# Patient Record
Sex: Female | Born: 1935
Health system: Southern US, Community
[De-identification: ages and names within clinical notes are randomized; demographics above are authoritative.]

## PROBLEM LIST (undated history)

## (undated) DIAGNOSIS — I4891 Unspecified atrial fibrillation: Secondary | ICD-10-CM

## (undated) DIAGNOSIS — I499 Cardiac arrhythmia, unspecified: Secondary | ICD-10-CM

## (undated) DIAGNOSIS — M1990 Unspecified osteoarthritis, unspecified site: Secondary | ICD-10-CM

## (undated) DIAGNOSIS — I639 Cerebral infarction, unspecified: Secondary | ICD-10-CM

## (undated) DIAGNOSIS — I10 Essential (primary) hypertension: Secondary | ICD-10-CM

## (undated) HISTORY — DX: Essential (primary) hypertension: I10

## (undated) HISTORY — PX: APPENDECTOMY: SHX54

## (undated) HISTORY — PX: CHOLECYSTECTOMY: SHX55

---

## 2001-05-15 ENCOUNTER — Encounter: Payer: Self-pay | Admitting: Vascular Surgery

## 2001-05-20 ENCOUNTER — Encounter (INDEPENDENT_AMBULATORY_CARE_PROVIDER_SITE_OTHER): Payer: Self-pay | Admitting: Specialist

## 2001-05-20 ENCOUNTER — Ambulatory Visit (HOSPITAL_COMMUNITY): Admission: RE | Admit: 2001-05-20 | Discharge: 2001-05-20 | Payer: Self-pay | Admitting: Vascular Surgery

## 2001-07-22 ENCOUNTER — Ambulatory Visit (HOSPITAL_COMMUNITY): Admission: RE | Admit: 2001-07-22 | Discharge: 2001-07-22 | Payer: Self-pay | Admitting: Internal Medicine

## 2001-07-22 ENCOUNTER — Encounter: Payer: Self-pay | Admitting: Internal Medicine

## 2001-08-23 ENCOUNTER — Encounter: Payer: Self-pay | Admitting: Emergency Medicine

## 2001-08-23 ENCOUNTER — Inpatient Hospital Stay (HOSPITAL_COMMUNITY): Admission: EM | Admit: 2001-08-23 | Discharge: 2001-08-25 | Payer: Self-pay | Admitting: Emergency Medicine

## 2002-02-12 ENCOUNTER — Ambulatory Visit (HOSPITAL_COMMUNITY): Admission: RE | Admit: 2002-02-12 | Discharge: 2002-02-12 | Payer: Self-pay | Admitting: Unknown Physician Specialty

## 2002-02-12 ENCOUNTER — Encounter: Payer: Self-pay | Admitting: Internal Medicine

## 2002-04-22 ENCOUNTER — Emergency Department (HOSPITAL_COMMUNITY): Admission: EM | Admit: 2002-04-22 | Discharge: 2002-04-22 | Payer: Self-pay | Admitting: Emergency Medicine

## 2002-09-22 ENCOUNTER — Encounter: Payer: Self-pay | Admitting: Internal Medicine

## 2002-09-22 ENCOUNTER — Ambulatory Visit (HOSPITAL_COMMUNITY): Admission: RE | Admit: 2002-09-22 | Discharge: 2002-09-22 | Payer: Self-pay | Admitting: Internal Medicine

## 2004-01-04 ENCOUNTER — Ambulatory Visit (HOSPITAL_COMMUNITY): Admission: RE | Admit: 2004-01-04 | Discharge: 2004-01-04 | Payer: Self-pay | Admitting: Internal Medicine

## 2004-06-13 ENCOUNTER — Ambulatory Visit (HOSPITAL_COMMUNITY): Admission: RE | Admit: 2004-06-13 | Discharge: 2004-06-13 | Payer: Self-pay | Admitting: Internal Medicine

## 2005-04-16 ENCOUNTER — Ambulatory Visit (HOSPITAL_COMMUNITY): Admission: RE | Admit: 2005-04-16 | Discharge: 2005-04-16 | Payer: Self-pay | Admitting: Internal Medicine

## 2006-04-08 ENCOUNTER — Ambulatory Visit (HOSPITAL_COMMUNITY): Admission: RE | Admit: 2006-04-08 | Discharge: 2006-04-08 | Payer: Self-pay | Admitting: Internal Medicine

## 2006-10-14 ENCOUNTER — Ambulatory Visit (HOSPITAL_COMMUNITY): Admission: RE | Admit: 2006-10-14 | Discharge: 2006-10-14 | Payer: Self-pay | Admitting: Orthopaedic Surgery

## 2007-05-05 ENCOUNTER — Ambulatory Visit (HOSPITAL_COMMUNITY): Admission: RE | Admit: 2007-05-05 | Discharge: 2007-05-05 | Payer: Self-pay | Admitting: Internal Medicine

## 2008-04-21 ENCOUNTER — Ambulatory Visit (HOSPITAL_COMMUNITY): Admission: RE | Admit: 2008-04-21 | Discharge: 2008-04-21 | Payer: Self-pay | Admitting: Internal Medicine

## 2009-05-10 ENCOUNTER — Ambulatory Visit (HOSPITAL_COMMUNITY): Admission: RE | Admit: 2009-05-10 | Discharge: 2009-05-10 | Payer: Self-pay | Admitting: Internal Medicine

## 2010-05-11 ENCOUNTER — Ambulatory Visit (HOSPITAL_COMMUNITY): Admission: RE | Admit: 2010-05-11 | Discharge: 2010-05-11 | Payer: Self-pay | Admitting: Internal Medicine

## 2011-02-22 NOTE — Procedures (Signed)
[facility]  Patient:    Sheila Jordan, Sheila Jordan Visit Number: [id number] MRN: [id number]          Service Type: EMS Location: ED Attending Physician:  Hilario Quarry Dictated by:   Kari Baars, M.D. Proc. Date: 04/22/02 Admit Date:  04/22/2002 Discharge Date: 04/22/2002                            EKG Interpretations  TIME/DATE:  1729, April 22, 2002  DESCRIPTION:  The rhythm is sinus rhythm, rate in the 80s.  There are small Q-waves inferiorly and laterally which may indicate a previous inferolateral myocardial infarction.  Clinical correlation is suggested.  Otherwise, normal electrocardiogram. Dictated by:   Kari Baars, M.D. Attending Physician:  Hilario Quarry DD:  04/24/02 TD:  04/28/02 Job: [id number]

## 2011-02-22 NOTE — Discharge Summary (Signed)
[facility]  Patient:    Sheila Jordan, Sheila Jordan Visit Number: [id number] MRN: [id number]          Service Type: MED Location: 2A A219 01 Attending Physician:  Renne Musca Dictated by:   Renne Musca, M.D. Admit Date:  08/23/2001 Discharge Date: 08/25/2001                             Discharge Summary  DISCHARGE DIAGNOSES: 1. Community-acquired pneumonia with hypoxemia. 2. Chronic venous stasis. 3. Costochondritis resolved.  DISPOSITION:  The patient is discharged to home.  MEDICATIONS: 1. Zithromax 250 q.d. for 5 more days. 2. Premarin 0.625 q.d. 3. Multivitamin, Vitamin C and Vitamin E.  HOSPITAL COURSE:  The patient is a pleasant 77 year old white female in good health who had noted a dry cough several days prior to admission.  She presented to the emergency room complaining of high fever, and generally feeling poorly.  She denied short of breath but did note some anterior chest pain.  She was initially started on Rocephin, remained febrile through the first 36 hours and then defervesced.  She underwent a venogram which was negative for DVT and apparently a CT of the chest negative for PE.  Chest x-ray revealed some basilar atelectasis but no obvious infiltrates or effusions.  Cardiac enzymes remained normal.  Her initial pO2 was 60 on room air with pC022 of 32. Followup blood casts on room air was 74 with a Pc022 of 35.  Her fevers defervesced.  She felt quite well and was ready for discharge on the second hospital day.  Physical examination at the time of admission did show fine rales in the right base but these have resolved as did her chest pain which was musculoskeletal in nature.  PHYSICAL EXAMINATION:  GENERAL:  Awake, white female in no distress.  VITAL SIGNS:  Temperature is 97.  Blood pressure is 127/80, pulse is 74, her abdomen is obese, soft, nontender.  LUNGS:  Clear to auscultation anterior and  posteriorly.  HEART:  Regular rate and rhythm no murmur, gallop, or rub.  No JVD is noted.  EXTREMITIES:  Without clubbing, cyanosis, or edema.  She has chronic stasis changes particularly at the left lower extremity and some deformity related to scarring and a previous ankle fracture.  NEUROLOGIC:  Completely intact.  DISPOSITION:  The patient is discharged to home in improved condition with the plan as noted above.  She is to follow up in the office in 2-3 weeks.  She is also to obtain a bone density study. Dictated by:   Renne Musca, M.D. Attending Physician:  Renne Musca DD:  08/25/01 TD:  08/25/01 Job: [id number]

## 2011-02-22 NOTE — Op Note (Signed)
NAME:  Sheila Jordan, Sheila Jordan                        ACCOUNT NO.:  [id number]   MEDICAL RECORD NO.:  [id number]                   PATIENT TYPE:  AMB   LOCATION:  DAY                                  FACILITY:  [facility]   PHYSICIAN:  R. Roetta Sessions, M.D.              DATE OF BIRTH:  1928/07/21   DATE OF PROCEDURE:  06/13/2004  DATE OF DISCHARGE:                                 OPERATIVE REPORT   PROCEDURE:  Screening colonoscopy.   INDICATIONS FOR PROCEDURE:  The patient is a 77 year old lady sent at the  courtesy of Dr. Ouida Sills for colorectal cancer. She has never had a colonoscopy  or other imaging modality. No GI symptoms. No family history of colorectal  neoplasia. Colonoscopy is now being done as a standard screening maneuver.  This approach has been discussed with the patient at length. Potential  risks, benefits, and alternatives have been reviewed and questions answered.  She is agreeable. Please see my hand written H&P for more information.   PROCEDURE NOTE:  O2 saturation, blood pressure, pulse, and respirations  monitored throughout the entirety of the procedure. Conscious sedation with  Versed 3 mg IV and Demerol 75 mg IV in divided doses.   INSTRUMENT:  Olympus video chip system.   FINDINGS:  Digital rectal examination revealed no abnormalities.   ENDOSCOPIC FINDINGS:  Prep was good.   Rectum:  Examination of the rectal mucosa including retroflexed view of anal  verge revealed no abnormalities.   Colon:  Colonic mucosa was surveyed from the rectosigmoid junction through  the left, transverse, and right colon to area of the appendiceal orifice,  ileocecal valve, and cecum. These structures were well seen and photographed  for the record. Olympus video scope was slowly withdrawn, and all previously  mentioned mucosal surfaces were again seen. The patient had a tortuous,  elongated colon recurring a number of maneuvers including various  applications of external abdominal  pressure and changing of the patient's  position to reach the cecum. There were some early diverticular changes of  the left colon. Otherwise, colonic mucosa appeared normal. The patient  tolerated the procedure well and was reactive to endoscopy.   IMPRESSION:  1.  Normal rectum.  2.  Minimal left sided diverticular changes, otherwise normal colon, aside      from tortuosity and elongation.   RECOMMENDATIONS:  Consider repeat colonoscopy in 10 years if she remains in  good health. Follow up with Dr. Ouida Sills as needed.      ___________________________________________                                            Jonathon Bellows, M.D.   RMR/MEDQ  D:  06/13/2004  T:  06/13/2004  Job:  [id number]   cc:   Kingsley Callander. Ouida Sills, M.D.  [address]  [place]  [place] [address]  Fax: [phone]
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
## 2011-02-22 NOTE — Op Note (Signed)
Buehrer, Reanna   [place]                                  MRN:  [id number]           Operative Report                                  ATT:  Quita Skye. Hart Rochester, M.D.  Procedure:  05/20/01             DICT: PREOPERATIVE DIAGNOSIS:  Severe varicose veins with venous hypertension, left leg, and venous stasis ulceration.  POSTOPERATIVE DIAGNOSIS:  Severe varicose veins with venous hypertension, left leg, and venous stasis ulceration.  OPERATION:  Ligation and stripping of greater saphenous vein, left leg and excision of multiple varicosities.  SURGEON:  Quita Skye. Hart Rochester, M.D.  FIRST ASSISTANT:  Gina H. Collins, P.A.-C.  ANESTHESIA:  General endotracheal.  DESCRIPTION OF PROCEDURE:  The patient was taken to the operating room and placed in the supine position, at which time satisfactory general endotracheal anesthesia was administered after the varicosities in the left leg had been marked with the patient in the upright position.  There was a large healed stasis ulceration with hyperpigmentation proximal to the medial malleolus, with the skin changes  measuring about 6-7 cm in diameter, but no active infection or ulceration.  The leg was prepped with Betadine solution and draped in a routine sterile manner.  An incision was made initially in the saphenofemoral junction with an oblique incision in the inguinal crease.  The saphenous vein was dissected free up to the saphenofemoral junction.  The vein was ligated with 2-0 silk ties.  Branches were ligated with hemoclips.  An intraluminal stripper was passed retrograde from the proximal saphenous vein until it was palpated in the mid calf, where it would no longer proceed.  A short incision was made at this point and the greater saphenous vein was exposed.  The distal vein was removed by avulsion technique down to the area just above the active area of hyperpigmentation using another  small counter incision.  Care was taken not to injure the saphenous nerve.  The small stripper head was then positioned in the inguinal region and the marked varicosities were all removed using stab wounds with hemostats and dissection and avulsion techniques.  When this had all been completed, the  leg was wrapped with 6-inch Ace.  The patient was placed in Trendelenburg and the vein stripped from proximal to distal in a routine fashion.  Adequate compression was applied for five minutes.  The wound was closed with Vicryl in a subcuticular fashion with Steri-Strips.  Sterile dressings were applied consisting of 4 x 4, Webril and Ace wrap.  The patient was taken to the recovery room in satisfactory condition. DD:  05/20/01 TD:  05/20/01 Job: [id number]

## 2011-05-21 ENCOUNTER — Other Ambulatory Visit (HOSPITAL_COMMUNITY): Payer: Self-pay | Admitting: Internal Medicine

## 2011-05-21 DIAGNOSIS — Z1239 Encounter for other screening for malignant neoplasm of breast: Secondary | ICD-10-CM

## 2011-05-21 DIAGNOSIS — Z139 Encounter for screening, unspecified: Secondary | ICD-10-CM

## 2011-05-22 ENCOUNTER — Other Ambulatory Visit (HOSPITAL_COMMUNITY): Payer: Self-pay | Admitting: Internal Medicine

## 2011-05-22 DIAGNOSIS — Z139 Encounter for screening, unspecified: Secondary | ICD-10-CM

## 2011-05-24 ENCOUNTER — Ambulatory Visit (HOSPITAL_COMMUNITY)
Admission: RE | Admit: 2011-05-24 | Discharge: 2011-05-24 | Disposition: A | Discharge status: Home / Self Care | Payer: Medicare Other | Source: Ambulatory Visit | Attending: Internal Medicine | Admitting: Internal Medicine

## 2011-05-24 ENCOUNTER — Ambulatory Visit (HOSPITAL_COMMUNITY): Payer: Medicare Other

## 2011-05-24 DIAGNOSIS — Z139 Encounter for screening, unspecified: Secondary | ICD-10-CM

## 2011-05-24 DIAGNOSIS — Z1231 Encounter for screening mammogram for malignant neoplasm of breast: Secondary | ICD-10-CM | POA: Insufficient documentation

## 2011-07-01 ENCOUNTER — Other Ambulatory Visit (HOSPITAL_COMMUNITY): Payer: Self-pay | Admitting: Internal Medicine

## 2011-07-01 DIAGNOSIS — M8580 Other specified disorders of bone density and structure, unspecified site: Secondary | ICD-10-CM

## 2011-07-01 DIAGNOSIS — Z8781 Personal history of (healed) traumatic fracture: Secondary | ICD-10-CM

## 2011-07-04 ENCOUNTER — Ambulatory Visit (HOSPITAL_COMMUNITY)
Admission: RE | Admit: 2011-07-04 | Discharge: 2011-07-04 | Disposition: A | Discharge status: Home / Self Care | Payer: Medicare Other | Source: Ambulatory Visit | Attending: Internal Medicine | Admitting: Internal Medicine

## 2011-07-04 DIAGNOSIS — M899 Disorder of bone, unspecified: Secondary | ICD-10-CM | POA: Insufficient documentation

## 2011-07-04 DIAGNOSIS — M8580 Other specified disorders of bone density and structure, unspecified site: Secondary | ICD-10-CM

## 2011-07-04 DIAGNOSIS — Z8781 Personal history of (healed) traumatic fracture: Secondary | ICD-10-CM

## 2011-07-04 DIAGNOSIS — Z780 Asymptomatic menopausal state: Secondary | ICD-10-CM | POA: Insufficient documentation

## 2012-05-11 ENCOUNTER — Other Ambulatory Visit (HOSPITAL_COMMUNITY): Payer: Self-pay | Admitting: Internal Medicine

## 2012-05-11 DIAGNOSIS — Z139 Encounter for screening, unspecified: Secondary | ICD-10-CM

## 2012-05-26 ENCOUNTER — Ambulatory Visit (HOSPITAL_COMMUNITY)
Admission: RE | Admit: 2012-05-26 | Discharge: 2012-05-26 | Disposition: A | Discharge status: Home / Self Care | Payer: Medicare Other | Source: Ambulatory Visit | Attending: Internal Medicine | Admitting: Internal Medicine

## 2012-05-26 ENCOUNTER — Ambulatory Visit (HOSPITAL_COMMUNITY): Payer: Medicare Other

## 2012-05-26 DIAGNOSIS — Z139 Encounter for screening, unspecified: Secondary | ICD-10-CM

## 2012-05-26 DIAGNOSIS — Z1231 Encounter for screening mammogram for malignant neoplasm of breast: Secondary | ICD-10-CM | POA: Insufficient documentation

## 2013-06-16 ENCOUNTER — Other Ambulatory Visit (HOSPITAL_COMMUNITY): Payer: Self-pay | Admitting: Internal Medicine

## 2013-06-16 DIAGNOSIS — Z139 Encounter for screening, unspecified: Secondary | ICD-10-CM

## 2013-06-18 ENCOUNTER — Ambulatory Visit (HOSPITAL_COMMUNITY)
Admission: RE | Admit: 2013-06-18 | Discharge: 2013-06-18 | Disposition: A | Discharge status: Home / Self Care | Payer: Medicare Other | Source: Ambulatory Visit | Attending: Internal Medicine | Admitting: Internal Medicine

## 2013-06-18 DIAGNOSIS — Z139 Encounter for screening, unspecified: Secondary | ICD-10-CM

## 2013-06-18 DIAGNOSIS — Z1231 Encounter for screening mammogram for malignant neoplasm of breast: Secondary | ICD-10-CM | POA: Insufficient documentation

## 2013-08-26 ENCOUNTER — Other Ambulatory Visit (HOSPITAL_COMMUNITY): Payer: Self-pay | Admitting: Internal Medicine

## 2013-08-26 ENCOUNTER — Ambulatory Visit (HOSPITAL_COMMUNITY)
Admission: RE | Admit: 2013-08-26 | Discharge: 2013-08-26 | Disposition: A | Discharge status: Home / Self Care | Payer: Medicare Other | Source: Ambulatory Visit | Attending: Internal Medicine | Admitting: Internal Medicine

## 2013-08-26 DIAGNOSIS — R059 Cough, unspecified: Secondary | ICD-10-CM

## 2013-08-26 DIAGNOSIS — R05 Cough: Secondary | ICD-10-CM | POA: Insufficient documentation

## 2014-09-20 ENCOUNTER — Other Ambulatory Visit (HOSPITAL_COMMUNITY): Payer: Self-pay | Admitting: Internal Medicine

## 2014-09-20 DIAGNOSIS — Z1231 Encounter for screening mammogram for malignant neoplasm of breast: Secondary | ICD-10-CM

## 2014-09-22 ENCOUNTER — Ambulatory Visit (HOSPITAL_COMMUNITY)
Admission: RE | Admit: 2014-09-22 | Discharge: 2014-09-22 | Disposition: A | Discharge status: Home / Self Care | Payer: Medicare Other | Source: Ambulatory Visit | Attending: Internal Medicine | Admitting: Internal Medicine

## 2014-09-22 DIAGNOSIS — Z1231 Encounter for screening mammogram for malignant neoplasm of breast: Secondary | ICD-10-CM | POA: Diagnosis not present

## 2015-09-05 ENCOUNTER — Other Ambulatory Visit (HOSPITAL_COMMUNITY): Payer: Self-pay | Admitting: Internal Medicine

## 2015-09-05 DIAGNOSIS — Z1231 Encounter for screening mammogram for malignant neoplasm of breast: Secondary | ICD-10-CM

## 2015-09-07 ENCOUNTER — Ambulatory Visit (HOSPITAL_COMMUNITY)
Admission: RE | Admit: 2015-09-07 | Discharge: 2015-09-07 | Disposition: A | Discharge status: Home / Self Care | Payer: Medicare Other | Source: Ambulatory Visit | Attending: Internal Medicine | Admitting: Internal Medicine

## 2015-09-07 DIAGNOSIS — Z1231 Encounter for screening mammogram for malignant neoplasm of breast: Secondary | ICD-10-CM | POA: Insufficient documentation

## 2016-09-05 ENCOUNTER — Other Ambulatory Visit (HOSPITAL_COMMUNITY): Payer: Self-pay | Admitting: Internal Medicine

## 2016-09-05 DIAGNOSIS — Z1231 Encounter for screening mammogram for malignant neoplasm of breast: Secondary | ICD-10-CM

## 2016-09-18 ENCOUNTER — Ambulatory Visit (HOSPITAL_COMMUNITY)
Admission: RE | Admit: 2016-09-18 | Discharge: 2016-09-18 | Disposition: A | Discharge status: Home / Self Care | Payer: Medicare Other | Source: Ambulatory Visit | Attending: Internal Medicine | Admitting: Internal Medicine

## 2016-09-18 DIAGNOSIS — Z1231 Encounter for screening mammogram for malignant neoplasm of breast: Secondary | ICD-10-CM | POA: Diagnosis not present

## 2017-08-27 ENCOUNTER — Encounter (HOSPITAL_COMMUNITY): Payer: Self-pay

## 2017-08-27 ENCOUNTER — Emergency Department (HOSPITAL_COMMUNITY): Payer: Medicare Other

## 2017-08-27 ENCOUNTER — Emergency Department (HOSPITAL_COMMUNITY)
Admission: EM | Admit: 2017-08-27 | Discharge: 2017-08-27 | Disposition: A | Discharge status: Home / Self Care | Payer: Medicare Other | Attending: Emergency Medicine | Admitting: Emergency Medicine

## 2017-08-27 DIAGNOSIS — K449 Diaphragmatic hernia without obstruction or gangrene: Secondary | ICD-10-CM | POA: Diagnosis not present

## 2017-08-27 DIAGNOSIS — Z79899 Other long term (current) drug therapy: Secondary | ICD-10-CM | POA: Diagnosis not present

## 2017-08-27 DIAGNOSIS — R0789 Other chest pain: Secondary | ICD-10-CM | POA: Diagnosis not present

## 2017-08-27 HISTORY — DX: Unspecified osteoarthritis, unspecified site: M19.90

## 2017-08-27 LAB — CBC WITH DIFFERENTIAL/PLATELET
Basophils Absolute: 0 10*3/uL (ref 0.0–0.1)
Basophils Relative: 0 %
Eosinophils Absolute: 0.1 10*3/uL (ref 0.0–0.7)
Eosinophils Relative: 2 %
HCT: 38.2 % (ref 36.0–46.0)
HEMOGLOBIN: 12.3 g/dL (ref 12.0–15.0)
Lymphocytes Relative: 17 %
Lymphs Abs: 1.2 10*3/uL (ref 0.7–4.0)
MCH: 28.8 pg (ref 26.0–34.0)
MCHC: 32.2 g/dL (ref 30.0–36.0)
MCV: 89.5 fL (ref 78.0–100.0)
Monocytes Absolute: 0.5 10*3/uL (ref 0.1–1.0)
Monocytes Relative: 7 %
NEUTROS ABS: 5.1 10*3/uL (ref 1.7–7.7)
NEUTROS PCT: 74 %
Platelets: 235 10*3/uL (ref 150–400)
RBC: 4.27 MIL/uL (ref 3.87–5.11)
RDW: 13.7 % (ref 11.5–15.5)
WBC: 6.8 10*3/uL (ref 4.0–10.5)

## 2017-08-27 LAB — COMPREHENSIVE METABOLIC PANEL
ALK PHOS: 81 U/L (ref 38–126)
ALT: 12 U/L — AB (ref 14–54)
AST: 18 U/L (ref 15–41)
Albumin: 4 g/dL (ref 3.5–5.0)
Anion gap: 8 (ref 5–15)
BILIRUBIN TOTAL: 0.8 mg/dL (ref 0.3–1.2)
BUN: 25 mg/dL — ABNORMAL HIGH (ref 6–20)
CALCIUM: 8.8 mg/dL — AB (ref 8.9–10.3)
CO2: 26 mmol/L (ref 22–32)
CREATININE: 1.14 mg/dL — AB (ref 0.44–1.00)
Chloride: 104 mmol/L (ref 101–111)
GFR, EST AFRICAN AMERICAN: 49 mL/min — AB (ref >60)
GFR, EST NON AFRICAN AMERICAN: 42 mL/min — AB (ref >60)
Glucose, Bld: 122 mg/dL — ABNORMAL HIGH (ref 65–99)
Potassium: 3.3 mmol/L — ABNORMAL LOW (ref 3.5–5.1)
Sodium: 138 mmol/L (ref 135–145)
TOTAL PROTEIN: 7 g/dL (ref 6.5–8.1)

## 2017-08-27 LAB — D-DIMER, QUANTITATIVE: D-Dimer, Quant: 1.5 ug/mL-FEU — ABNORMAL HIGH (ref 0.00–0.50)

## 2017-08-27 LAB — TROPONIN I

## 2017-08-27 MED ORDER — HYDROCODONE-ACETAMINOPHEN 5-325 MG PO TABS
1.0000 | ORAL_TABLET | Freq: Once | ORAL | Status: AC
  Administered 2017-08-27: 1 via ORAL
  Filled 2017-08-27: qty 1

## 2017-08-27 MED ORDER — FENTANYL CITRATE (PF) 100 MCG/2ML IJ SOLN
50.0000 ug | Freq: Once | INTRAMUSCULAR | Status: AC
  Administered 2017-08-27: 50 ug via INTRAVENOUS
  Filled 2017-08-27: qty 2

## 2017-08-27 MED ORDER — LANSOPRAZOLE 15 MG PO CPDR: 15.0000 mg | DELAYED_RELEASE_CAPSULE | Freq: Every day | ORAL | 0 refills | Status: DC

## 2017-08-27 MED ORDER — OXYCODONE-ACETAMINOPHEN 5-325 MG PO TABS: 1.0000 | ORAL_TABLET | ORAL | 0 refills | Status: DC | PRN

## 2017-08-27 MED ORDER — FENTANYL CITRATE (PF) 100 MCG/2ML IJ SOLN
50.0000 ug | Freq: Once | INTRAMUSCULAR | Status: AC
  Administered 2017-08-27: 50 ug via INTRAVENOUS

## 2017-08-27 MED ORDER — OXYCODONE-ACETAMINOPHEN 5-325 MG PO TABS
1.0000 | ORAL_TABLET | Freq: Once | ORAL | Status: AC
  Administered 2017-08-27: 1 via ORAL
  Filled 2017-08-27: qty 1

## 2017-08-27 MED ORDER — OXYCODONE-ACETAMINOPHEN 5-325 MG PO TABS: 2.0000 | ORAL_TABLET | Freq: Four times a day (QID) | ORAL | 0 refills | Status: DC | PRN

## 2017-08-27 MED ORDER — IOPAMIDOL (ISOVUE-370) INJECTION 76%
75.0000 mL | Freq: Once | INTRAVENOUS | Status: AC | PRN
  Administered 2017-08-27: 21:00:00 via INTRAVENOUS

## 2017-08-27 MED ORDER — GI COCKTAIL ~~LOC~~
30.0000 mL | Freq: Once | ORAL | Status: AC
  Administered 2017-08-27: 30 mL via ORAL
  Filled 2017-08-27: qty 30

## 2017-08-27 NOTE — ED Notes (Signed)
Pt was taken home by family. Pt did not drive.

## 2017-08-27 NOTE — ED Notes (Signed)
Pt back from CT

## 2017-08-27 NOTE — ED Provider Notes (Signed)
[facility] EMERGENCY DEPARTMENT Provider Note   CSN: [id number] Arrival date & time: 08/27/17  1723     History   Chief Complaint Chief Complaint  Patient presents with  . Chest Pain    HPI Sheila Jordan is a 83 y.o. female.  HPI Yesterday she was starting her lawnmower and heard a popping sound in her chest.  No immediate pain.  States that she has been active all day today.  Around noon began having inferior sternal pain.  Describes the pain is sharp and worse with movement and deep breathing.  Denies cough or shortness of breath.  No fever or chills.  She has chronic lower extremity swelling which is unchanged.  Denies any abdominal pain.  No nausea or vomiting. Past Medical History:  Diagnosis Date  . Arthritis     There are no active problems to display for this patient.   History reviewed. No pertinent surgical history.  OB History    No data available       Home Medications    Prior to Admission medications   Medication Sig Start Date End Date Taking? Authorizing Provider  chlorthalidone (HYGROTON) 25 MG tablet Take 25 mg by mouth every morning.   Yes [provider]  lansoprazole (PREVACID) 15 MG capsule Take 1 capsule (15 mg total) by mouth daily at 12 noon. 08/27/17   Loren RacerYelverton, Namon Villarin, MD  oxyCODONE-acetaminophen (PERCOCET) 5-325 MG tablet Take 2 tablets by mouth every 6 (six) hours as needed for severe pain. 08/27/17   Loren RacerYelverton, Altair Appenzeller, MD    Family History No family history on file.  Social History Social History   Tobacco Use  . Smoking status: Not on file  Substance Use Topics  . Alcohol use: No    Frequency: Never  . Drug use: No     Allergies   Patient has no known allergies.   Review of Systems Review of Systems  Constitutional: Negative for chills and fever.  Respiratory: Negative for cough and shortness of breath.   Cardiovascular: Positive for chest pain and leg swelling. Negative for palpitations.    Gastrointestinal: Negative for abdominal pain, constipation, diarrhea, nausea and vomiting.  Musculoskeletal: Negative for back pain, myalgias, neck pain and neck stiffness.  Skin: Negative for rash and wound.  Neurological: Negative for dizziness, weakness, light-headedness, numbness and headaches.  All other systems reviewed and are negative.    Physical Exam Updated Vital Signs BP (!) 178/104   Pulse 81   Temp 97.8 F (36.6 C) (Oral)   Resp (!) 21   Ht 5\' 4"  (1.626 m)   Wt 79.8 kg (176 lb)   SpO2 96%   BMI 30.21 kg/m   Physical Exam  Constitutional: She is oriented to person, place, and time. She appears well-developed and well-nourished.  Non-toxic appearance. She does not appear ill. She appears distressed.  HENT:  Head: Normocephalic and atraumatic.  Mouth/Throat: Oropharynx is clear and moist.  Eyes: EOM are normal. Pupils are equal, round, and reactive to light.  Neck: Normal range of motion. Neck supple.  Cardiovascular: Normal rate, regular rhythm and intact distal pulses.  Pulmonary/Chest: Effort normal and breath sounds normal.  Abdominal: Soft. Bowel sounds are normal. There is no tenderness. There is no rebound and no guarding.  Musculoskeletal: Normal range of motion. She exhibits no tenderness.       Right lower leg: She exhibits edema. She exhibits no tenderness.       Left lower leg: She exhibits no  tenderness and no edema.  Patient has tenderness to palpation over the inferior sternum and the right sternal border.  There is no crepitance or deformity.  She has right lower extremity swelling compared to left.  No calf tenderness.  Distal pulses are 2+.  Neurological: She is alert and oriented to person, place, and time.  Moves all extremities without focal deficit.  Sensation fully intact.  Skin: Skin is warm and dry. Capillary refill takes less than 2 seconds. No rash noted. No erythema.  Psychiatric: She has a normal mood and affect. Her behavior is normal.   Nursing note and vitals reviewed.    ED Treatments / Results  Labs (all labs ordered are listed, but only abnormal results are displayed) Labs Reviewed  COMPREHENSIVE METABOLIC PANEL - Abnormal; Notable for the following components:      Result Value   Potassium 3.3 (*)    Glucose, Bld 122 (*)    BUN 25 (*)    Creatinine, Ser 1.14 (*)    Calcium 8.8 (*)    ALT 12 (*)    GFR calc non Af Amer 42 (*)    GFR calc Af Amer 49 (*)    All other components within normal limits  D-DIMER, QUANTITATIVE (NOT AT [facility]) - Abnormal; Notable for the following components:   D-Dimer, Quant 1.50 (*)    All other components within normal limits  CBC WITH DIFFERENTIAL/PLATELET  TROPONIN I    EKG  EKG Interpretation None       Radiology Dg Chest 2 View  Result Date: 08/27/2017 CLINICAL DATA:  Sternal chest pain. EXAM: CHEST  2 VIEW COMPARISON:  08/26/2013 FINDINGS: Heart size upper normal, likely normal for technique. Mild tortuosity of the aorta, advanced aortic atherosclerosis. Ill-defined opacity at the right lung base localizes to the right middle lobe on the lateral view. Streaky left basilar atelectasis. No pulmonary edema. No definite pleural effusion or pneumothorax. Mild compression deformity in the midthoracic spine is unchanged from prior. The bones are under mineralized. IMPRESSION: 1. Right middle lobe opacity may reflect lobar atelectasis or pneumonia in the appropriate clinical setting. Recommend radiographic follow-up in 3-4 weeks after course of treatment to ensure resolution. 2.  Aortic Atherosclerosis (ICD10-I70.0). Electronically Signed   By: Rubye OaksMelanie  Ehinger M.D.   On: 08/27/2017 18:38   Ct Angio Chest Pe W And/or Wo Contrast  Result Date: 08/27/2017 CLINICAL DATA:  Midsternal chest pain EXAM: CT ANGIOGRAPHY CHEST WITH CONTRAST TECHNIQUE: Multidetector CT imaging of the chest was performed using the standard protocol during bolus administration of intravenous contrast.  Multiplanar CT image reconstructions and MIPs were obtained to evaluate the vascular anatomy. CONTRAST:  75 cc ISOVUE-370 IOPAMIDOL (ISOVUE-370) INJECTION 76% COMPARISON:  CT report 08/23/2001, CXR 08/27/2017, CXR 08/26/2013 FINDINGS: Cardiovascular: Heart size is top normal with coronary arteriosclerosis and aortic atherosclerosis. No acute pulmonary embolus, aortic aneurysm or dissection. Mediastinum/Nodes: No enlarged mediastinal, hilar, or axillary lymph nodes. Fluid-filled upper esophagus may be secondary to reflux. There is a small to moderate-sized hiatal hernia. Patent trachea and mainstem bronchi. The thyroid gland is slightly heterogeneous in appearance and there appears to be a tiny 6 mm hypodense nodule in the right thyroid gland. Lungs/Pleura: Dependent atelectasis. No pneumonic consolidation, effusion or pneumothorax. Upper Abdomen: No acute abnormality. Musculoskeletal: Lower thoracic spondylosis with chronic mild compression of the T7 vertebral body. Review of the MIP images confirms the above findings. IMPRESSION: 1. Small to moderate-sized hiatal hernia. Small amount of fluid in the upper esophagus may  be secondary to reflux accounting for the patient's midsternal chest pain. 2. Borderline cardiomegaly with coronary arteriosclerosis. 3. No acute pulmonary embolus. 4. Lower thoracic spondylosis with chronic mild compression of what appears to be the T7 vertebral body. Aortic Atherosclerosis (ICD10-I70.0). Electronically Signed   By: Tollie Eth M.D.   On: 08/27/2017 21:54    Procedures Procedures (including critical care time)  Medications Ordered in ED Medications  gi cocktail (Maalox,Lidocaine,Donnatal) (not administered)  oxyCODONE-acetaminophen (PERCOCET/ROXICET) 5-325 MG per tablet 1 tablet (not administered)  HYDROcodone-acetaminophen (NORCO/VICODIN) 5-325 MG per tablet 1 tablet (1 tablet Oral Given 08/27/17 1805)  fentaNYL (SUBLIMAZE) injection 50 mcg (50 mcg Intravenous Given  08/27/17 2021)  iopamidol (ISOVUE-370) 76 % injection 75 mL ( Intravenous Contrast Given 08/27/17 2125)  fentaNYL (SUBLIMAZE) injection 50 mcg (50 mcg Intravenous Given 08/27/17 2127)     Initial Impression / Assessment and Plan / ED Course  I have reviewed the triage vital signs and the nursing notes.  Pertinent labs & imaging results that were available during my care of the patient were reviewed by me and considered in my medical decision making (see chart for details).     No evidence of PE. Pain has improved with medication. Some evidence of hiatal hernia and reflux on CT. Will start on PPI. Return precautions given.   Final Clinical Impressions(s) / ED Diagnoses   Final diagnoses:  Chest wall pain  Hiatal hernia    ED Discharge Orders        Ordered    oxyCODONE-acetaminophen (PERCOCET) 5-325 MG tablet  Every 6 hours PRN     08/27/17 2211    lansoprazole (PREVACID) 15 MG capsule  Daily     08/27/17 2211       Loren Racer, MD 08/27/17 2212

## 2017-08-27 NOTE — ED Triage Notes (Addendum)
Pt complaining of mid sternal chest pain close to the xiphoid process. Pain on palpation and inspiration. Was bending over to fix lawn mower yesterday when she heard a popping noise upon standing up.  Pt has taken 4 81mg  apirin prior to arrival.    NS on monitor CBG 125 BP 128/87 HR 79 R 22  97% RA  NAD

## 2017-09-01 MED FILL — Oxycodone w/ Acetaminophen Tab 5-325 MG: ORAL | Qty: 6 | Status: AC

## 2018-02-04 ENCOUNTER — Other Ambulatory Visit (HOSPITAL_COMMUNITY): Payer: Self-pay | Admitting: Internal Medicine

## 2018-02-04 DIAGNOSIS — Z1231 Encounter for screening mammogram for malignant neoplasm of breast: Secondary | ICD-10-CM

## 2018-02-05 ENCOUNTER — Ambulatory Visit (HOSPITAL_COMMUNITY)
Admission: RE | Admit: 2018-02-05 | Discharge: 2018-02-05 | Disposition: A | Discharge status: Home / Self Care | Payer: Medicare Other | Source: Ambulatory Visit | Attending: Internal Medicine | Admitting: Internal Medicine

## 2018-02-05 DIAGNOSIS — Z1231 Encounter for screening mammogram for malignant neoplasm of breast: Secondary | ICD-10-CM | POA: Diagnosis not present

## 2019-03-18 ENCOUNTER — Other Ambulatory Visit (HOSPITAL_COMMUNITY): Payer: Self-pay | Admitting: Internal Medicine

## 2019-03-18 DIAGNOSIS — Z1231 Encounter for screening mammogram for malignant neoplasm of breast: Secondary | ICD-10-CM

## 2019-05-26 ENCOUNTER — Telehealth: Payer: Self-pay | Admitting: Obstetrics and Gynecology

## 2019-05-26 NOTE — Telephone Encounter (Signed)

## 2019-05-27 ENCOUNTER — Ambulatory Visit: Payer: Medicare Other | Admitting: Obstetrics and Gynecology

## 2019-05-27 ENCOUNTER — Encounter: Payer: Self-pay | Admitting: Obstetrics and Gynecology

## 2019-05-27 ENCOUNTER — Other Ambulatory Visit: Payer: Self-pay

## 2019-05-27 VITALS — Ht 63.0 in | Wt 172.0 lb

## 2019-05-27 DIAGNOSIS — L209 Atopic dermatitis, unspecified: Secondary | ICD-10-CM

## 2019-05-27 MED ORDER — FLUCONAZOLE 150 MG PO TABS: 150.0000 mg | ORAL_TABLET | ORAL | 1 refills | Status: DC

## 2019-05-27 MED ORDER — CLOBETASOL PROPIONATE 0.05 % EX CREA: TOPICAL_CREAM | Freq: Two times a day (BID) | CUTANEOUS | Status: AC

## 2019-05-27 NOTE — Progress Notes (Addendum)
Patient ID: Sheila Jordan, female   DOB: 11/22/1927, 85 y.o.   MRN: [id number]    [facility] Visit  @DATE@            Patient name: Sheila Jordan MRN [id number]  Date of birth: 02/22/1928  CC & HPI:  Sheila Jordan is a 85 y.o. female presenting today for vaginal irritation. Only relief is sitting in tub of "soda water" 3-4 times a day. Sx started 10 days ago. Itching will start and she will get in the bath, then itching will subside for 2-3 hours. Denies being anywhere with bugs, no changes in diet, has no known drug allergy  ROS:  ROS +genital irritation w/ erythema, scaling and oozing from symmetric lesions  Pertinent History Reviewed:   Reviewed: Significant for Medical         Past Medical History:  Diagnosis Date  . Arthritis   . Hypertension                               Surgical Hx:   History reviewed. No pertinent surgical history. Medications: Reviewed & Updated - see associated section                       Current Outpatient Medications:  .  chlorthalidone (HYGROTON) 25 MG tablet, Take 25 mg by mouth every morning., Disp: , Rfl:    Social History: Reviewed -  reports that she has never smoked. She has never used smokeless tobacco.  Objective Findings:  Vitals: Height 5' 3" (1.6 m), weight 172 lb (78 kg).  PHYSICAL EXAMINATION General appearance - alert, well appearing, and in no distress Mental status - alert, oriented to person, place, and time, normal mood, behavior, speech, dress, motor activity, and thought processes, affect appropriate to mood  PELVIC External genitalia - symmetric kissing lesions Vagina - not examined Cervix - not examined Uterus - not examined       Assessment & Plan:   A:  1.  Lichen atopic dermitis  P:  1.  Diflucan 150 mg q 3 d x2 2. Clobetasol bid 3.     By signing my name below, I, Mari Johnson, attest that this documentation has been prepared under the direction and in the presence of Ferguson,  John V, MD. Electronically Signed: Mari Johnson Medical Scribe. 05/27/19. 10:34 AM.  I personally performed the services described in this documentation, which was SCRIBED in my presence. The recorded information has been reviewed and considered accurate. It has been edited as necessary during review. John V Ferguson, MD     

## 2019-05-27 NOTE — Progress Notes (Signed)
Patient ID: Sheila Jordan, female   DOB: 1928/07/11, 85 y.o.   MRN: [id number]    [facility] Visit  @DATE @            Patient name: Sheila Jordan MRN [id number]  Date of birth: 11/22/1927  CC & HPI:  Sheila Jordan is a 85 y.o. female presenting today for vaginal irritation. Only relief is sitting in tub of "soda water" 3-4 times a day. Sx started 10 days ago. Itching will start and she will get in the bath, then itching will subside for 2-3 hours. Denies being anywhere with bugs, no changes in diet, has no known drug allergy  ROS:  ROS +genital irritation w/ erythema, scaling and oozing from symmetric lesions  Pertinent History Reviewed:   Reviewed: Significant for Medical         Past Medical History:  Diagnosis Date  . Arthritis   . Hypertension                               Surgical Hx:   History reviewed. No pertinent surgical history. Medications: Reviewed & Updated - see associated section                       Current Outpatient Medications:  .  chlorthalidone (HYGROTON) 25 MG tablet, Take 25 mg by mouth every morning., Disp: , Rfl:    Social History: Reviewed -  reports that she has never smoked. She has never used smokeless tobacco.  Objective Findings:  Vitals: Height 5\' 3"  (1.6 m), weight 172 lb (78 kg).  PHYSICAL EXAMINATION General appearance - alert, well appearing, and in no distress Mental status - alert, oriented to person, place, and time, normal mood, behavior, speech, dress, motor activity, and thought processes, affect appropriate to mood  PELVIC External genitalia - symmetric kissing lesions Vagina - not examined Cervix - not examined Uterus - not examined       Assessment & Plan:   A:  1.  Lichen atopic dermitis  P:  1.  Diflucan 150 mg q 3 d x2 2. Clobetasol bid 3.     By signing my name below, I, Samul Dada, attest that this documentation has been prepared under the direction and in the presence of Jonnie Kind, MD. Electronically Signed: [place]. 05/27/19. 10:34 AM.  I personally performed the services described in this documentation, which was SCRIBED in my presence. The recorded information has been reviewed and considered accurate. It has been edited as necessary during review. Jonnie Kind, MD

## 2019-05-27 NOTE — Patient Instructions (Signed)

## 2019-06-01 ENCOUNTER — Telehealth: Payer: Self-pay | Admitting: Obstetrics and Gynecology

## 2019-06-01 NOTE — Telephone Encounter (Signed)
Patient called, stated the meds Dr. Glo Herring gave her helped, but it didn't clear up the issue.  She stated he told her to call back if it didn't work.  [place]  [phone]

## 2019-08-02 ENCOUNTER — Telehealth: Payer: Self-pay | Admitting: Obstetrics and Gynecology

## 2019-08-02 MED ORDER — NYSTATIN-TRIAMCINOLONE 100000-0.1 UNIT/GM-% EX OINT: 1.0000 "application " | TOPICAL_OINTMENT | Freq: Two times a day (BID) | CUTANEOUS | 99 refills | Status: DC

## 2019-08-02 NOTE — Telephone Encounter (Signed)
Unable to reach Sheila Jordan at her home, left message that I would like to attempt another topical treatment for her irritation will call in [place].  Patient asked to call the office for details

## 2019-10-27 DIAGNOSIS — M545 Low back pain: Secondary | ICD-10-CM | POA: Diagnosis not present

## 2019-11-04 ENCOUNTER — Ambulatory Visit: Payer: Medicare Other

## 2019-11-16 ENCOUNTER — Ambulatory Visit: Payer: Medicare PPO | Attending: Internal Medicine

## 2019-11-18 ENCOUNTER — Ambulatory Visit: Payer: Medicare Other

## 2019-11-21 ENCOUNTER — Ambulatory Visit: Payer: Medicare Other

## 2019-12-01 ENCOUNTER — Ambulatory Visit: Payer: Medicare Other

## 2020-02-14 DIAGNOSIS — I10 Essential (primary) hypertension: Secondary | ICD-10-CM | POA: Diagnosis not present

## 2020-02-14 DIAGNOSIS — Z79899 Other long term (current) drug therapy: Secondary | ICD-10-CM | POA: Diagnosis not present

## 2020-02-21 DIAGNOSIS — M1990 Unspecified osteoarthritis, unspecified site: Secondary | ICD-10-CM | POA: Diagnosis not present

## 2020-02-21 DIAGNOSIS — N183 Chronic kidney disease, stage 3 unspecified: Secondary | ICD-10-CM | POA: Diagnosis not present

## 2020-02-21 DIAGNOSIS — I10 Essential (primary) hypertension: Secondary | ICD-10-CM | POA: Diagnosis not present

## 2020-05-16 ENCOUNTER — Other Ambulatory Visit (HOSPITAL_COMMUNITY): Payer: Self-pay | Admitting: Sports Medicine

## 2020-05-16 DIAGNOSIS — R5383 Other fatigue: Secondary | ICD-10-CM | POA: Diagnosis not present

## 2020-05-16 DIAGNOSIS — M810 Age-related osteoporosis without current pathological fracture: Secondary | ICD-10-CM | POA: Diagnosis not present

## 2020-05-16 DIAGNOSIS — E559 Vitamin D deficiency, unspecified: Secondary | ICD-10-CM | POA: Diagnosis not present

## 2020-05-16 DIAGNOSIS — M40204 Unspecified kyphosis, thoracic region: Secondary | ICD-10-CM | POA: Diagnosis not present

## 2020-05-16 DIAGNOSIS — M8000XA Age-related osteoporosis with current pathological fracture, unspecified site, initial encounter for fracture: Secondary | ICD-10-CM

## 2020-05-25 ENCOUNTER — Ambulatory Visit (HOSPITAL_COMMUNITY)
Admission: RE | Admit: 2020-05-25 | Discharge: 2020-05-25 | Disposition: A | Discharge status: Home / Self Care | Payer: Medicare Other | Source: Ambulatory Visit | Attending: Sports Medicine | Admitting: Sports Medicine

## 2020-05-25 ENCOUNTER — Other Ambulatory Visit: Payer: Self-pay

## 2020-05-25 DIAGNOSIS — M8000XA Age-related osteoporosis with current pathological fracture, unspecified site, initial encounter for fracture: Secondary | ICD-10-CM | POA: Insufficient documentation

## 2020-05-25 DIAGNOSIS — Z780 Asymptomatic menopausal state: Secondary | ICD-10-CM | POA: Insufficient documentation

## 2020-05-25 DIAGNOSIS — Z13820 Encounter for screening for osteoporosis: Secondary | ICD-10-CM | POA: Diagnosis not present

## 2020-06-06 DIAGNOSIS — M810 Age-related osteoporosis without current pathological fracture: Secondary | ICD-10-CM | POA: Diagnosis not present

## 2020-06-06 DIAGNOSIS — E559 Vitamin D deficiency, unspecified: Secondary | ICD-10-CM | POA: Diagnosis not present

## 2020-06-06 DIAGNOSIS — M40204 Unspecified kyphosis, thoracic region: Secondary | ICD-10-CM | POA: Diagnosis not present

## 2020-06-15 ENCOUNTER — Emergency Department (HOSPITAL_COMMUNITY): Payer: Medicare Other

## 2020-06-15 ENCOUNTER — Other Ambulatory Visit: Payer: Self-pay

## 2020-06-15 ENCOUNTER — Inpatient Hospital Stay (HOSPITAL_COMMUNITY)
Admission: EM | Admit: 2020-06-15 | Discharge: 2020-06-21 | DRG: 483 | Disposition: A | Discharge status: Home Health | Payer: Medicare Other | Attending: Internal Medicine | Admitting: Internal Medicine

## 2020-06-15 ENCOUNTER — Encounter (HOSPITAL_COMMUNITY): Payer: Self-pay

## 2020-06-15 DIAGNOSIS — M25511 Pain in right shoulder: Secondary | ICD-10-CM | POA: Diagnosis not present

## 2020-06-15 DIAGNOSIS — Z79899 Other long term (current) drug therapy: Secondary | ICD-10-CM

## 2020-06-15 DIAGNOSIS — S329XXA Fracture of unspecified parts of lumbosacral spine and pelvis, initial encounter for closed fracture: Secondary | ICD-10-CM | POA: Diagnosis not present

## 2020-06-15 DIAGNOSIS — S3210XA Unspecified fracture of sacrum, initial encounter for closed fracture: Secondary | ICD-10-CM | POA: Diagnosis not present

## 2020-06-15 DIAGNOSIS — Z20822 Contact with and (suspected) exposure to covid-19: Secondary | ICD-10-CM | POA: Diagnosis present

## 2020-06-15 DIAGNOSIS — I878 Other specified disorders of veins: Secondary | ICD-10-CM | POA: Diagnosis not present

## 2020-06-15 DIAGNOSIS — M25519 Pain in unspecified shoulder: Secondary | ICD-10-CM | POA: Diagnosis not present

## 2020-06-15 DIAGNOSIS — N1832 Chronic kidney disease, stage 3b: Secondary | ICD-10-CM | POA: Diagnosis present

## 2020-06-15 DIAGNOSIS — R0902 Hypoxemia: Secondary | ICD-10-CM | POA: Diagnosis not present

## 2020-06-15 DIAGNOSIS — S32591A Other specified fracture of right pubis, initial encounter for closed fracture: Secondary | ICD-10-CM | POA: Diagnosis present

## 2020-06-15 DIAGNOSIS — E876 Hypokalemia: Secondary | ICD-10-CM | POA: Diagnosis not present

## 2020-06-15 DIAGNOSIS — S42294A Other nondisplaced fracture of upper end of right humerus, initial encounter for closed fracture: Secondary | ICD-10-CM

## 2020-06-15 DIAGNOSIS — S42241A 4-part fracture of surgical neck of right humerus, initial encounter for closed fracture: Principal | ICD-10-CM | POA: Diagnosis present

## 2020-06-15 DIAGNOSIS — Z471 Aftercare following joint replacement surgery: Secondary | ICD-10-CM | POA: Diagnosis not present

## 2020-06-15 DIAGNOSIS — S42221A 2-part displaced fracture of surgical neck of right humerus, initial encounter for closed fracture: Secondary | ICD-10-CM | POA: Diagnosis present

## 2020-06-15 DIAGNOSIS — Z96611 Presence of right artificial shoulder joint: Secondary | ICD-10-CM | POA: Diagnosis not present

## 2020-06-15 DIAGNOSIS — S32119A Unspecified Zone I fracture of sacrum, initial encounter for closed fracture: Secondary | ICD-10-CM | POA: Diagnosis present

## 2020-06-15 DIAGNOSIS — N179 Acute kidney failure, unspecified: Secondary | ICD-10-CM | POA: Diagnosis not present

## 2020-06-15 DIAGNOSIS — G588 Other specified mononeuropathies: Secondary | ICD-10-CM | POA: Diagnosis not present

## 2020-06-15 DIAGNOSIS — S3282XA Multiple fractures of pelvis without disruption of pelvic ring, initial encounter for closed fracture: Secondary | ICD-10-CM | POA: Diagnosis not present

## 2020-06-15 DIAGNOSIS — M1611 Unilateral primary osteoarthritis, right hip: Secondary | ICD-10-CM | POA: Diagnosis not present

## 2020-06-15 DIAGNOSIS — S32810A Multiple fractures of pelvis with stable disruption of pelvic ring, initial encounter for closed fracture: Secondary | ICD-10-CM | POA: Diagnosis not present

## 2020-06-15 DIAGNOSIS — D72829 Elevated white blood cell count, unspecified: Secondary | ICD-10-CM | POA: Diagnosis present

## 2020-06-15 DIAGNOSIS — W19XXXA Unspecified fall, initial encounter: Secondary | ICD-10-CM

## 2020-06-15 DIAGNOSIS — R339 Retention of urine, unspecified: Secondary | ICD-10-CM | POA: Diagnosis not present

## 2020-06-15 DIAGNOSIS — S32511A Fracture of superior rim of right pubis, initial encounter for closed fracture: Secondary | ICD-10-CM | POA: Diagnosis not present

## 2020-06-15 DIAGNOSIS — R000 Tachycardia, unspecified: Secondary | ICD-10-CM | POA: Diagnosis not present

## 2020-06-15 DIAGNOSIS — S42291A Other displaced fracture of upper end of right humerus, initial encounter for closed fracture: Secondary | ICD-10-CM | POA: Diagnosis not present

## 2020-06-15 DIAGNOSIS — I10 Essential (primary) hypertension: Secondary | ICD-10-CM | POA: Diagnosis not present

## 2020-06-15 DIAGNOSIS — I7090 Unspecified atherosclerosis: Secondary | ICD-10-CM | POA: Diagnosis not present

## 2020-06-15 DIAGNOSIS — I129 Hypertensive chronic kidney disease with stage 1 through stage 4 chronic kidney disease, or unspecified chronic kidney disease: Secondary | ICD-10-CM | POA: Diagnosis not present

## 2020-06-15 DIAGNOSIS — Z743 Need for continuous supervision: Secondary | ICD-10-CM | POA: Diagnosis not present

## 2020-06-15 DIAGNOSIS — D62 Acute posthemorrhagic anemia: Secondary | ICD-10-CM | POA: Diagnosis not present

## 2020-06-15 DIAGNOSIS — S42201A Unspecified fracture of upper end of right humerus, initial encounter for closed fracture: Secondary | ICD-10-CM | POA: Diagnosis not present

## 2020-06-15 DIAGNOSIS — S42295A Other nondisplaced fracture of upper end of left humerus, initial encounter for closed fracture: Secondary | ICD-10-CM | POA: Diagnosis not present

## 2020-06-15 DIAGNOSIS — R6889 Other general symptoms and signs: Secondary | ICD-10-CM | POA: Diagnosis not present

## 2020-06-15 DIAGNOSIS — Z66 Do not resuscitate: Secondary | ICD-10-CM | POA: Diagnosis present

## 2020-06-15 DIAGNOSIS — S42351A Displaced comminuted fracture of shaft of humerus, right arm, initial encounter for closed fracture: Secondary | ICD-10-CM | POA: Diagnosis not present

## 2020-06-15 DIAGNOSIS — Z09 Encounter for follow-up examination after completed treatment for conditions other than malignant neoplasm: Secondary | ICD-10-CM

## 2020-06-15 LAB — BASIC METABOLIC PANEL
Anion gap: 13 (ref 5–15)
BUN: 28 mg/dL — ABNORMAL HIGH (ref 8–23)
CO2: 25 mmol/L (ref 22–32)
Calcium: 9.2 mg/dL (ref 8.9–10.3)
Chloride: 104 mmol/L (ref 98–111)
Creatinine, Ser: 1.23 mg/dL — ABNORMAL HIGH (ref 0.44–1.00)
GFR calc Af Amer: 45 mL/min — ABNORMAL LOW (ref >60)
GFR calc non Af Amer: 39 mL/min — ABNORMAL LOW (ref >60)
Glucose, Bld: 122 mg/dL — ABNORMAL HIGH (ref 70–99)
Potassium: 3.9 mmol/L (ref 3.5–5.1)
Sodium: 142 mmol/L (ref 135–145)

## 2020-06-15 LAB — CBC
HCT: 39.5 % (ref 36.0–46.0)
Hemoglobin: 13 g/dL (ref 12.0–15.0)
MCH: 29.5 pg (ref 26.0–34.0)
MCHC: 32.9 g/dL (ref 30.0–36.0)
MCV: 89.8 fL (ref 80.0–100.0)
Platelets: 237 10*3/uL (ref 150–400)
RBC: 4.4 MIL/uL (ref 3.87–5.11)
RDW: 13.6 % (ref 11.5–15.5)
WBC: 11.7 10*3/uL — ABNORMAL HIGH (ref 4.0–10.5)
nRBC: 0 % (ref 0.0–0.2)

## 2020-06-15 LAB — SARS CORONAVIRUS 2 BY RT PCR (HOSPITAL ORDER, PERFORMED IN ~~LOC~~ HOSPITAL LAB): SARS Coronavirus 2: NEGATIVE

## 2020-06-15 MED ORDER — ENOXAPARIN SODIUM 40 MG/0.4ML ~~LOC~~ SOLN
40.0000 mg | SUBCUTANEOUS | Status: DC
  Filled 2020-06-15: qty 0.4

## 2020-06-15 MED ORDER — SODIUM CHLORIDE 0.9% FLUSH
3.0000 mL | Freq: Two times a day (BID) | INTRAVENOUS | Status: DC
  Administered 2020-06-15 – 2020-06-20 (×8): 3 mL via INTRAVENOUS

## 2020-06-15 MED ORDER — CHLORTHALIDONE 25 MG PO TABS
25.0000 mg | ORAL_TABLET | Freq: Every morning | ORAL | Status: DC
  Administered 2020-06-16: 25 mg via ORAL
  Filled 2020-06-15 (×4): qty 1

## 2020-06-15 MED ORDER — FLUCONAZOLE 150 MG PO TABS
150.0000 mg | ORAL_TABLET | ORAL | Status: DC
  Administered 2020-06-15: 150 mg via ORAL
  Filled 2020-06-15: qty 1

## 2020-06-15 MED ORDER — METHOCARBAMOL 1000 MG/10ML IJ SOLN
500.0000 mg | Freq: Four times a day (QID) | INTRAVENOUS | Status: DC | PRN
  Filled 2020-06-15: qty 5

## 2020-06-15 MED ORDER — ACETAMINOPHEN 500 MG PO TABS
1000.0000 mg | ORAL_TABLET | Freq: Three times a day (TID) | ORAL | Status: DC
  Administered 2020-06-15 – 2020-06-19 (×11): 1000 mg via ORAL
  Filled 2020-06-15 (×11): qty 2

## 2020-06-15 MED ORDER — SODIUM CHLORIDE 0.9% FLUSH: 3.0000 mL | INTRAVENOUS | Status: DC | PRN

## 2020-06-15 MED ORDER — FENTANYL CITRATE (PF) 100 MCG/2ML IJ SOLN
50.0000 ug | Freq: Once | INTRAMUSCULAR | Status: AC
  Administered 2020-06-15: 50 ug via INTRAVENOUS
  Filled 2020-06-15: qty 2

## 2020-06-15 MED ORDER — NYSTATIN-TRIAMCINOLONE 100000-0.1 UNIT/GM-% EX OINT
1.0000 "application " | TOPICAL_OINTMENT | Freq: Two times a day (BID) | CUTANEOUS | Status: DC
  Filled 2020-06-15: qty 15

## 2020-06-15 MED ORDER — HEPARIN SODIUM (PORCINE) 5000 UNIT/ML IJ SOLN
5000.0000 [IU] | Freq: Three times a day (TID) | INTRAMUSCULAR | Status: DC
  Administered 2020-06-15 – 2020-06-20 (×14): 5000 [IU] via SUBCUTANEOUS
  Filled 2020-06-15 (×16): qty 1

## 2020-06-15 MED ORDER — HYDROMORPHONE HCL 1 MG/ML IJ SOLN
0.5000 mg | Freq: Once | INTRAMUSCULAR | Status: AC
  Administered 2020-06-15: 0.5 mg via INTRAVENOUS
  Filled 2020-06-15: qty 1

## 2020-06-15 MED ORDER — KETOROLAC TROMETHAMINE 15 MG/ML IJ SOLN
15.0000 mg | Freq: Four times a day (QID) | INTRAMUSCULAR | Status: DC | PRN
  Administered 2020-06-16 – 2020-06-18 (×2): 15 mg via INTRAVENOUS
  Filled 2020-06-15 (×2): qty 1

## 2020-06-15 MED ORDER — SENNA 8.6 MG PO TABS
1.0000 | ORAL_TABLET | Freq: Two times a day (BID) | ORAL | Status: DC
  Administered 2020-06-15 – 2020-06-20 (×7): 8.6 mg via ORAL
  Filled 2020-06-15 (×15): qty 1

## 2020-06-15 MED ORDER — SODIUM CHLORIDE 0.9 % IV SOLN: 250.0000 mL | INTRAVENOUS | Status: DC | PRN
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
## 2020-06-15 NOTE — Progress Notes (Signed)
Received call from [facility] emergency room.  Patient with pubic ramus fracture and proximal humerus fracture, being transferred to [facility].  Plan for nonsurgical management of the pubic ramus fracture, she may elect for reverse shoulder replacement for the shoulder, full consult to follow likely Friday, September 10, once she arrives to [facility].  Ok to eat for now.    Teryl Lucy, MD
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
## 2020-06-16 DIAGNOSIS — S329XXA Fracture of unspecified parts of lumbosacral spine and pelvis, initial encounter for closed fracture: Secondary | ICD-10-CM | POA: Diagnosis present

## 2020-06-16 MED ORDER — NYSTATIN-TRIAMCINOLONE 100000-0.1 UNIT/GM-% EX CREA
TOPICAL_CREAM | Freq: Two times a day (BID) | CUTANEOUS | Status: DC
  Filled 2020-06-16: qty 15

## 2020-06-16 NOTE — ED Notes (Signed)
Report to Tammy, RN

## 2020-06-16 NOTE — ED Notes (Signed)
Awaiting room assignment from bed control and transfer

## 2020-06-16 NOTE — ED Notes (Signed)
Report to [facility]

## 2020-06-16 NOTE — Significant Event (Addendum)
HOSPITAL MEDICINE OVERNIGHT EVENT NOTE    Called by EMS via [facility] en route from [facility] to [facility] and notified that patient is suddenly in atrial fibrillation, heart rate ranging between 80-140BPM. Patient has exhibited no change in mentation, no chest pain and no shortness of breath.  BP is 173/78.  Since patient is not unstable and resting comfortably en route I advised no meds given.    When patient arrives at [facility] then determination can be made on medical therapy. Patient's bed request changed to progressive bed.  Marinda Elk  MD [facility]   ADDENDUM   I have evaluated the patient's ECG performed by EMS in additional to reviewing several telemetry strips upon arrival to 6E room 11 progressive unit.  ECG is likely sinus tachycardia with frequent PAC's.  Tele strips are more convincing in establishing that patient is in sinus tachycardia.    Will continue to monitor on this unit overnight.  Day provider can confirm telemetry findings in the morning and downgrade from progressive when deemed appropriate.  Deno Lunger Jax Abdelrahman

## 2020-06-16 NOTE — ED Notes (Signed)
Call for report    No response

## 2020-06-16 NOTE — ED Notes (Signed)
Pt still has not been able to urinate at this time Anastasio Wogan

## 2020-06-16 NOTE — ED Notes (Addendum)
Pt daughter asks to speak with someone regarding the bed control   Tim, RN, [facility] in to speak with pt and family
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
## 2020-06-16 NOTE — ED Notes (Signed)
Tim, RN, [facility] has brought a hospital bed  Pt moved gently into bed and reports much relief

## 2020-06-16 NOTE — ED Notes (Signed)
Bed preassignment removed by bed control   Pt and daughter informed   Pt continues to await bed assignment and transfer

## 2020-06-17 LAB — COMPREHENSIVE METABOLIC PANEL
ALT: 19 U/L (ref 0–44)
AST: 25 U/L (ref 15–41)
Albumin: 3.2 g/dL — ABNORMAL LOW (ref 3.5–5.0)
Alkaline Phosphatase: 67 U/L (ref 38–126)
Anion gap: 13 (ref 5–15)
BUN: 30 mg/dL — ABNORMAL HIGH (ref 8–23)
CO2: 22 mmol/L (ref 22–32)
Calcium: 8.3 mg/dL — ABNORMAL LOW (ref 8.9–10.3)
Chloride: 104 mmol/L (ref 98–111)
Creatinine, Ser: 1.4 mg/dL — ABNORMAL HIGH (ref 0.44–1.00)
GFR calc Af Amer: 38 mL/min — ABNORMAL LOW (ref >60)
GFR calc non Af Amer: 33 mL/min — ABNORMAL LOW (ref >60)
Glucose, Bld: 120 mg/dL — ABNORMAL HIGH (ref 70–99)
Potassium: 3.4 mmol/L — ABNORMAL LOW (ref 3.5–5.1)
Sodium: 139 mmol/L (ref 135–145)
Total Bilirubin: 1 mg/dL (ref 0.3–1.2)
Total Protein: 5.8 g/dL — ABNORMAL LOW (ref 6.5–8.1)

## 2020-06-17 LAB — CBC
HCT: 31.9 % — ABNORMAL LOW (ref 36.0–46.0)
Hemoglobin: 10.6 g/dL — ABNORMAL LOW (ref 12.0–15.0)
MCH: 28.9 pg (ref 26.0–34.0)
MCHC: 33.2 g/dL (ref 30.0–36.0)
MCV: 86.9 fL (ref 80.0–100.0)
Platelets: 216 10*3/uL (ref 150–400)
RBC: 3.67 MIL/uL — ABNORMAL LOW (ref 3.87–5.11)
RDW: 13.8 % (ref 11.5–15.5)
WBC: 7.9 10*3/uL (ref 4.0–10.5)
nRBC: 0 % (ref 0.0–0.2)

## 2020-06-17 LAB — MAGNESIUM: Magnesium: 1.8 mg/dL (ref 1.7–2.4)

## 2020-06-17 MED ORDER — SODIUM CHLORIDE 0.9 % IV SOLN: INTRAVENOUS | Status: AC

## 2020-06-17 MED ORDER — POTASSIUM CHLORIDE CRYS ER 20 MEQ PO TBCR
20.0000 meq | EXTENDED_RELEASE_TABLET | Freq: Once | ORAL | Status: AC
  Administered 2020-06-17: 20 meq via ORAL
  Filled 2020-06-17: qty 1

## 2020-06-17 NOTE — Progress Notes (Signed)
Pt son requesting to speak with MD . Dr. Dayna Barker paged and was able to speak with son. Will continue to monitor and refer as appropriate.
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
## 2020-06-17 NOTE — Progress Notes (Signed)
[facility]opa[facility]  Ortho aware of pts arrival to [facility]  R pelvic ring fracture is an insufficiency type fracture and is non-op. She Can WBAT  Complex R  Proximal humerus fracture, surgical pattern in nature and would require a reverse total shoulder arthroplasty. Will discuss with pt her wishes.  Could possibly treat non op but she may end up with a poor functional result   Numerous acute operative cases today.  Will eval pt when we can. No plans for OR today or tomorrow. Ok to eat   NWB R UEx WBAT R LEx  Sling R UEx   Ice PRN R shoulder for pain/swelling  Ok to work with therapies  Mearl Latin, PA-C [id number] (C) 06/17/2020, 11:50 AM  [facility] [address] [place] [address] [phone] Collier Bullock (F)

## 2020-06-17 NOTE — Progress Notes (Signed)
Discussed case with patients son. Family talked with Dr. Dion Saucier as well as myself and plan to proceed with surgery on Monday. Will work on scheduling. Formal consult to follow. Npo for Monday.
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
## 2020-06-18 LAB — CBC
HCT: 30.3 % — ABNORMAL LOW (ref 36.0–46.0)
Hemoglobin: 10.3 g/dL — ABNORMAL LOW (ref 12.0–15.0)
MCH: 29.9 pg (ref 26.0–34.0)
MCHC: 34 g/dL (ref 30.0–36.0)
MCV: 88.1 fL (ref 80.0–100.0)
Platelets: 212 10*3/uL (ref 150–400)
RBC: 3.44 MIL/uL — ABNORMAL LOW (ref 3.87–5.11)
RDW: 14 % (ref 11.5–15.5)
WBC: 7.6 10*3/uL (ref 4.0–10.5)
nRBC: 0 % (ref 0.0–0.2)

## 2020-06-18 LAB — BASIC METABOLIC PANEL
Anion gap: 11 (ref 5–15)
BUN: 30 mg/dL — ABNORMAL HIGH (ref 8–23)
CO2: 21 mmol/L — ABNORMAL LOW (ref 22–32)
Calcium: 8.2 mg/dL — ABNORMAL LOW (ref 8.9–10.3)
Chloride: 107 mmol/L (ref 98–111)
Creatinine, Ser: 1.19 mg/dL — ABNORMAL HIGH (ref 0.44–1.00)
GFR calc Af Amer: 47 mL/min — ABNORMAL LOW (ref >60)
GFR calc non Af Amer: 40 mL/min — ABNORMAL LOW (ref >60)
Glucose, Bld: 119 mg/dL — ABNORMAL HIGH (ref 70–99)
Potassium: 3.5 mmol/L (ref 3.5–5.1)
Sodium: 139 mmol/L (ref 135–145)

## 2020-06-18 LAB — VITAMIN D 25 HYDROXY (VIT D DEFICIENCY, FRACTURES): Vit D, 25-Hydroxy: 46.33 ng/mL (ref 30–100)
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
## 2020-06-19 ENCOUNTER — Inpatient Hospital Stay (HOSPITAL_COMMUNITY): Payer: Medicare Other

## 2020-06-19 ENCOUNTER — Inpatient Hospital Stay (HOSPITAL_COMMUNITY): Payer: Medicare Other | Admitting: Certified Registered"

## 2020-06-19 ENCOUNTER — Encounter (HOSPITAL_COMMUNITY)
Admission: EM | Disposition: A | Discharge status: Home Health | Payer: Self-pay | Source: Home / Self Care | Attending: Internal Medicine

## 2020-06-19 HISTORY — PX: REVERSE SHOULDER ARTHROPLASTY: SHX5054

## 2020-06-19 LAB — BASIC METABOLIC PANEL
Anion gap: 10 (ref 5–15)
BUN: 39 mg/dL — ABNORMAL HIGH (ref 8–23)
CO2: 23 mmol/L (ref 22–32)
Calcium: 8.2 mg/dL — ABNORMAL LOW (ref 8.9–10.3)
Chloride: 106 mmol/L (ref 98–111)
Creatinine, Ser: 1.27 mg/dL — ABNORMAL HIGH (ref 0.44–1.00)
GFR calc Af Amer: 43 mL/min — ABNORMAL LOW (ref >60)
GFR calc non Af Amer: 37 mL/min — ABNORMAL LOW (ref >60)
Glucose, Bld: 110 mg/dL — ABNORMAL HIGH (ref 70–99)
Potassium: 3.3 mmol/L — ABNORMAL LOW (ref 3.5–5.1)
Sodium: 139 mmol/L (ref 135–145)

## 2020-06-19 LAB — SURGICAL PCR SCREEN
MRSA, PCR: NEGATIVE
Staphylococcus aureus: NEGATIVE

## 2020-06-19 SURGERY — ARTHROPLASTY, SHOULDER, TOTAL, REVERSE
Anesthesia: Regional | Site: Shoulder | Laterality: Right

## 2020-06-19 MED ORDER — OXYCODONE HCL 5 MG PO TABS: 5.0000 mg | ORAL_TABLET | Freq: Once | ORAL | Status: DC | PRN

## 2020-06-19 MED ORDER — OXYCODONE HCL 5 MG PO TABS: 10.0000 mg | ORAL_TABLET | ORAL | Status: DC | PRN

## 2020-06-19 MED ORDER — PHENYLEPHRINE HCL (PRESSORS) 10 MG/ML IV SOLN
INTRAVENOUS | Status: AC
  Filled 2020-06-19: qty 1

## 2020-06-19 MED ORDER — ACETAMINOPHEN 10 MG/ML IV SOLN: 1000.0000 mg | Freq: Once | INTRAVENOUS | Status: DC | PRN

## 2020-06-19 MED ORDER — DEXAMETHASONE SODIUM PHOSPHATE 10 MG/ML IJ SOLN
INTRAMUSCULAR | Status: DC | PRN
  Administered 2020-06-19: 10 mg via INTRAVENOUS

## 2020-06-19 MED ORDER — KCL IN DEXTROSE-NACL 20-5-0.45 MEQ/L-%-% IV SOLN
INTRAVENOUS | Status: DC
  Filled 2020-06-19 (×2): qty 1000

## 2020-06-19 MED ORDER — FENTANYL CITRATE (PF) 250 MCG/5ML IJ SOLN
INTRAMUSCULAR | Status: AC
  Filled 2020-06-19: qty 5

## 2020-06-19 MED ORDER — TRANEXAMIC ACID-NACL 1000-0.7 MG/100ML-% IV SOLN
INTRAVENOUS | Status: AC
  Filled 2020-06-19: qty 100

## 2020-06-19 MED ORDER — ONDANSETRON HCL 4 MG/2ML IJ SOLN
INTRAMUSCULAR | Status: DC | PRN
  Administered 2020-06-19: 4 mg via INTRAVENOUS

## 2020-06-19 MED ORDER — MENTHOL 3 MG MT LOZG: 1.0000 | LOZENGE | OROMUCOSAL | Status: DC | PRN

## 2020-06-19 MED ORDER — METHOCARBAMOL 1000 MG/10ML IJ SOLN
500.0000 mg | Freq: Four times a day (QID) | INTRAVENOUS | Status: DC | PRN
  Filled 2020-06-19: qty 5

## 2020-06-19 MED ORDER — OXYCODONE HCL 5 MG/5ML PO SOLN: 5.0000 mg | Freq: Once | ORAL | Status: DC | PRN

## 2020-06-19 MED ORDER — ONDANSETRON HCL 4 MG/2ML IJ SOLN
INTRAMUSCULAR | Status: AC
  Filled 2020-06-19: qty 2

## 2020-06-19 MED ORDER — FENTANYL CITRATE (PF) 100 MCG/2ML IJ SOLN
INTRAMUSCULAR | Status: AC
  Administered 2020-06-19: 25 ug via INTRAVENOUS
  Filled 2020-06-19: qty 2

## 2020-06-19 MED ORDER — CEFAZOLIN SODIUM-DEXTROSE 2-4 GM/100ML-% IV SOLN
2.0000 g | INTRAVENOUS | Status: AC
  Administered 2020-06-19: 2 g via INTRAVENOUS
  Filled 2020-06-19: qty 100

## 2020-06-19 MED ORDER — FENTANYL CITRATE (PF) 100 MCG/2ML IJ SOLN: 25.0000 ug | Freq: Once | INTRAMUSCULAR | Status: AC

## 2020-06-19 MED ORDER — LACTATED RINGERS IV SOLN: INTRAVENOUS | Status: DC

## 2020-06-19 MED ORDER — OXYCODONE HCL 5 MG PO TABS
5.0000 mg | ORAL_TABLET | ORAL | Status: DC | PRN
  Administered 2020-06-20 – 2020-06-21 (×3): 5 mg via ORAL
  Filled 2020-06-19 (×3): qty 1

## 2020-06-19 MED ORDER — ACETAMINOPHEN 500 MG PO TABS
1000.0000 mg | ORAL_TABLET | Freq: Three times a day (TID) | ORAL | Status: AC
  Administered 2020-06-19 – 2020-06-20 (×4): 1000 mg via ORAL
  Filled 2020-06-19 (×4): qty 2

## 2020-06-19 MED ORDER — LIDOCAINE 2% (20 MG/ML) 5 ML SYRINGE
INTRAMUSCULAR | Status: DC | PRN
  Administered 2020-06-19: 40 mg via INTRAVENOUS

## 2020-06-19 MED ORDER — FENTANYL CITRATE (PF) 100 MCG/2ML IJ SOLN
25.0000 ug | INTRAMUSCULAR | Status: DC | PRN
  Administered 2020-06-19: 25 ug via INTRAVENOUS

## 2020-06-19 MED ORDER — CHLORHEXIDINE GLUCONATE 0.12 % MT SOLN
OROMUCOSAL | Status: AC
  Administered 2020-06-19: 15 mL via OROMUCOSAL
  Filled 2020-06-19: qty 15

## 2020-06-19 MED ORDER — ROCURONIUM BROMIDE 10 MG/ML (PF) SYRINGE
PREFILLED_SYRINGE | INTRAVENOUS | Status: AC
  Filled 2020-06-19: qty 10

## 2020-06-19 MED ORDER — DEXAMETHASONE SODIUM PHOSPHATE 10 MG/ML IJ SOLN
INTRAMUSCULAR | Status: AC
  Filled 2020-06-19: qty 1

## 2020-06-19 MED ORDER — LIDOCAINE 2% (20 MG/ML) 5 ML SYRINGE
INTRAMUSCULAR | Status: AC
  Filled 2020-06-19: qty 5

## 2020-06-19 MED ORDER — ACETAMINOPHEN 500 MG PO TABS: 1000.0000 mg | ORAL_TABLET | Freq: Once | ORAL | Status: DC | PRN

## 2020-06-19 MED ORDER — TRANEXAMIC ACID-NACL 1000-0.7 MG/100ML-% IV SOLN
1000.0000 mg | INTRAVENOUS | Status: AC
  Administered 2020-06-19: 1000 mg via INTRAVENOUS
  Filled 2020-06-19: qty 100

## 2020-06-19 MED ORDER — VANCOMYCIN HCL 1000 MG IV SOLR
INTRAVENOUS | Status: AC
  Filled 2020-06-19: qty 1000

## 2020-06-19 MED ORDER — FENTANYL CITRATE (PF) 250 MCG/5ML IJ SOLN
INTRAMUSCULAR | Status: DC | PRN
Start: 2020-06-19 — End: 2020-06-19
  Administered 2020-06-19: 100 ug via INTRAVENOUS

## 2020-06-19 MED ORDER — MAGNESIUM CITRATE PO SOLN: 1.0000 | Freq: Once | ORAL | Status: DC | PRN

## 2020-06-19 MED ORDER — DIPHENHYDRAMINE HCL 12.5 MG/5ML PO ELIX: 12.5000 mg | ORAL_SOLUTION | ORAL | Status: DC | PRN

## 2020-06-19 MED ORDER — HYDROMORPHONE HCL 1 MG/ML IJ SOLN: 0.2500 mg | INTRAMUSCULAR | Status: DC | PRN

## 2020-06-19 MED ORDER — FENTANYL CITRATE (PF) 100 MCG/2ML IJ SOLN
INTRAMUSCULAR | Status: AC
Start: 2020-06-19 — End: 2020-06-20
  Filled 2020-06-19: qty 2

## 2020-06-19 MED ORDER — POTASSIUM CHLORIDE 2 MEQ/ML IV SOLN: INTRAVENOUS | Status: DC

## 2020-06-19 MED ORDER — VANCOMYCIN HCL 1000 MG IV SOLR
INTRAVENOUS | Status: DC | PRN
  Administered 2020-06-19: 1000 mg via TOPICAL

## 2020-06-19 MED ORDER — PHENOL 1.4 % MT LIQD: 1.0000 | OROMUCOSAL | Status: DC | PRN

## 2020-06-19 MED ORDER — PROPOFOL 10 MG/ML IV BOLUS
INTRAVENOUS | Status: DC | PRN
  Administered 2020-06-19: 40 mg via INTRAVENOUS
  Administered 2020-06-19: 30 mg via INTRAVENOUS
  Administered 2020-06-19: 70 mg via INTRAVENOUS

## 2020-06-19 MED ORDER — POLYETHYLENE GLYCOL 3350 17 G PO PACK: 17.0000 g | PACK | Freq: Every day | ORAL | Status: DC | PRN

## 2020-06-19 MED ORDER — SODIUM CHLORIDE 0.9 % IR SOLN
Status: DC | PRN
  Administered 2020-06-19: 3000 mL

## 2020-06-19 MED ORDER — PROPOFOL 500 MG/50ML IV EMUL
INTRAVENOUS | Status: DC | PRN
  Administered 2020-06-19: 150 ug/kg/min via INTRAVENOUS

## 2020-06-19 MED ORDER — 0.9 % SODIUM CHLORIDE (POUR BTL) OPTIME
TOPICAL | Status: DC | PRN
  Administered 2020-06-19: 1000 mL

## 2020-06-19 MED ORDER — METHOCARBAMOL 500 MG PO TABS: 500.0000 mg | ORAL_TABLET | Freq: Four times a day (QID) | ORAL | Status: DC | PRN

## 2020-06-19 MED ORDER — CHLORHEXIDINE GLUCONATE 0.12 % MT SOLN
15.0000 mL | Freq: Once | OROMUCOSAL | Status: AC
  Filled 2020-06-19: qty 15

## 2020-06-19 MED ORDER — PHENYLEPHRINE HCL-NACL 10-0.9 MG/250ML-% IV SOLN: INTRAVENOUS | Status: DC | PRN

## 2020-06-19 MED ORDER — CEFAZOLIN SODIUM-DEXTROSE 1-4 GM/50ML-% IV SOLN
1.0000 g | Freq: Four times a day (QID) | INTRAVENOUS | Status: AC
  Administered 2020-06-20 (×2): 1 g via INTRAVENOUS
  Filled 2020-06-19 (×3): qty 50

## 2020-06-19 MED ORDER — BISACODYL 10 MG RE SUPP: 10.0000 mg | Freq: Every day | RECTAL | Status: DC | PRN

## 2020-06-19 MED ORDER — ROCURONIUM BROMIDE 10 MG/ML (PF) SYRINGE
PREFILLED_SYRINGE | INTRAVENOUS | Status: DC | PRN
  Administered 2020-06-19: 20 mg via INTRAVENOUS
  Administered 2020-06-19: 50 mg via INTRAVENOUS

## 2020-06-19 MED ORDER — ONDANSETRON HCL 4 MG PO TABS: 4.0000 mg | ORAL_TABLET | Freq: Four times a day (QID) | ORAL | Status: DC | PRN

## 2020-06-19 MED ORDER — ONDANSETRON HCL 4 MG/2ML IJ SOLN: 4.0000 mg | Freq: Four times a day (QID) | INTRAMUSCULAR | Status: DC | PRN

## 2020-06-19 MED ORDER — SUGAMMADEX SODIUM 200 MG/2ML IV SOLN
INTRAVENOUS | Status: DC | PRN
  Administered 2020-06-19: 200 mg via INTRAVENOUS

## 2020-06-19 MED ORDER — PHENYLEPHRINE HCL-NACL 10-0.9 MG/250ML-% IV SOLN
INTRAVENOUS | Status: DC | PRN
  Administered 2020-06-19: 30 ug/min via INTRAVENOUS

## 2020-06-19 MED ORDER — ACETAMINOPHEN 160 MG/5ML PO SOLN: 1000.0000 mg | Freq: Once | ORAL | Status: DC | PRN

## 2020-06-19 SURGICAL SUPPLY — 79 items
BASEPLATE GLENOSPHERE 25 STD (Miscellaneous) ×1 IMPLANT
BIT DRILL 3.2 PERIPHERAL SCREW (BIT) ×1 IMPLANT
BLADE SAW SAG 73X25 THK (BLADE) ×1
BLADE SAW SGTL 73X25 THK (BLADE) ×1 IMPLANT
CAP LOCKING COCR (Cap) ×1 IMPLANT
CHLORAPREP W/TINT 26 (MISCELLANEOUS) ×4 IMPLANT
CLSR STERI-STRIP ANTIMIC 1/2X4 (GAUZE/BANDAGES/DRESSINGS) ×1 IMPLANT
COVER SURGICAL LIGHT HANDLE (MISCELLANEOUS) ×2 IMPLANT
COVER WAND RF STERILE (DRAPES) ×2 IMPLANT
DRAPE C-ARM 42X72 X-RAY (DRAPES) IMPLANT
DRAPE HALF SHEET 40X57 (DRAPES) ×2 IMPLANT
DRAPE INCISE IOBAN 66X45 STRL (DRAPES) ×4 IMPLANT
DRAPE ORTHO SPLIT 77X108 STRL (DRAPES) ×4
DRAPE SURG ORHT 6 SPLT 77X108 (DRAPES) ×2 IMPLANT
DRAPE SWITCH (DRAPES) ×2 IMPLANT
DRAPE U-SHAPE 47X51 STRL (DRAPES) IMPLANT
DRSG AQUACEL AG ADV 3.5X 6 (GAUZE/BANDAGES/DRESSINGS) ×1 IMPLANT
DRSG AQUACEL AG ADV 3.5X10 (GAUZE/BANDAGES/DRESSINGS) ×1 IMPLANT
ELECT REM PT RETURN 9FT ADLT (ELECTROSURGICAL) ×2
ELECTRODE REM PT RTRN 9FT ADLT (ELECTROSURGICAL) ×1 IMPLANT
GLENOSPHERE STANDARD 39 (Joint) ×2 IMPLANT
GLENOSPHERE STD 39 (Joint) IMPLANT
GLOVE BIO SURGEON STRL SZ 6.5 (GLOVE) ×2 IMPLANT
GLOVE BIOGEL PI IND STRL 8 (GLOVE) ×1 IMPLANT
GLOVE BIOGEL PI INDICATOR 8 (GLOVE) ×1
GLOVE ECLIPSE 8.0 STRL XLNG CF (GLOVE) ×4 IMPLANT
GLOVE INDICATOR 6.5 STRL GRN (GLOVE) ×2 IMPLANT
GOWN STRL REUS W/ TWL LRG LVL3 (GOWN DISPOSABLE) ×1 IMPLANT
GOWN STRL REUS W/ TWL XL LVL3 (GOWN DISPOSABLE) IMPLANT
GOWN STRL REUS W/TWL 2XL LVL3 (GOWN DISPOSABLE) IMPLANT
GOWN STRL REUS W/TWL LRG LVL3 (GOWN DISPOSABLE) ×2
GOWN STRL REUS W/TWL XL LVL3 (GOWN DISPOSABLE)
GUIDEWIRE GLENOID 2.5X220 (WIRE) ×1 IMPLANT
HANDPIECE INTERPULSE COAX TIP (DISPOSABLE) ×2
IMPL PROX BODY PTC 15X132 (Stem) IMPLANT
IMPL REVERSE SHOULDER 0X3.5 (Shoulder) IMPLANT
IMPLANT PROX BODY PTC 15X132 (Stem) ×2 IMPLANT
IMPLANT REVERSE SHOULDER 0X3.5 (Shoulder) ×2 IMPLANT
INSERT REV KIT SHOULDER 6X39 (Screw) ×1 IMPLANT
KIT BASIN OR (CUSTOM PROCEDURE TRAY) ×2 IMPLANT
KIT STABILIZATION SHOULDER (MISCELLANEOUS) ×2 IMPLANT
KIT TURNOVER KIT B (KITS) ×2 IMPLANT
MANIFOLD NEPTUNE II (INSTRUMENTS) ×2 IMPLANT
NDL 1/2 CIR MAYO (NEEDLE) IMPLANT
NDL HYPO 25GX1X1/2 BEV (NEEDLE) IMPLANT
NDL MAYO TROCAR (NEEDLE) IMPLANT
NEEDLE 1/2 CIR MAYO (NEEDLE) ×2 IMPLANT
NEEDLE HYPO 25GX1X1/2 BEV (NEEDLE) IMPLANT
NEEDLE MAYO TROCAR (NEEDLE) ×2 IMPLANT
NS IRRIG 1000ML POUR BTL (IV SOLUTION) ×2 IMPLANT
PACK SHOULDER (CUSTOM PROCEDURE TRAY) ×2 IMPLANT
PAD ARMBOARD 7.5X6 YLW CONV (MISCELLANEOUS) ×4 IMPLANT
RESTRAINT HEAD UNIVERSAL NS (MISCELLANEOUS) ×2 IMPLANT
SCREW 5.5X26 (Screw) ×1 IMPLANT
SCREW ASSEMBLY COCR TYPE 0 (Screw) ×1 IMPLANT
SCREW BONE THREAD 6.5X35 (Screw) ×1 IMPLANT
SCREW PERIPHERAL 42 (Screw) ×1 IMPLANT
SET HNDPC FAN SPRY TIP SCT (DISPOSABLE) ×1 IMPLANT
SLING ARM IMMOBILIZER LRG (SOFTGOODS) ×1 IMPLANT
SPONGE LAP 18X18 RF (DISPOSABLE) ×2 IMPLANT
STEM PTC DISTAL 15X90 (Stem) ×1 IMPLANT
STRIP CLOSURE SKIN 1/2X4 (GAUZE/BANDAGES/DRESSINGS) ×2 IMPLANT
SUCTION FRAZIER HANDLE 10FR (MISCELLANEOUS)
SUCTION TUBE FRAZIER 10FR DISP (MISCELLANEOUS) IMPLANT
SUT ETHIBOND 2 V 37 (SUTURE) ×2 IMPLANT
SUT ETHIBOND NAB CT1 #1 30IN (SUTURE) ×1 IMPLANT
SUT FIBERWIRE #5 38 CONV NDL (SUTURE) ×12
SUT MNCRL AB 3-0 PS2 18 (SUTURE) ×1 IMPLANT
SUT MNCRL AB 4-0 PS2 18 (SUTURE) ×1 IMPLANT
SUT VIC AB 0 CT1 27 (SUTURE) ×2
SUT VIC AB 0 CT1 27XBRD ANBCTR (SUTURE) IMPLANT
SUT VIC AB 2-0 CT1 27 (SUTURE) ×2
SUT VIC AB 2-0 CT1 TAPERPNT 27 (SUTURE) ×1 IMPLANT
SUT VIC AB 3-0 SH 27 (SUTURE) ×2
SUT VIC AB 3-0 SH 27X BRD (SUTURE) IMPLANT
SUTURE FIBERWR #5 38 CONV NDL (SUTURE) ×4 IMPLANT
TOWEL GREEN STERILE (TOWEL DISPOSABLE) ×2 IMPLANT
TRAY FOLEY W/BAG SLVR 14FR (SET/KITS/TRAYS/PACK) IMPLANT
WATER STERILE IRR 1000ML POUR (IV SOLUTION) ×2 IMPLANT

## 2020-06-19 NOTE — Transfer of Care (Signed)
Immediate Anesthesia Transfer of Care Note  Patient: Sheila Jordan  Procedure(s) Performed: REVERSE TOTAL SHOULDER ARTHROPLASTY (Right Shoulder)  Patient Location: PACU  Anesthesia Type:General  Level of Consciousness: awake, alert  and oriented  Airway & Oxygen Therapy: Patient Spontanous Breathing  Post-op Assessment: Report given to RN and Post -op Vital signs reviewed and stable  Post vital signs: Reviewed and stable  Last Vitals:  Vitals Value Taken Time  BP    Temp    Pulse    Resp    SpO2      Last Pain:  Vitals:   06/19/20 1626  TempSrc:   PainSc: (P) 0-No pain      Patients Stated Pain Goal: 2 (06/19/20 0900)  Complications: No complications documented.
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
## 2020-06-19 NOTE — Anesthesia Preprocedure Evaluation (Signed)
Anesthesia Evaluation  Patient identified by MRN, date of birth, ID band Patient awake    Reviewed: Allergy & Precautions, NPO status , Patient's Chart, lab work & pertinent test results  History of Anesthesia Complications Negative for: history of anesthetic complications  Airway Mallampati: III  TM Distance: >3 FB Neck ROM: Full    Dental  (+) Dental Advisory Given, Teeth Intact   Pulmonary neg pulmonary ROS,  Covid-19 Nucleic Acid Test Results Lab Results      Component                Value               Date                      SARSCOV2NAA              NEGATIVE            06/15/2020              breath sounds clear to auscultation       Cardiovascular hypertension, Pt. on medications (-) angina(-) Past MI and (-) CHF (-) dysrhythmias  Rhythm:Regular     Neuro/Psych negative neurological ROS  negative psych ROS   GI/Hepatic negative GI ROS, Neg liver ROS,   Endo/Other  negative endocrine ROS  Renal/GU ARFRenal diseaseLab Results      Component                Value               Date                      CREATININE               1.27 (H)            06/19/2020           Lab Results      Component                Value               Date                      K                        3.3 (L)             06/19/2020                Musculoskeletal  (+) Arthritis , Right proximal humerus fracture   Abdominal   Peds  Hematology  (+) Blood dyscrasia, anemia , Lab Results      Component                Value               Date                      WBC                      7.6                 06/18/2020                HGB  10.3 (L)            06/18/2020                HCT                      30.3 (L)            06/18/2020                MCV                      88.1                06/18/2020                PLT                      212                 06/18/2020              Anesthesia Other  Findings Dr Everardo Pacific request nerve block for procedure. Preop axillary nerve palsy (right) noted by surgeon  Reproductive/Obstetrics                             Anesthesia Physical Anesthesia Plan  ASA: II  Anesthesia Plan: General and Regional   Post-op Pain Management:    Induction: Intravenous  PONV Risk Score and Plan: 3 and Ondansetron and Dexamethasone  Airway Management Planned: Oral ETT  Additional Equipment: None  Intra-op Plan:   Post-operative Plan: Extubation in OR  Informed Consent: I have reviewed the patients History and Physical, chart, labs and discussed the procedure including the risks, benefits and alternatives for the proposed anesthesia with the patient or authorized representative who has indicated his/her understanding and acceptance.     Dental advisory given  Plan Discussed with: CRNA and Surgeon  Anesthesia Plan Comments:         Anesthesia Quick Evaluation
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
## 2020-06-19 NOTE — Interval H&P Note (Signed)
History and Physical Interval Note:  06/19/2020 1:49 PM  Sheila Jordan  has presented today for surgery, with the diagnosis of Right proximal humerus fracture.  The various methods of treatment have been discussed with the patient and family. After consideration of risks, benefits and other options for treatment, the patient has consented to  Procedure(s): REVERSE SHOULDER ARTHROPLASTY (Right) as a surgical intervention.  The patient's history has been reviewed, patient examined, no change in status, stable for surgery.  I have reviewed the patient's chart and labs.  Questions were answered to the patient's satisfaction.     Bjorn Pippin
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
## 2020-06-19 NOTE — Progress Notes (Signed)
Orthopedic Tech Progress Note Patient Details:  Sheila Jordan 06/26/1928 [id number] OR RN called requesting a SHOULDER ABDUCTION PILLOW. Dropped off at OR DESK Ortho Devices Type of Ortho Device: Shoulder abduction pillow Ortho Device/Splint Interventions: Other (comment)   Post Interventions Patient Tolerated: Other (comment) Instructions Provided: Other (comment)   [name] 06/19/2020, 3:07 PM

## 2020-06-19 NOTE — Anesthesia Procedure Notes (Signed)
Procedure Name: Intubation Date/Time: 06/19/2020 2:25 PM Performed by: Griffin Dakin, CRNA Pre-anesthesia Checklist: Patient identified, Emergency Drugs available, Suction available and Patient being monitored Patient Re-evaluated:Patient Re-evaluated prior to induction Oxygen Delivery Method: Circle system utilized Preoxygenation: Pre-oxygenation with 100% oxygen Induction Type: IV induction Ventilation: Mask ventilation without difficulty Laryngoscope Size: Mac and 3 Grade View: Grade I Tube type: Oral Tube size: 7.0 mm Number of attempts: 1 Airway Equipment and Method: Stylet Placement Confirmation: ETT inserted through vocal cords under direct vision,  positive ETCO2 and breath sounds checked- equal and bilateral Secured at: 23 cm Tube secured with: Tape Dental Injury: Teeth and Oropharynx as per pre-operative assessment

## 2020-06-19 NOTE — Op Note (Signed)
Orthopaedic Surgery Operative Note (CSN: [id number])  Sheila Jordan  18-Jan-1928 Date of Surgery: 06/19/2020   Diagnoses:  Right proximal humerus fracture with axillary nerve palsy  Procedure: Right Reverse  Total Shoulder Arthroplasty   Operative Finding Successful completion of planned procedure.  Axillary neve palsy in most cases tends to be transient and assoicated with the displaced fragments from the fracture.  WE discussed proceeding with the son and patient in light of this and they proceeded especially in the setting of needing her upper extremity to attempt to mobilize.  Good fixation of tuberosities overall and axillary nerve intact at end of case on tug test.    Post-operative plan: The patient will be NWB in sling except for ADLs at waist level and use of a walker for mobility.  The patient will be Readmitted to medical service, ok for dc when PT/OT clears.  DVT prophylaxis Lovenox 40 mg/day until mobilizing and then consider transition in clinic to alternative medicines.  Pain control with PRN pain medication preferring oral medicines.  Follow up plan will be scheduled in approximately 7 days for incision check and XR.  We will hold formal therapy for shoulder until 4 weeks while monitoring axillary nerve function.  Implants: Tornier Revive size 15x9, 82m proximal body,  0 high offset tray and 39+6 poly, 39 standard glenosphere and 25 standard baseplate, 40 mm center screw.  Post-Op Diagnosis: Same Surgeons:Primary: VHiram Gash MD Assistants:Caroline McBane PA-C Location: [facility] ROOM 05 Anesthesia: General with Exparel Interscalene Antibiotics: Ancef 2g preop, Vancomycin 10033mlocally Tourniquet time: None Estimated Blood Loss: 15038omplications: None Specimens: None Implants: Implant Name Type Inv. Item Serial No. Manufacturer Lot No. LRB No. Used Action  BASEPLATE GLENOSPHERE 2533XOTD - [id number] BASEPLATE GLENOSPHERE 2500KHTD [id number] [employer]   Right 1 Implanted  GLENOSPHERE STANDARD 39 - [id number] Joint GLENOSPHERE STANDARD 39 [id number] [employer]  Right 1 Implanted  BONE SCREW THREAD 6.5X35MM - [id number] BONE SCREW THREAD 6.5X35MM  [employer]  Right 1 Implanted  SCREW 5.5X26 - [id number] SCREW 5.5X26  [employer]  Right 1 Implanted  SCREW PERIPHERAL 42 - [id number] SCREW PERIPHERAL 42  [employer]  Right 1 Implanted  STEM PTC DISTAL 15X90 - [id number] STEM PTC DISTAL 15X90 [id number] [employer]  Right 1 Implanted  IMPLANT PROX BODY PTC 15X132 - [id number] Stem IMPLANT PROX BODY PTC 15X132 [id number] [employer]  Right 1 Implanted  SCREW ASSEMBLY COCR TYPE 0 - [id number] SCREW ASSEMBLY COCR TYPE 0 [id number] INC  Right 1 Implanted  CAP LOCKING COCR - [id number] CAP LOCKING COCR [id number] [employer]  Right 1 Implanted  INSERT REV KIT SHOULDER 6X39 - [id number] INSERT REV KIT SHOULDER 6X39 [id number] [employer]  Right 1 Implanted  IMPLANT REVERSE SHOULDER 0X3.5 - [id number] IMPLANT REVERSE SHOULDER 0X3.5 [id number]  Right 1 Implanted    Indications for Surgery:   FrMEDORA ROORDAs a [age].o. female with fall resulting in a complex comminuted proximal humerus fracture and preoperative axillary nerve dysfunction to sensory testing.  Benefits and risks of operative and nonoperative management were discussed prior to surgery with patient/guardian(s) and informed consent form was completed.  Infection and need for further surgery were discussed as was prosthetic stability and cuff issues.  We additionally specifically discussed risks of axillary nerve injury, infection, periprosthetic fracture, continued pain and longevity of implants prior to beginning procedure.  We also obviously discussed that  we would have to monitor her axillary nerve function going forward.      Procedure:   The patient was identified in the preoperative holding area  where the surgical site was marked. Block placed by anesthesia with exparel.  The patient was taken to the OR where a procedural timeout was called and the above noted anesthesia was induced.  The patient was positioned beachchair on allen table with spider arm positioner.  Preoperative antibiotics were dosed.  The patient's right shoulder was prepped and draped in the usual sterile fashion.  A second preoperative timeout was called.       Standard deltopectoral approach was performed with a #10 blade. We dissected down to the subcutaneous tissues and the cephalic vein was taken laterally with the deltoid. Clavipectoral fascia was incised in line with the incision. Deep retractors were placed. The long of the biceps tendon was identified and there was significant tenosynovitis present.  Tenodesis was performed to the pectoralis tendon with #2 Ethibond. The remaining biceps was followed up into the rotator interval where it was released.    We used the bicipital groove as a landmark for the lesser and greater tuberosity fragments.  We were able to mobilize the lesser tuberosity fragment and placed stay sutures in the bone tendon junction to help with mobilization.  This point we were able to identify the  greater tuberosity fragment and 5 #5  FiberWire sutures were used to place into this for eventual repair of the tuberosities.  Once these were both mobilized we took care to identify the shaft fragment as well as the head fragment.  We carefully identified the head fragment were able to manually remove it.  At this point the axillary nerve was found and palpated and with a tug test noted to be intact.  Protected throughout the remainder of the case with blunt retractors.The humeral head did seem to be sitting low and likely impingement on the nerve preop.    We then released the SGHL with bovie cautery prior to placing a curved mayo at the junction of the anterior glenoid well above the axillary nerve and  bluntly dissecting the subscapularis from the capsule.  We then carefully protected the axillary nerve as we gently released the inferior capsule to fully mobilize the subscapularis.  An anterior deltoid retractor was then placed as well as a small Hohmann retractor superiorly.   The glenoid was relatively preserved as we would expect in this fracture patient.  The remaining labrum was removed circumferentially taking great care not to disrupt the posterior capsule.    The glenoid drill guide was placed and used to drill a guide pin in the center, inferior position. The glenoid face was then reamed concentrically over the guide wire. The center hole was drilled over the guidepin in a near anatomic angle of version. Next the glenoid vault was drilled back to a depth of 40 mm.  We tapped and then placed a 18m size baseplate with 0 lateralization was selected with a 6.5 mm x 436mlength central screw.  The base plate was screwed into the glenoid vault obtaining secure fixation. We next placed superior and inferior locking screws for additional fixation.  Next a 39 mm glenosphere was selected and impacted onto the baseplate. The center screw was tightened.   We then repositioned the arm to give access to the humeral shaft fragment.  Drill holes were placed and fiberwire sutures in the shaft for vertical fixation of the tuberosities.  We broached with the Revive stem implants  starting with a size 9 reamer and reaming up to 15 which obtained an appropriate fit.  The proximal body was sized separately and attached to trial and achieve a stable articulation. There was medial calcar involvement so we felt that a long diaphyseal stem was necessary to allow early mobilization of the patient.   We trialed with multiple size tray and polyethylene options and selected a 0 high which provided good stability and range of motion without excess soft tissue tension. The offset was dialed in to match the normal anatomy. The  shoulder was trialed.  There was good ROM in all planes and the shoulder was stable with no inferior translation.   We then mobilized her tuberosities again and placed the anterior deep limbs of the 5 #5 fiber wires around the stem.  1 of these was tied down fixing the greater tuberosity in place after bone graft harvest from the humeral head component was placed underneath.  A +0 high offset tray was selected and impacted onto the stem.   A 39+6 polyethylene liner was impacted onto the stem.  The joint was reduced and thoroughly irrigated with pulsatile lavage. The remaining sutures were then placed through the subscapularis and the bone tendon junction and the tuberosities were reduced after bone graft placed beneath as autograft at the subscap.  We horizontally secured the tuberosities before placing vertical fixation with the suture that was placed into the shaft.  Tuberosities moved as a unit were happy with her overall reduction.  This was checked on fluoroscopy confirming our position.  We irrigated copiously at this point.  Hemostasis was obtained. The deltopectoral interval was reapproximated with #1 Ethibond. The subcutaneous tissues were closed with 3-0 Vicryl and the skin was closed with running monocryl.     The wounds were cleaned and dried and an Aquacel dressing was placed. The drapes taken down. The arm was placed into sling with abduction pillow. Patient was awakened, extubated, and transferred to the recovery room in stable condition. There were no intraoperative complications. The sponge, needle, and attention counts were correct at the end of the case.    Noemi Chapel, PA-C, present and scrubbed throughout the case, critical for completion in a timely fashion, and for retraction, instrumentation, closure.

## 2020-06-20 ENCOUNTER — Encounter (HOSPITAL_COMMUNITY): Payer: Self-pay | Admitting: Orthopaedic Surgery

## 2020-06-20 LAB — BASIC METABOLIC PANEL
Anion gap: 9 (ref 5–15)
BUN: 34 mg/dL — ABNORMAL HIGH (ref 8–23)
CO2: 23 mmol/L (ref 22–32)
Calcium: 8.2 mg/dL — ABNORMAL LOW (ref 8.9–10.3)
Chloride: 107 mmol/L (ref 98–111)
Creatinine, Ser: 1.1 mg/dL — ABNORMAL HIGH (ref 0.44–1.00)
GFR calc Af Amer: 51 mL/min — ABNORMAL LOW (ref >60)
GFR calc non Af Amer: 44 mL/min — ABNORMAL LOW (ref >60)
Glucose, Bld: 162 mg/dL — ABNORMAL HIGH (ref 70–99)
Potassium: 4.1 mmol/L (ref 3.5–5.1)
Sodium: 139 mmol/L (ref 135–145)

## 2020-06-20 LAB — CBC
HCT: 26.9 % — ABNORMAL LOW (ref 36.0–46.0)
Hemoglobin: 8.8 g/dL — ABNORMAL LOW (ref 12.0–15.0)
MCH: 29.1 pg (ref 26.0–34.0)
MCHC: 32.7 g/dL (ref 30.0–36.0)
MCV: 89.1 fL (ref 80.0–100.0)
Platelets: 216 10*3/uL (ref 150–400)
RBC: 3.02 MIL/uL — ABNORMAL LOW (ref 3.87–5.11)
RDW: 14.1 % (ref 11.5–15.5)
WBC: 7.5 10*3/uL (ref 4.0–10.5)
nRBC: 0 % (ref 0.0–0.2)

## 2020-06-20 NOTE — Progress Notes (Signed)
HOSPITAL MEDICINE OVERNIGHT EVENT NOTE    Notified by nursing patient went to OR yesterday and has had difficulty urinating throughout the evening.  Bladder scan revealing 523cc with patient unable to void.  Will order intermittent cath for now, observe for recurrence.   Marinda Elk  MD [facility]

## 2020-06-20 NOTE — Anesthesia Postprocedure Evaluation (Addendum)
Anesthesia Post Note  Patient: Sheila Jordan  Procedure(s) Performed: REVERSE TOTAL SHOULDER ARTHROPLASTY (Right Shoulder)     Patient location during evaluation: PACU Anesthesia Type: Regional and General Level of consciousness: awake and alert Pain management: pain level controlled Vital Signs Assessment: post-procedure vital signs reviewed and stable Respiratory status: spontaneous breathing, nonlabored ventilation, respiratory function stable and patient connected to nasal cannula oxygen Cardiovascular status: blood pressure returned to baseline and stable Postop Assessment: no apparent nausea or vomiting Anesthetic complications: no   No complications documented.  Last Vitals:  Vitals:   06/19/20 2043 06/20/20 0548  BP: 138/69 121/61  Pulse: 89 67  Resp: 16 19  Temp: 36.9 C 37 C  SpO2: 93% 92%    Last Pain:  Vitals:   06/20/20 0548  TempSrc: Oral  PainSc:                  Opal Dinning

## 2020-06-20 NOTE — Progress Notes (Signed)
   ORTHOPAEDIC PROGRESS NOTE  s/p Procedure(s): REVERSE TOTAL SHOULDER ARTHROPLASTY on 06/19/2020 with Dr. Everardo Pacific  SUBJECTIVE: Patient reports no pain about operative site. Nerve block still working. Numbness in hand, but some fingers starting to have sensation again. No chest pain. No SOB. No nausea/vomiting. No abdominal pain. No other complaints.  OBJECTIVE: PE: General: alert & oriented, no acute distress Right upper extremity: Dressing CDI and sling well fitting,  full and painless ROM throughout hand with DPC of 0.  Axillary nerve sensation/motor altered in setting of block and unable to be fully tested.  Distal motor and sensory altered in setting of block. Warm well perfused hand.    Vitals:   06/19/20 2043 06/20/20 0548  BP: 138/69 121/61  Pulse: 89 67  Resp: 16 19  Temp: 98.4 F (36.9 C) 98.6 F (37 C)  SpO2: 93% 92%     ASSESSMENT: Sheila Jordan is a 86 y.o. female doing well postoperatively. POD#1  Injuries: 1. Right proximal humerus four-part fracture status post reverse total shoulder  2. Right pubic ramus and sacral fractures - non op management  PLAN: Weightbearing:  - Right upper extremity: NWB in sling except for ADLs at waist level and use of a walker for mobility - Right lower extremity: WBAT  Insicional and dressing care: Reinforce dressings as needed Orthopedic device(s): Walker for assistance Showering: Post-op day #2 with assistance VTE prophylaxis: Heparin per medicine. Will transition to Aspirin BID at discharge.  Pain control: PRN pain medications, preferring oral medications  Follow - up plan: 1 week in office Contact information:  Weekdays 8-5 Alfonse Alpers PA-C [phone], After hours and holidays please check [email or web address] for group call information for [facility]  Dispo: TBD. PT/OT evals pending.   Alfonse Alpers, PA-C 06/20/2020

## 2020-06-20 NOTE — TOC Transition Note (Signed)
Transition of Care [facility]) - CM/SW Discharge Note   Patient Details  Name: Sheila Jordan MRN: [id number] Date of Birth: 06-12-1928  Transition of Care [facility]) CM/SW Contact:  Nance Pear, RN Phone Number: 06/20/2020, 2:25 PM   Clinical Narrative:    Case manager noted home health PT/OT orders for patient.  Case manager called room and spoke to [employer] daughter in regards to Medicare.gov information on home health agencies.  Daughter said they did not have a preference on home health agencies.  Case manager called [facility] home health agency who agreed to provide PT and OT services for patient.     Final next level of care: Home w Home Health Services Barriers to Discharge: No Barriers Identified   Patient Goals and CMS Choice Patient states their goals for this hospitalization and ongoing recovery are:: to go home with home health. CMS Medicare.gov Compare Post Acute Care list provided to:: Patient Represenative (must comment) (Daughter [name]) Choice offered to / list presented to : Adult Children  Discharge Placement      Discharge Plan and Services   Discharge Planning Services: CM Consult Post Acute Care Choice: Home Health               HH Arranged: PT, OT [facility] Agency: [facility] Date [facility] Agency Contacted: 06/20/20 Time HH Agency Contacted: 1425 Representative spoke with at [facility] Agency: Elnita Maxwell  Social Determinants of Health (SDOH) Interventions   Readmission Risk Interventions No flowsheet data found.
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
## 2020-06-20 NOTE — Discharge Instructions (Signed)
Ramond Marrow MD, MPH Alfonse Alpers, PA-C [facility] [address], [address] [phone] (tel)   [phone] (fax)   POST-OPERATIVE INSTRUCTIONS - TOTAL SHOULDER REPLACEMENT    WOUND CARE ? You may leave the operative dressing in place until your follow-up appointment. ? KEEP THE INCISIONS CLEAN AND DRY. ? There may be a small amount of fluid/bleeding leaking at the surgical site. This is normal after surgery.  ? If it fills with liquid or blood please call us immediately to change it for you. ? Use the provided ice machine or Ice packs as often as possible for the first 3-4 days, then as needed for pain relief.  Keep a layer of cloth or a shirt between your skin and the cooling unit to prevent frost bite as it can get very cold.  SHOWERING: - You may shower on Post-Op Day #2.  - The dressing is water resistant but do not scrub it as it may start to peel up.   - You may remove the sling for showering, but keep a water resistant pillow under the arm to keep both the  elbow and shoulder away from the body (mimicking the abduction sling).  - Gently pat the area dry.  - Do not soak the shoulder in water. Do not go swimming in the pool or ocean until your sutures are removed. - KEEP THE INCISIONS CLEAN AND DRY.  EXERCISES ? Wear the sling at all times except when doing your exercises and using your walker.  ? You may remove the sling for showering, but keep the arm across the chest or in a secondary sling.    ? Accidental/Purposeful External Rotation and shoulder flexion (reaching behind you) is to be avoided at all costs for the first month. ? It is ok to come out of your sling if your are sitting and have assistance for eating.   ? Do not lift anything heavy with this arm until we discuss it further in clinic. ? Please perform the exercises (as instructed by physical therapist):   Marland Kitchen Elbow / Hand / Wrist  Range of Motion Exercises . Grip strengthening    REGIONAL ANESTHESIA (NERVE BLOCKS) . The anesthesia team may have performed a nerve block for you if safe in the setting of your care.  This is a great tool used to minimize pain.  Typically the block may start wearing off overnight but the long acting medicine may last for 3-4 days.  The nerve block wearing off can be a challenging period but please utilize your as needed pain medications to try and manage this period.    POST-OP MEDICATIONS- Multimodal approach to pain control . In general your pain will be controlled with a combination of substances.  Prescriptions unless otherwise discussed are electronically sent to your pharmacy.  This is a carefully made plan we use to minimize narcotic use.     ? Acetaminophen - Non-narcotic pain medicine taken on a scheduled basis  ? Oxycodone - This is a strong narcotic, to be used only on an "as needed" basis for pain. ? Aspirin 81mg  - This medicine is used to minimize the risk of blood clots after surgery. ? Omeprazole - daily medicine to protect your stomach while taking anti-inflammatories.  This may only be used in higher risk patients. ? Zofran -  take as needed for nausea  FOLLOW-UP ? If you develop a Fever (>101.5), Redness or Drainage from the surgical incision site, please call our  office to arrange for an evaluation. ? Please call the office to schedule a follow-up appointment for a wound check, 7-10 days post-operatively.  IF YOU HAVE ANY QUESTIONS, PLEASE FEEL FREE TO CALL OUR OFFICE.  HELPFUL INFORMATION  . If you had a block, it will wear off between 8-24 hrs postop typically.  This is period when your pain may go from nearly zero to the pain you would have had post-op without the block.  This is an abrupt transition but nothing dangerous is happening.  You may take an extra dose of narcotic when this happens.  ? Your arm will be in a sling following surgery. You will be in this sling for the next 3-4 weeks.  I will let you know the  exact duration at your follow-up visit.  ? You may be more comfortable sleeping in a semi-seated position the first few nights following surgery.  Keep a pillow propped under the elbow and forearm for comfort.  If you have a recliner type of chair it might be beneficial.  If not that is fine too, but it would be helpful to sleep propped up with pillows behind your operated shoulder as well under your elbow and forearm.  This will reduce pulling on the suture lines.  ? When dressing, put your operative arm in the sleeve first.  When getting undressed, take your operative arm out last.  Loose fitting, button-down shirts are recommended.  ? In most states it is against the law to drive while your arm is in a sling. And certainly against the law to drive while taking narcotics.  ? You may return to work/school in the next couple of days when you feel up to it. Desk work and typing in the sling is fine.  ? We suggest you use the pain medication the first night prior to going to bed, in order to ease any pain when the anesthesia wears off. You should avoid taking pain medications on an empty stomach as it will make you nauseous.  ? Do not drink alcoholic beverages or take illicit drugs when taking pain medications.  ? Pain medication may make you constipated.  Below are a few solutions to try in this order: - Decrease the amount of pain medication if you aren't having pain. - Drink lots of decaffeinated fluids. - Drink prune juice and/or each dried prunes  o If the first 3 don't work start with additional solutions - Take Colace - an over-the-counter stool softener - Take Senokot - an over-the-counter laxative - Take Miralax - a stronger over-the-counter laxative   Dental Antibiotics:  In most cases prophylactic antibiotics for Dental procdeures after total joint surgery are not necessary.  Exceptions are as follows:  1. History of prior total joint infection  2. Severely immunocompromised  (Organ Transplant, cancer chemotherapy, Rheumatoid biologic meds such as Humera)  3. Poorly controlled diabetes (A1C &gt; 8.0, blood glucose over 200)  If you have one of these conditions, contact your surgeon for an antibiotic prescription, prior to your dental procedure.
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
## 2020-06-20 NOTE — Care Management Important Message (Signed)
Important Message  Patient Details  Name: Sheila Jordan MRN: [id number] Date of Birth: Nov 01, 1927   Medicare Important Message Given:  Yes     Renie Ora 06/20/2020, 10:52 AM

## 2020-06-20 NOTE — Plan of Care (Signed)
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
## 2020-06-21 LAB — CBC
HCT: 25.4 % — ABNORMAL LOW (ref 36.0–46.0)
Hemoglobin: 8.2 g/dL — ABNORMAL LOW (ref 12.0–15.0)
MCH: 29.1 pg (ref 26.0–34.0)
MCHC: 32.3 g/dL (ref 30.0–36.0)
MCV: 90.1 fL (ref 80.0–100.0)
Platelets: 226 10*3/uL (ref 150–400)
RBC: 2.82 MIL/uL — ABNORMAL LOW (ref 3.87–5.11)
RDW: 14.4 % (ref 11.5–15.5)
WBC: 9.3 10*3/uL (ref 4.0–10.5)
nRBC: 0 % (ref 0.0–0.2)

## 2020-06-21 MED ORDER — ACETAMINOPHEN 500 MG PO TABS: 1000.0000 mg | ORAL_TABLET | Freq: Three times a day (TID) | ORAL | 0 refills | Status: AC

## 2020-06-21 MED ORDER — ONDANSETRON HCL 4 MG PO TABS: 4.0000 mg | ORAL_TABLET | Freq: Three times a day (TID) | ORAL | 1 refills | Status: AC | PRN

## 2020-06-21 MED ORDER — OXYCODONE HCL 5 MG PO TABS: ORAL_TABLET | ORAL | 0 refills | Status: AC

## 2020-06-21 MED ORDER — OMEPRAZOLE 20 MG PO CPDR: 20.0000 mg | DELAYED_RELEASE_CAPSULE | Freq: Every day | ORAL | 0 refills | Status: DC

## 2020-06-21 MED ORDER — ASPIRIN 81 MG PO CHEW: 81.0000 mg | CHEWABLE_TABLET | Freq: Two times a day (BID) | ORAL | 0 refills | Status: AC

## 2020-06-21 MED ORDER — FERROUS SULFATE 325 (65 FE) MG PO TBEC: 325.0000 mg | DELAYED_RELEASE_TABLET | Freq: Two times a day (BID) | ORAL | 1 refills | Status: DC

## 2020-06-21 MED FILL — ONDANSETRON HCL 4 MG TABLET: 4 | 4 days supply | Qty: 10 | Fill #0

## 2020-06-21 MED FILL — oxyCODONE HCL 5 MG TABS: 5 | 5 days supply | Qty: 30 | Fill #0

## 2020-06-21 MED FILL — OMEPRAZOLE 20 MG CPDR: 20 | 30 days supply | Qty: 30 | Fill #0

## 2020-06-21 MED FILL — ACETAMINOPHEN 500MG XT STRE: 500 | 14 days supply | Qty: 84 | Fill #0

## 2020-06-21 MED FILL — ASPIRIN LOW DOSE 81 MG CHEW: 81 | 42 days supply | Qty: 84 | Fill #0
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
## 2020-06-21 NOTE — Progress Notes (Signed)
   ORTHOPAEDIC PROGRESS NOTE  s/p Procedure(s): REVERSE TOTAL SHOULDER ARTHROPLASTY on 06/19/2020 with Dr. Everardo Pacific  SUBJECTIVE: Patient reports some pain in her right shoulder. Nerve block wearing off. Did well with therapy yesterday. No chest pain. No SOB. No nausea/vomiting. No abdominal pain. No other complaints.  OBJECTIVE: PE: General: alert & oriented, no acute distress Right upper extremity: Dressing CDI and sling well fitting,  full and painless ROM throughout hand with DPC of 0. + Motor in  AIN, PIN, Ulnar distributions. She endorses axillary nerve sensation that is symmetric to contralateral side.  Sensation intact in medial, radial, and ulnar distributions. Well perfused digits.    Vitals:   06/21/20 0024 06/21/20 0500  BP: (!) 129/54 (!) 133/56  Pulse: 89 84  Resp: (!) 21 19  Temp: 98.1 F (36.7 C) 98.2 F (36.8 C)  SpO2: 91% 92%     ASSESSMENT: Sheila Jordan is a 86 y.o. female doing well postoperatively. POD#2  Injuries: 1. Right proximal humerus four-part fracture status post reverse total shoulder  2. Right pubic ramus and sacral fractures - non op management  PLAN: Weightbearing:  - Right upper extremity: NWB in sling except for ADLs at waist level and use of a walker for mobility - Right lower extremity: WBAT  Insicional and dressing care: Reinforce dressings as needed Orthopedic device(s): Walker for assistance  Showering: Post-op day #2 with assistance VTE prophylaxis: Heparin per medicine. Will transition to Aspirin BID at discharge.  Pain control: PRN pain medications, preferring oral medications  Follow - up plan: 1 week in office Contact information:  Weekdays 8-5 Alfonse Alpers PA-C [phone], After hours and holidays please check [email or web address] for group call information for [facility]  Dispo: PT/OT recommending home health. Cleared for discharge from orthopedics standpoint once cleared by medicine team and therapies.  Will [facility].   Alfonse Alpers, PA-C 06/21/2020

## 2020-06-21 NOTE — Plan of Care (Signed)
  Problem: Clinical Measurements: Goal: Ability to maintain clinical measurements within normal limits will improve Outcome: Progressing   Problem: Clinical Measurements: Goal: Will remain free from infection Outcome: Progressing   

## 2020-06-26 ENCOUNTER — Other Ambulatory Visit: Payer: Self-pay | Admitting: *Deleted

## 2020-06-26 DIAGNOSIS — E876 Hypokalemia: Secondary | ICD-10-CM | POA: Diagnosis not present

## 2020-06-26 DIAGNOSIS — S3289XA Fracture of other parts of pelvis, initial encounter for closed fracture: Secondary | ICD-10-CM | POA: Diagnosis not present

## 2020-06-26 DIAGNOSIS — Z79899 Other long term (current) drug therapy: Secondary | ICD-10-CM | POA: Diagnosis not present

## 2020-06-26 DIAGNOSIS — D631 Anemia in chronic kidney disease: Secondary | ICD-10-CM | POA: Diagnosis not present

## 2020-06-26 DIAGNOSIS — S42351D Displaced comminuted fracture of shaft of humerus, right arm, subsequent encounter for fracture with routine healing: Secondary | ICD-10-CM | POA: Diagnosis not present

## 2020-06-26 DIAGNOSIS — N1832 Chronic kidney disease, stage 3b: Secondary | ICD-10-CM | POA: Diagnosis not present

## 2020-06-26 DIAGNOSIS — N183 Chronic kidney disease, stage 3 unspecified: Secondary | ICD-10-CM | POA: Diagnosis not present

## 2020-06-26 DIAGNOSIS — I129 Hypertensive chronic kidney disease with stage 1 through stage 4 chronic kidney disease, or unspecified chronic kidney disease: Secondary | ICD-10-CM | POA: Diagnosis not present

## 2020-06-26 DIAGNOSIS — S3219XD Other fracture of sacrum, subsequent encounter for fracture with routine healing: Secondary | ICD-10-CM | POA: Diagnosis not present

## 2020-06-26 DIAGNOSIS — Z96611 Presence of right artificial shoulder joint: Secondary | ICD-10-CM | POA: Diagnosis not present

## 2020-06-26 DIAGNOSIS — S32501D Unspecified fracture of right pubis, subsequent encounter for fracture with routine healing: Secondary | ICD-10-CM | POA: Diagnosis not present

## 2020-06-26 DIAGNOSIS — D649 Anemia, unspecified: Secondary | ICD-10-CM | POA: Diagnosis not present
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
## 2020-06-26 NOTE — Anesthesia Procedure Notes (Signed)
Anesthesia Regional Block: Interscalene brachial plexus block   Pre-Anesthetic Checklist: ,, timeout performed, Correct Patient, Correct Site, Correct Laterality, Correct Procedure, Correct Position, site marked, Risks and benefits discussed,  Surgical consent,  Pre-op evaluation,  At surgeon's request and post-op pain management  Laterality: Right and Upper  Prep: chloraprep       Needles:  Injection technique: Single-shot     Needle Length: 5cm  Needle Gauge: 22     Additional Needles: Arrow StimuQuik ECHO Echogenic Stimulating PNB Needle  Procedures:,,,, ultrasound used (permanent image in chart),,,,  Narrative:  Start time: 06/19/2020 12:51 PM End time: 06/19/2020 12:55 PM Injection made incrementally with aspirations every 5 mL.  Performed by: Personally  Anesthesiologist: Val Eagle, MD

## 2020-06-26 NOTE — Addendum Note (Signed)
Addendum  created 06/26/20 1236 by Val Eagle, MD   Child order released for a procedure order, Clinical Note Signed, Intraprocedure Blocks edited

## 2020-06-27 DIAGNOSIS — M25551 Pain in right hip: Secondary | ICD-10-CM | POA: Diagnosis not present

## 2020-06-27 DIAGNOSIS — S42291D Other displaced fracture of upper end of right humerus, subsequent encounter for fracture with routine healing: Secondary | ICD-10-CM | POA: Diagnosis not present

## 2020-06-30 DIAGNOSIS — S32501D Unspecified fracture of right pubis, subsequent encounter for fracture with routine healing: Secondary | ICD-10-CM | POA: Diagnosis not present

## 2020-06-30 DIAGNOSIS — D631 Anemia in chronic kidney disease: Secondary | ICD-10-CM | POA: Diagnosis not present

## 2020-06-30 DIAGNOSIS — N1832 Chronic kidney disease, stage 3b: Secondary | ICD-10-CM | POA: Diagnosis not present

## 2020-06-30 DIAGNOSIS — S3219XD Other fracture of sacrum, subsequent encounter for fracture with routine healing: Secondary | ICD-10-CM | POA: Diagnosis not present

## 2020-06-30 DIAGNOSIS — I129 Hypertensive chronic kidney disease with stage 1 through stage 4 chronic kidney disease, or unspecified chronic kidney disease: Secondary | ICD-10-CM | POA: Diagnosis not present

## 2020-06-30 DIAGNOSIS — S42351D Displaced comminuted fracture of shaft of humerus, right arm, subsequent encounter for fracture with routine healing: Secondary | ICD-10-CM | POA: Diagnosis not present

## 2020-07-04 DIAGNOSIS — N1832 Chronic kidney disease, stage 3b: Secondary | ICD-10-CM | POA: Diagnosis not present

## 2020-07-04 DIAGNOSIS — S42351D Displaced comminuted fracture of shaft of humerus, right arm, subsequent encounter for fracture with routine healing: Secondary | ICD-10-CM | POA: Diagnosis not present

## 2020-07-04 DIAGNOSIS — D631 Anemia in chronic kidney disease: Secondary | ICD-10-CM | POA: Diagnosis not present

## 2020-07-04 DIAGNOSIS — S3219XD Other fracture of sacrum, subsequent encounter for fracture with routine healing: Secondary | ICD-10-CM | POA: Diagnosis not present

## 2020-07-04 DIAGNOSIS — S32501D Unspecified fracture of right pubis, subsequent encounter for fracture with routine healing: Secondary | ICD-10-CM | POA: Diagnosis not present

## 2020-07-04 DIAGNOSIS — I129 Hypertensive chronic kidney disease with stage 1 through stage 4 chronic kidney disease, or unspecified chronic kidney disease: Secondary | ICD-10-CM | POA: Diagnosis not present

## 2020-07-07 DIAGNOSIS — D631 Anemia in chronic kidney disease: Secondary | ICD-10-CM | POA: Diagnosis not present

## 2020-07-07 DIAGNOSIS — N1832 Chronic kidney disease, stage 3b: Secondary | ICD-10-CM | POA: Diagnosis not present

## 2020-07-07 DIAGNOSIS — I129 Hypertensive chronic kidney disease with stage 1 through stage 4 chronic kidney disease, or unspecified chronic kidney disease: Secondary | ICD-10-CM | POA: Diagnosis not present

## 2020-07-07 DIAGNOSIS — S32501D Unspecified fracture of right pubis, subsequent encounter for fracture with routine healing: Secondary | ICD-10-CM | POA: Diagnosis not present

## 2020-07-07 DIAGNOSIS — S3219XD Other fracture of sacrum, subsequent encounter for fracture with routine healing: Secondary | ICD-10-CM | POA: Diagnosis not present

## 2020-07-07 DIAGNOSIS — S42351D Displaced comminuted fracture of shaft of humerus, right arm, subsequent encounter for fracture with routine healing: Secondary | ICD-10-CM | POA: Diagnosis not present

## 2020-07-11 DIAGNOSIS — I129 Hypertensive chronic kidney disease with stage 1 through stage 4 chronic kidney disease, or unspecified chronic kidney disease: Secondary | ICD-10-CM | POA: Diagnosis not present

## 2020-07-11 DIAGNOSIS — N1832 Chronic kidney disease, stage 3b: Secondary | ICD-10-CM | POA: Diagnosis not present

## 2020-07-11 DIAGNOSIS — D631 Anemia in chronic kidney disease: Secondary | ICD-10-CM | POA: Diagnosis not present

## 2020-07-11 DIAGNOSIS — R609 Edema, unspecified: Secondary | ICD-10-CM | POA: Diagnosis not present

## 2020-07-11 DIAGNOSIS — S3219XD Other fracture of sacrum, subsequent encounter for fracture with routine healing: Secondary | ICD-10-CM | POA: Diagnosis not present

## 2020-07-11 DIAGNOSIS — S32501D Unspecified fracture of right pubis, subsequent encounter for fracture with routine healing: Secondary | ICD-10-CM | POA: Diagnosis not present

## 2020-07-11 DIAGNOSIS — S42351D Displaced comminuted fracture of shaft of humerus, right arm, subsequent encounter for fracture with routine healing: Secondary | ICD-10-CM | POA: Diagnosis not present

## 2020-07-14 ENCOUNTER — Other Ambulatory Visit: Payer: Self-pay | Admitting: *Deleted

## 2020-07-14 DIAGNOSIS — S32501D Unspecified fracture of right pubis, subsequent encounter for fracture with routine healing: Secondary | ICD-10-CM | POA: Diagnosis not present

## 2020-07-14 DIAGNOSIS — S42351D Displaced comminuted fracture of shaft of humerus, right arm, subsequent encounter for fracture with routine healing: Secondary | ICD-10-CM | POA: Diagnosis not present

## 2020-07-14 DIAGNOSIS — S3219XD Other fracture of sacrum, subsequent encounter for fracture with routine healing: Secondary | ICD-10-CM | POA: Diagnosis not present

## 2020-07-14 DIAGNOSIS — N1832 Chronic kidney disease, stage 3b: Secondary | ICD-10-CM | POA: Diagnosis not present

## 2020-07-14 DIAGNOSIS — D631 Anemia in chronic kidney disease: Secondary | ICD-10-CM | POA: Diagnosis not present

## 2020-07-14 DIAGNOSIS — I129 Hypertensive chronic kidney disease with stage 1 through stage 4 chronic kidney disease, or unspecified chronic kidney disease: Secondary | ICD-10-CM | POA: Diagnosis not present

## 2020-07-14 NOTE — Patient Outreach (Signed)
[facility]) Care Management  07/14/2020  BETHANEE REDONDO 1927-10-14 [id number]   Telephone outreach to follow up for possible [facility] Care Management services. Pt has red flag on Emmi call 2 weeks ago which NP assisted in resolution. We had discussed servcies and Pt and daughter agreed to receive educational materials.  Unanswered phone today, left a message for a return call.  Zara Council. Burgess Estelle, MSN, [facility] Gerontological Nurse Practitioner [facility] Care Management [phone]

## 2020-07-17 DIAGNOSIS — D631 Anemia in chronic kidney disease: Secondary | ICD-10-CM | POA: Diagnosis not present

## 2020-07-17 DIAGNOSIS — I129 Hypertensive chronic kidney disease with stage 1 through stage 4 chronic kidney disease, or unspecified chronic kidney disease: Secondary | ICD-10-CM | POA: Diagnosis not present

## 2020-07-17 DIAGNOSIS — N1832 Chronic kidney disease, stage 3b: Secondary | ICD-10-CM | POA: Diagnosis not present

## 2020-07-17 DIAGNOSIS — S3219XD Other fracture of sacrum, subsequent encounter for fracture with routine healing: Secondary | ICD-10-CM | POA: Diagnosis not present

## 2020-07-17 DIAGNOSIS — S42351D Displaced comminuted fracture of shaft of humerus, right arm, subsequent encounter for fracture with routine healing: Secondary | ICD-10-CM | POA: Diagnosis not present

## 2020-07-17 DIAGNOSIS — S32501D Unspecified fracture of right pubis, subsequent encounter for fracture with routine healing: Secondary | ICD-10-CM | POA: Diagnosis not present

## 2020-07-18 DIAGNOSIS — S42291D Other displaced fracture of upper end of right humerus, subsequent encounter for fracture with routine healing: Secondary | ICD-10-CM | POA: Diagnosis not present

## 2020-07-18 DIAGNOSIS — M25551 Pain in right hip: Secondary | ICD-10-CM | POA: Diagnosis not present

## 2020-07-18 DIAGNOSIS — R609 Edema, unspecified: Secondary | ICD-10-CM | POA: Diagnosis not present

## 2020-07-20 DIAGNOSIS — S3219XD Other fracture of sacrum, subsequent encounter for fracture with routine healing: Secondary | ICD-10-CM | POA: Diagnosis not present

## 2020-07-20 DIAGNOSIS — S42351D Displaced comminuted fracture of shaft of humerus, right arm, subsequent encounter for fracture with routine healing: Secondary | ICD-10-CM | POA: Diagnosis not present

## 2020-07-20 DIAGNOSIS — D631 Anemia in chronic kidney disease: Secondary | ICD-10-CM | POA: Diagnosis not present

## 2020-07-20 DIAGNOSIS — S32501D Unspecified fracture of right pubis, subsequent encounter for fracture with routine healing: Secondary | ICD-10-CM | POA: Diagnosis not present

## 2020-07-20 DIAGNOSIS — I129 Hypertensive chronic kidney disease with stage 1 through stage 4 chronic kidney disease, or unspecified chronic kidney disease: Secondary | ICD-10-CM | POA: Diagnosis not present

## 2020-07-20 DIAGNOSIS — N1832 Chronic kidney disease, stage 3b: Secondary | ICD-10-CM | POA: Diagnosis not present

## 2020-07-21 ENCOUNTER — Other Ambulatory Visit: Payer: Self-pay | Admitting: *Deleted

## 2020-07-21 NOTE — Patient Outreach (Signed)
[facility]) Care Management  07/21/2020  ARNITRA SOKOLOSKI 28-Jun-1928 [id number]  Last outreach to engage for care management, no answer, left a message to call NP if they would like [facility] services, this is my last call. Letter has been sent previously after my first discussion with [name], listed relative.  Zara Council. Burgess Estelle, MSN, [facility] Gerontological Nurse Practitioner [facility] Care Management [phone]

## 2020-07-26 DIAGNOSIS — S32501D Unspecified fracture of right pubis, subsequent encounter for fracture with routine healing: Secondary | ICD-10-CM | POA: Diagnosis not present

## 2020-07-26 DIAGNOSIS — S42291D Other displaced fracture of upper end of right humerus, subsequent encounter for fracture with routine healing: Secondary | ICD-10-CM | POA: Diagnosis not present

## 2020-07-26 DIAGNOSIS — N1832 Chronic kidney disease, stage 3b: Secondary | ICD-10-CM | POA: Diagnosis not present

## 2020-07-26 DIAGNOSIS — I129 Hypertensive chronic kidney disease with stage 1 through stage 4 chronic kidney disease, or unspecified chronic kidney disease: Secondary | ICD-10-CM | POA: Diagnosis not present

## 2020-07-26 DIAGNOSIS — S42351D Displaced comminuted fracture of shaft of humerus, right arm, subsequent encounter for fracture with routine healing: Secondary | ICD-10-CM | POA: Diagnosis not present

## 2020-07-26 DIAGNOSIS — S3219XD Other fracture of sacrum, subsequent encounter for fracture with routine healing: Secondary | ICD-10-CM | POA: Diagnosis not present

## 2020-07-26 DIAGNOSIS — D631 Anemia in chronic kidney disease: Secondary | ICD-10-CM | POA: Diagnosis not present

## 2020-07-31 DIAGNOSIS — I129 Hypertensive chronic kidney disease with stage 1 through stage 4 chronic kidney disease, or unspecified chronic kidney disease: Secondary | ICD-10-CM | POA: Diagnosis not present

## 2020-07-31 DIAGNOSIS — S3219XD Other fracture of sacrum, subsequent encounter for fracture with routine healing: Secondary | ICD-10-CM | POA: Diagnosis not present

## 2020-07-31 DIAGNOSIS — S32501D Unspecified fracture of right pubis, subsequent encounter for fracture with routine healing: Secondary | ICD-10-CM | POA: Diagnosis not present

## 2020-07-31 DIAGNOSIS — D631 Anemia in chronic kidney disease: Secondary | ICD-10-CM | POA: Diagnosis not present

## 2020-07-31 DIAGNOSIS — S42351D Displaced comminuted fracture of shaft of humerus, right arm, subsequent encounter for fracture with routine healing: Secondary | ICD-10-CM | POA: Diagnosis not present

## 2020-07-31 DIAGNOSIS — N1832 Chronic kidney disease, stage 3b: Secondary | ICD-10-CM | POA: Diagnosis not present

## 2020-08-08 DIAGNOSIS — S3219XD Other fracture of sacrum, subsequent encounter for fracture with routine healing: Secondary | ICD-10-CM | POA: Diagnosis not present

## 2020-08-08 DIAGNOSIS — S32501D Unspecified fracture of right pubis, subsequent encounter for fracture with routine healing: Secondary | ICD-10-CM | POA: Diagnosis not present

## 2020-08-08 DIAGNOSIS — D631 Anemia in chronic kidney disease: Secondary | ICD-10-CM | POA: Diagnosis not present

## 2020-08-08 DIAGNOSIS — S42351D Displaced comminuted fracture of shaft of humerus, right arm, subsequent encounter for fracture with routine healing: Secondary | ICD-10-CM | POA: Diagnosis not present

## 2020-08-08 DIAGNOSIS — N1832 Chronic kidney disease, stage 3b: Secondary | ICD-10-CM | POA: Diagnosis not present

## 2020-08-08 DIAGNOSIS — I129 Hypertensive chronic kidney disease with stage 1 through stage 4 chronic kidney disease, or unspecified chronic kidney disease: Secondary | ICD-10-CM | POA: Diagnosis not present

## 2020-08-15 DIAGNOSIS — S32501D Unspecified fracture of right pubis, subsequent encounter for fracture with routine healing: Secondary | ICD-10-CM | POA: Diagnosis not present

## 2020-08-15 DIAGNOSIS — D631 Anemia in chronic kidney disease: Secondary | ICD-10-CM | POA: Diagnosis not present

## 2020-08-15 DIAGNOSIS — I129 Hypertensive chronic kidney disease with stage 1 through stage 4 chronic kidney disease, or unspecified chronic kidney disease: Secondary | ICD-10-CM | POA: Diagnosis not present

## 2020-08-15 DIAGNOSIS — S42351D Displaced comminuted fracture of shaft of humerus, right arm, subsequent encounter for fracture with routine healing: Secondary | ICD-10-CM | POA: Diagnosis not present

## 2020-08-15 DIAGNOSIS — N1832 Chronic kidney disease, stage 3b: Secondary | ICD-10-CM | POA: Diagnosis not present

## 2020-08-15 DIAGNOSIS — S3219XD Other fracture of sacrum, subsequent encounter for fracture with routine healing: Secondary | ICD-10-CM | POA: Diagnosis not present

## 2020-08-21 DIAGNOSIS — N1832 Chronic kidney disease, stage 3b: Secondary | ICD-10-CM | POA: Diagnosis not present

## 2020-08-21 DIAGNOSIS — S32501D Unspecified fracture of right pubis, subsequent encounter for fracture with routine healing: Secondary | ICD-10-CM | POA: Diagnosis not present

## 2020-08-21 DIAGNOSIS — I129 Hypertensive chronic kidney disease with stage 1 through stage 4 chronic kidney disease, or unspecified chronic kidney disease: Secondary | ICD-10-CM | POA: Diagnosis not present

## 2020-08-21 DIAGNOSIS — D631 Anemia in chronic kidney disease: Secondary | ICD-10-CM | POA: Diagnosis not present

## 2020-08-21 DIAGNOSIS — S3219XD Other fracture of sacrum, subsequent encounter for fracture with routine healing: Secondary | ICD-10-CM | POA: Diagnosis not present

## 2020-08-21 DIAGNOSIS — S42351D Displaced comminuted fracture of shaft of humerus, right arm, subsequent encounter for fracture with routine healing: Secondary | ICD-10-CM | POA: Diagnosis not present

## 2020-08-28 DIAGNOSIS — R609 Edema, unspecified: Secondary | ICD-10-CM | POA: Diagnosis not present

## 2020-08-28 DIAGNOSIS — S3289XA Fracture of other parts of pelvis, initial encounter for closed fracture: Secondary | ICD-10-CM | POA: Diagnosis not present

## 2020-08-28 DIAGNOSIS — D649 Anemia, unspecified: Secondary | ICD-10-CM | POA: Diagnosis not present

## 2020-08-28 DIAGNOSIS — Z23 Encounter for immunization: Secondary | ICD-10-CM | POA: Diagnosis not present

## 2020-08-30 DIAGNOSIS — S42351D Displaced comminuted fracture of shaft of humerus, right arm, subsequent encounter for fracture with routine healing: Secondary | ICD-10-CM | POA: Diagnosis not present

## 2020-08-30 DIAGNOSIS — S32501D Unspecified fracture of right pubis, subsequent encounter for fracture with routine healing: Secondary | ICD-10-CM | POA: Diagnosis not present

## 2020-08-30 DIAGNOSIS — I129 Hypertensive chronic kidney disease with stage 1 through stage 4 chronic kidney disease, or unspecified chronic kidney disease: Secondary | ICD-10-CM | POA: Diagnosis not present

## 2020-08-30 DIAGNOSIS — S3219XD Other fracture of sacrum, subsequent encounter for fracture with routine healing: Secondary | ICD-10-CM | POA: Diagnosis not present

## 2020-08-30 DIAGNOSIS — Z79891 Long term (current) use of opiate analgesic: Secondary | ICD-10-CM | POA: Diagnosis not present

## 2020-08-30 DIAGNOSIS — Z96611 Presence of right artificial shoulder joint: Secondary | ICD-10-CM | POA: Diagnosis not present

## 2020-08-30 DIAGNOSIS — N1832 Chronic kidney disease, stage 3b: Secondary | ICD-10-CM | POA: Diagnosis not present

## 2020-08-30 DIAGNOSIS — D631 Anemia in chronic kidney disease: Secondary | ICD-10-CM | POA: Diagnosis not present

## 2020-09-05 DIAGNOSIS — I129 Hypertensive chronic kidney disease with stage 1 through stage 4 chronic kidney disease, or unspecified chronic kidney disease: Secondary | ICD-10-CM | POA: Diagnosis not present

## 2020-09-05 DIAGNOSIS — Z79891 Long term (current) use of opiate analgesic: Secondary | ICD-10-CM | POA: Diagnosis not present

## 2020-09-05 DIAGNOSIS — Z96611 Presence of right artificial shoulder joint: Secondary | ICD-10-CM | POA: Diagnosis not present

## 2020-09-05 DIAGNOSIS — S3219XD Other fracture of sacrum, subsequent encounter for fracture with routine healing: Secondary | ICD-10-CM | POA: Diagnosis not present

## 2020-09-05 DIAGNOSIS — S42351D Displaced comminuted fracture of shaft of humerus, right arm, subsequent encounter for fracture with routine healing: Secondary | ICD-10-CM | POA: Diagnosis not present

## 2020-09-05 DIAGNOSIS — S32501D Unspecified fracture of right pubis, subsequent encounter for fracture with routine healing: Secondary | ICD-10-CM | POA: Diagnosis not present

## 2020-09-05 DIAGNOSIS — D631 Anemia in chronic kidney disease: Secondary | ICD-10-CM | POA: Diagnosis not present

## 2020-09-05 DIAGNOSIS — N1832 Chronic kidney disease, stage 3b: Secondary | ICD-10-CM | POA: Diagnosis not present

## 2020-09-12 DIAGNOSIS — I129 Hypertensive chronic kidney disease with stage 1 through stage 4 chronic kidney disease, or unspecified chronic kidney disease: Secondary | ICD-10-CM | POA: Diagnosis not present

## 2020-09-12 DIAGNOSIS — S42351D Displaced comminuted fracture of shaft of humerus, right arm, subsequent encounter for fracture with routine healing: Secondary | ICD-10-CM | POA: Diagnosis not present

## 2020-09-12 DIAGNOSIS — N1832 Chronic kidney disease, stage 3b: Secondary | ICD-10-CM | POA: Diagnosis not present

## 2020-09-12 DIAGNOSIS — Z96611 Presence of right artificial shoulder joint: Secondary | ICD-10-CM | POA: Diagnosis not present

## 2020-09-12 DIAGNOSIS — D631 Anemia in chronic kidney disease: Secondary | ICD-10-CM | POA: Diagnosis not present

## 2020-09-12 DIAGNOSIS — S32501D Unspecified fracture of right pubis, subsequent encounter for fracture with routine healing: Secondary | ICD-10-CM | POA: Diagnosis not present

## 2020-09-12 DIAGNOSIS — S3219XD Other fracture of sacrum, subsequent encounter for fracture with routine healing: Secondary | ICD-10-CM | POA: Diagnosis not present

## 2020-09-12 DIAGNOSIS — Z79891 Long term (current) use of opiate analgesic: Secondary | ICD-10-CM | POA: Diagnosis not present

## 2020-09-13 DIAGNOSIS — I10 Essential (primary) hypertension: Secondary | ICD-10-CM | POA: Diagnosis not present

## 2020-09-13 DIAGNOSIS — R600 Localized edema: Secondary | ICD-10-CM | POA: Diagnosis not present

## 2020-09-13 DIAGNOSIS — D649 Anemia, unspecified: Secondary | ICD-10-CM | POA: Diagnosis not present

## 2020-09-13 DIAGNOSIS — Z79899 Other long term (current) drug therapy: Secondary | ICD-10-CM | POA: Diagnosis not present

## 2020-09-18 DIAGNOSIS — I129 Hypertensive chronic kidney disease with stage 1 through stage 4 chronic kidney disease, or unspecified chronic kidney disease: Secondary | ICD-10-CM | POA: Diagnosis not present

## 2020-09-18 DIAGNOSIS — Z79891 Long term (current) use of opiate analgesic: Secondary | ICD-10-CM | POA: Diagnosis not present

## 2020-09-18 DIAGNOSIS — N1832 Chronic kidney disease, stage 3b: Secondary | ICD-10-CM | POA: Diagnosis not present

## 2020-09-18 DIAGNOSIS — D631 Anemia in chronic kidney disease: Secondary | ICD-10-CM | POA: Diagnosis not present

## 2020-09-18 DIAGNOSIS — S3219XD Other fracture of sacrum, subsequent encounter for fracture with routine healing: Secondary | ICD-10-CM | POA: Diagnosis not present

## 2020-09-18 DIAGNOSIS — S32501D Unspecified fracture of right pubis, subsequent encounter for fracture with routine healing: Secondary | ICD-10-CM | POA: Diagnosis not present

## 2020-09-18 DIAGNOSIS — S42351D Displaced comminuted fracture of shaft of humerus, right arm, subsequent encounter for fracture with routine healing: Secondary | ICD-10-CM | POA: Diagnosis not present

## 2020-09-18 DIAGNOSIS — Z96611 Presence of right artificial shoulder joint: Secondary | ICD-10-CM | POA: Diagnosis not present

## 2020-09-19 DIAGNOSIS — M25551 Pain in right hip: Secondary | ICD-10-CM | POA: Diagnosis not present

## 2020-09-19 DIAGNOSIS — S42291D Other displaced fracture of upper end of right humerus, subsequent encounter for fracture with routine healing: Secondary | ICD-10-CM | POA: Diagnosis not present

## 2020-09-20 DIAGNOSIS — D649 Anemia, unspecified: Secondary | ICD-10-CM | POA: Diagnosis not present

## 2020-09-20 DIAGNOSIS — R609 Edema, unspecified: Secondary | ICD-10-CM | POA: Diagnosis not present

## 2020-09-21 ENCOUNTER — Ambulatory Visit: Payer: Medicare Other

## 2020-10-03 DIAGNOSIS — S32501D Unspecified fracture of right pubis, subsequent encounter for fracture with routine healing: Secondary | ICD-10-CM | POA: Diagnosis not present

## 2020-10-03 DIAGNOSIS — Z96611 Presence of right artificial shoulder joint: Secondary | ICD-10-CM | POA: Diagnosis not present

## 2020-10-03 DIAGNOSIS — S42351D Displaced comminuted fracture of shaft of humerus, right arm, subsequent encounter for fracture with routine healing: Secondary | ICD-10-CM | POA: Diagnosis not present

## 2020-10-03 DIAGNOSIS — S3219XD Other fracture of sacrum, subsequent encounter for fracture with routine healing: Secondary | ICD-10-CM | POA: Diagnosis not present

## 2020-10-03 DIAGNOSIS — D631 Anemia in chronic kidney disease: Secondary | ICD-10-CM | POA: Diagnosis not present

## 2020-10-03 DIAGNOSIS — Z79891 Long term (current) use of opiate analgesic: Secondary | ICD-10-CM | POA: Diagnosis not present

## 2020-10-03 DIAGNOSIS — I129 Hypertensive chronic kidney disease with stage 1 through stage 4 chronic kidney disease, or unspecified chronic kidney disease: Secondary | ICD-10-CM | POA: Diagnosis not present

## 2020-10-03 DIAGNOSIS — N1832 Chronic kidney disease, stage 3b: Secondary | ICD-10-CM | POA: Diagnosis not present

## 2020-10-10 DIAGNOSIS — Z79891 Long term (current) use of opiate analgesic: Secondary | ICD-10-CM | POA: Diagnosis not present

## 2020-10-10 DIAGNOSIS — S32501D Unspecified fracture of right pubis, subsequent encounter for fracture with routine healing: Secondary | ICD-10-CM | POA: Diagnosis not present

## 2020-10-10 DIAGNOSIS — I129 Hypertensive chronic kidney disease with stage 1 through stage 4 chronic kidney disease, or unspecified chronic kidney disease: Secondary | ICD-10-CM | POA: Diagnosis not present

## 2020-10-10 DIAGNOSIS — N1832 Chronic kidney disease, stage 3b: Secondary | ICD-10-CM | POA: Diagnosis not present

## 2020-10-10 DIAGNOSIS — Z96611 Presence of right artificial shoulder joint: Secondary | ICD-10-CM | POA: Diagnosis not present

## 2020-10-10 DIAGNOSIS — S3219XD Other fracture of sacrum, subsequent encounter for fracture with routine healing: Secondary | ICD-10-CM | POA: Diagnosis not present

## 2020-10-10 DIAGNOSIS — S42351D Displaced comminuted fracture of shaft of humerus, right arm, subsequent encounter for fracture with routine healing: Secondary | ICD-10-CM | POA: Diagnosis not present

## 2020-10-10 DIAGNOSIS — D631 Anemia in chronic kidney disease: Secondary | ICD-10-CM | POA: Diagnosis not present

## 2020-12-13 DIAGNOSIS — I10 Essential (primary) hypertension: Secondary | ICD-10-CM | POA: Diagnosis not present

## 2020-12-13 DIAGNOSIS — Z79899 Other long term (current) drug therapy: Secondary | ICD-10-CM | POA: Diagnosis not present

## 2020-12-13 DIAGNOSIS — M899 Disorder of bone, unspecified: Secondary | ICD-10-CM | POA: Diagnosis not present

## 2020-12-28 DIAGNOSIS — R609 Edema, unspecified: Secondary | ICD-10-CM | POA: Diagnosis not present

## 2020-12-28 DIAGNOSIS — I10 Essential (primary) hypertension: Secondary | ICD-10-CM | POA: Diagnosis not present

## 2021-03-19 DIAGNOSIS — M1711 Unilateral primary osteoarthritis, right knee: Secondary | ICD-10-CM | POA: Diagnosis not present

## 2021-03-27 DIAGNOSIS — Z79899 Other long term (current) drug therapy: Secondary | ICD-10-CM | POA: Diagnosis not present

## 2021-03-27 DIAGNOSIS — I10 Essential (primary) hypertension: Secondary | ICD-10-CM | POA: Diagnosis not present

## 2021-03-27 DIAGNOSIS — R600 Localized edema: Secondary | ICD-10-CM | POA: Diagnosis not present

## 2021-04-03 DIAGNOSIS — I10 Essential (primary) hypertension: Secondary | ICD-10-CM | POA: Diagnosis not present

## 2021-04-03 DIAGNOSIS — R609 Edema, unspecified: Secondary | ICD-10-CM | POA: Diagnosis not present

## 2021-04-23 ENCOUNTER — Other Ambulatory Visit: Payer: Self-pay

## 2021-04-23 ENCOUNTER — Ambulatory Visit (HOSPITAL_COMMUNITY): Payer: Medicare Other | Attending: Sports Medicine | Admitting: Physical Therapy

## 2021-04-23 DIAGNOSIS — I890 Lymphedema, not elsewhere classified: Secondary | ICD-10-CM | POA: Diagnosis not present
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
## 2021-04-25 ENCOUNTER — Encounter (HOSPITAL_COMMUNITY): Payer: Self-pay

## 2021-04-25 ENCOUNTER — Ambulatory Visit (HOSPITAL_COMMUNITY): Payer: Medicare Other

## 2021-04-25 ENCOUNTER — Other Ambulatory Visit: Payer: Self-pay

## 2021-04-25 DIAGNOSIS — I890 Lymphedema, not elsewhere classified: Secondary | ICD-10-CM | POA: Diagnosis not present
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
## 2021-04-27 ENCOUNTER — Encounter (HOSPITAL_COMMUNITY): Payer: Self-pay | Admitting: Physical Therapy

## 2021-04-27 ENCOUNTER — Other Ambulatory Visit: Payer: Self-pay

## 2021-04-27 ENCOUNTER — Ambulatory Visit (HOSPITAL_COMMUNITY): Payer: Medicare Other | Admitting: Physical Therapy

## 2021-04-27 DIAGNOSIS — I890 Lymphedema, not elsewhere classified: Secondary | ICD-10-CM
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
## 2021-04-30 ENCOUNTER — Other Ambulatory Visit: Payer: Self-pay

## 2021-04-30 ENCOUNTER — Ambulatory Visit (HOSPITAL_COMMUNITY): Payer: Medicare Other | Admitting: Physical Therapy

## 2021-04-30 DIAGNOSIS — I890 Lymphedema, not elsewhere classified: Secondary | ICD-10-CM | POA: Diagnosis not present

## 2021-04-30 NOTE — Therapy (Signed)
[facility] [address] [place], [place], [address] Phone: [phone]   Fax:  [phone]  Physical Therapy Treatment  Patient Details  Name: Sheila Jordan MRN: [id number] Date of Birth: 1927/10/12 Referring Provider (PT): Albertha Ghee   Encounter Date: 04/30/2021   PT End of Session - 04/30/21 1419     Visit Number 4    Number of Visits 12    Date for PT Re-Evaluation 05/23/21    Authorization Type [employer] medicare    Progress Note Due on Visit 10    PT Start Time 1320    PT Stop Time 1400    PT Time Calculation (min) 40 min    Activity Tolerance Patient tolerated treatment well    Behavior During Therapy [facility] for tasks assessed/performed             Past Medical History:  Diagnosis Date   Arthritis    Hypertension     Past Surgical History:  Procedure Laterality Date   REVERSE SHOULDER ARTHROPLASTY Right 06/19/2020   Procedure: REVERSE TOTAL SHOULDER ARTHROPLASTY;  Surgeon: Bjorn Pippin, MD;  Location: [facility] OR;  Service: Orthopedics;  Laterality: Right;    There were no vitals filed for this visit.   Subjective Assessment - 04/30/21 1417     Subjective pt states her leg is much better; waiting on her compression stockings.  STates she has not received a pump and hopes to get one soon.  Pt also reports some edema in her Lt LE today as well but has been standing more today.                               [facility] Adult PT Treatment/Exercise - 04/30/21 0001       Manual Therapy   Manual Therapy Manual Lymphatic Drainage (MLD);Compression Bandaging    Manual therapy comments Manual complete separate than rest of tx    Manual Lymphatic Drainage (MLD) Manual lymphatic decongestive techniques for inguinal to axillary anastomis    Compression Bandaging Multilayer short stretch bandage with 1/2in foam to knee wiht additional toe wrap                      PT Short Term Goals - 04/23/21 3825        PT SHORT TERM GOAL #1   Title PT to verbalize that she understands that lymphedema can not be cured and that she will need to pump and wear compression following discharge.    Time 1    Period Weeks    Status New    Target Date 04/30/21               PT Long Term Goals - 04/23/21 0943       PT LONG TERM GOAL #1   Title Pt measurements to be within 1 cm of left LE for improved comfort and improved skin integrity and prevent cellulitis as well as allow pt to fit into shoes    Time 4    Period Weeks    Status New    Target Date 05/21/21      PT LONG TERM GOAL #2   Title Pt to have obtained new compression garments 20-30 mm hg    Time 4    Period Weeks      PT LONG TERM GOAL #3   Title PT to have obtained and used a compression  pump.    Time 4    Period Weeks    Status New                   Plan - 04/30/21 1420     Clinical Impression Statement Biltateral LE's appear equal in congestion today with Rt down and Lt slightly up.  Pt has ordered her compression stockings and is curious today about when she will get her pump.  Very little induration in Rt LE today.  Manual for bil LE's today and compression bandaging to Rt LE only.    Personal Factors and Comorbidities Age    Examination-Activity Limitations Dressing    Stability/Clinical Decision Making Stable/Uncomplicated    Rehab Potential Good    PT Frequency 3x / week    PT Duration 4 weeks    PT Treatment/Interventions Manual techniques;Manual lymph drainage    PT Next Visit Plan total decongestive techniques and short stretch bandages with 1/2 in foam.  F/U with pump and compression garments. Measure on Wednesdays.    PT Home Exercise Plan PT very active both therapist and pt agree she does not need to be doing the exercises.    Consulted and Agree with Plan of Care Patient             Patient will benefit from skilled therapeutic intervention in order to improve the following deficits and  impairments:  Impaired sensation, Decreased skin integrity, Increased edema  Visit Diagnosis: Lymphedema, not elsewhere classified     Problem List Patient Active Problem List   Diagnosis Date Noted   Pelvic fracture (HCC) 06/16/2020   HTN (hypertension) 06/15/2020   Inferior pubic ramus fracture, right, closed, initial encounter (HCC) 06/15/2020   Sacral fracture (HCC) 06/15/2020   Humeral head fracture, right, closed, initial encounter 06/15/2020   Lurena Nida, PTA/CLT [phone]  Lurena Nida 04/30/2021, 2:25 PM  [facility] [address] [place], [place], [address] Phone: [phone]   Fax:  [phone]  Name: Sheila Jordan MRN: [id number] Date of Birth: August 24, 1928

## 2021-05-02 ENCOUNTER — Ambulatory Visit (HOSPITAL_COMMUNITY): Payer: Medicare Other | Admitting: Physical Therapy

## 2021-05-02 ENCOUNTER — Other Ambulatory Visit: Payer: Self-pay

## 2021-05-02 DIAGNOSIS — I890 Lymphedema, not elsewhere classified: Secondary | ICD-10-CM | POA: Diagnosis not present

## 2021-05-02 NOTE — Therapy (Signed)
[facility] [address] [place], [place], [address] Phone: [phone]   Fax:  [phone]  Physical Therapy Treatment  Patient Details  Name: Sheila Jordan MRN: [id number] Date of Birth: May 31, 1928 Referring Provider (PT): Sheila Jordan   Encounter Date: 05/02/2021   PT End of Session - 05/02/21 1715     Visit Number 5    Number of Visits 12    Date for PT Re-Evaluation 05/23/21    Authorization Type [employer]HC medicare    Progress Note Due on Visit 10    PT Start Time 1402    PT Stop Time 1446    PT Time Calculation (min) 44 min    Activity Tolerance Patient tolerated treatment well    Behavior During Therapy Sheila Jordan for tasks assessed/performed             Past Medical History:  Diagnosis Date   Arthritis    Hypertension     Past Surgical History:  Procedure Laterality Date   REVERSE SHOULDER ARTHROPLASTY Right 06/19/2020   Procedure: REVERSE TOTAL SHOULDER ARTHROPLASTY;  Surgeon: Sheila Pippin, MD;  Location: [facility] OR;  Service: Orthopedics;  Laterality: Right;    There were no vitals filed for this visit.   Subjective Assessment - 05/02/21 1701     Subjective pt states she got her stockings but she forgot to bring them today.  STates her legs are feeling much better and she loves the pump she got yesterday.    Currently in Pain? No/denies                   LYMPHEDEMA/ONCOLOGY QUESTIONNAIRE - 05/02/21 0001       Right Lower Extremity Lymphedema   20 cm Proximal to Suprapatella 54 cm   was 54.7   10 cm Proximal to Suprapatella 50 cm   was 50.3   At Midpatella/Popliteal Crease 48 cm   was 47.5   30 cm Proximal to Floor at Lateral Plantar Foot 41 cm   was 45.3   20 cm Proximal to Floor at Lateral Plantar Foot 31 1   was 33.3   10 cm Proximal to Floor at Lateral Malleoli 25 cm   was 29.2   Circumference of ankle/heel 34 cm.   was 34.3   5 cm Proximal to 1st MTP Joint 25 cm   was 25.5   Across MTP Joint 24 cm    was 25.4     Left Lower Extremity Lymphedema   20 cm Proximal to Suprapatella 54 cm   was 54.8   10 cm Proximal to Suprapatella 50 cm   was 50.7   At Midpatella/Popliteal Crease 48 cm   was 47   30 cm Proximal to Floor at Lateral Plantar Foot 42 cm   was 43.3   20 cm Proximal to Floor at Lateral Plantar Foot 31 cm   was 30.7   10 cm Proximal to Floor at Lateral Malleoli 26 cm   was 27   Circumference of ankle/heel 34 cm.   was 34   5 cm Proximal to 1st MTP Joint 23 cm   was 23.3   Across MTP Joint 23.4 cm   was 23.8                       [facility] Adult PT Treatment/Exercise - 05/02/21 0001       Manual Therapy   Manual Therapy Manual Lymphatic Drainage (MLD);Compression Bandaging  Manual therapy comments Manual complete separate from all other skilled interventions    Manual Lymphatic Drainage (MLD) Manual lymphatic decongestive techniques for inguinal to axillary anastomis    Compression Bandaging 1/2" foam and Multilayer short stretch bandage with to knee level Rt LE only                      PT Short Term Goals - 04/23/21 7169       PT SHORT TERM GOAL #1   Title PT to verbalize that she understands that lymphedema can not be cured and that she will need to pump and wear compression following discharge.    Time 1    Period Weeks    Status New    Target Date 04/30/21               PT Long Term Goals - 04/23/21 0943       PT LONG TERM GOAL #1   Title Pt measurements to be within 1 cm of left LE for improved comfort and improved skin integrity and prevent cellulitis as well as allow pt to fit into shoes    Time 4    Period Weeks    Status New    Target Date 05/21/21      PT LONG TERM GOAL #2   Title Pt to have obtained new compression garments 20-30 mm hg    Time 4    Period Weeks      PT LONG TERM GOAL #3   Title PT to have obtained and used a compression pump.    Time 4    Period Weeks    Status New                    Plan - 05/02/21 1712     Clinical Impression Statement Bilateral LE's remeasured today with noted reduction in both legs and are equal in volumes.  Pt did receive her compression stockings today and her pump.  She will bring her stockings next visit to ensure she is able to don/doff these independently.  Manual completed this session followed by moisturizing and rebandaging.    Personal Factors and Comorbidities Age    Examination-Activity Limitations Dressing    Stability/Clinical Decision Making Stable/Uncomplicated    Rehab Potential Good    PT Frequency 3x / week    PT Duration 4 weeks    PT Treatment/Interventions Manual techniques;Manual lymph drainage    PT Next Visit Plan Discharge next visit if able to don/doff garments.    PT Home Exercise Plan PT very active both therapist and pt agree she does not need to be doing the exercises.    Consulted and Agree with Plan of Care Patient             Patient will benefit from skilled therapeutic intervention in order to improve the following deficits and impairments:  Impaired sensation, Decreased skin integrity, Increased edema  Visit Diagnosis: Lymphedema, not elsewhere classified     Problem List Patient Active Problem List   Diagnosis Date Noted   Pelvic fracture (HCC) 06/16/2020   HTN (hypertension) 06/15/2020   Inferior pubic ramus fracture, right, closed, initial encounter (HCC) 06/15/2020   Sacral fracture (HCC) 06/15/2020   Humeral head fracture, right, closed, initial encounter 06/15/2020   Sheila Jordan, PTA/CLT [phone]  Sheila Jordan 05/02/2021, 5:16 PM  [facility] [address] [place], [place], [address] Phone: [phone]  Fax:  [phone]  Name: Sheila [facility] MRN: [id number] Date of Birth: 1928/01/26

## 2021-05-04 ENCOUNTER — Ambulatory Visit (HOSPITAL_COMMUNITY): Payer: Medicare Other | Admitting: Physical Therapy

## 2021-05-04 ENCOUNTER — Other Ambulatory Visit: Payer: Self-pay

## 2021-05-04 DIAGNOSIS — I890 Lymphedema, not elsewhere classified: Secondary | ICD-10-CM

## 2021-05-04 NOTE — Therapy (Addendum)
[place], [place], [address] Phone: [phone]   Fax:  [phone]  Physical Therapy Treatment  Patient Details  Name: Sheila [facility] MRN: [id number] Date of Birth: 1928-04-03 Referring Provider (PT): Wandra Feinstein  PHYSICAL THERAPY DISCHARGE SUMMARY  Visits from Start of Care: 5  Current functional level related to goals / functional outcomes: Pt has med all goals edema is down   Remaining deficits: none   Education / Equipment: Wearing compression and using pump    Patient agrees to discharge. Patient goals were met. Patient is being discharged due to meeting the stated rehab goals.  Encounter Date: 05/04/2021   PT End of Session - 05/04/21 0838     Visit Number 5    Number of Visits 5   Date for PT Re-Evaluation 05/23/21    Authorization Type [employer] medicare    Progress Note Due on Visit 10    PT Start Time 0820    PT Stop Time 0830    PT Time Calculation (min) 10 min    Activity Tolerance Patient tolerated treatment well    Behavior During Therapy WFL for tasks assessed/performed             Past Medical History:  Diagnosis Date   Arthritis    Hypertension     Past Surgical History:  Procedure Laterality Date   REVERSE SHOULDER ARTHROPLASTY Right 06/19/2020   Procedure: REVERSE TOTAL SHOULDER ARTHROPLASTY;  Surgeon: Hiram Gash, MD;  Location: [place];  Service: Orthopedics;  Laterality: Right;    There were no vitals filed for this visit.   Subjective Assessment - 05/04/21 0837     Subjective Pt comes today with compression stockings and states her legs feel great!    Currently in Pain? No/denies               Below measurements obtained on 05/02/21    LYMPHEDEMA/ONCOLOGY QUESTIONNAIRE - 05/04/21 0840       Right Lower Extremity Lymphedema   20 cm Proximal to Suprapatella 54 cm   was 54.7   10 cm Proximal to Suprapatella 50 cm   was 50.3   At Midpatella/Popliteal  Crease 48 cm   was 47.5   30 cm Proximal to Floor at Lateral Plantar Foot 41 cm   was 45.3   20 cm Proximal to Floor at Lateral Plantar Foot 31 1   was 33.3   10 cm Proximal to Floor at Lateral Malleoli 25 cm   was 29.2   Circumference of ankle/heel 34 cm.   was 34.3   5 cm Proximal to 1st MTP Joint 25 cm   was 25.5   Across MTP Joint 24 cm   was 25.4     Left Lower Extremity Lymphedema   20 cm Proximal to Suprapatella 54 cm   was 54.8   10 cm Proximal to Suprapatella 50 cm   was 50.7   At Midpatella/Popliteal Crease 48 cm   was 47   30 cm Proximal to Floor at Lateral Plantar Foot 42 cm   was 43.3   20 cm Proximal to Floor at Lateral Plantar Foot 31 cm   was 30.7   10 cm Proximal to Floor at Lateral Malleoli 26 cm   was 27   Circumference of ankle/heel 34 cm.   was 34   5 cm Proximal to 1st MTP Joint 23 cm   was 23.3   Across MTP Joint 23.4  cm   was 23.8                                 PT Short Term Goals - 05/04/21 2694       PT SHORT TERM GOAL #1   Title PT to verbalize that she understands that lymphedema can not be cured and that she will need to pump and wear compression following discharge.    Time 1    Period Weeks    Status Achieved    Target Date 04/30/21               PT Long Term Goals - 05/04/21 0842       PT LONG TERM GOAL #1   Title Pt measurements to be within 1 cm of left LE for improved comfort and improved skin integrity and prevent cellulitis as well as allow pt to fit into shoes    Time 4    Period Weeks    Status Achieved      PT LONG TERM GOAL #2   Title Pt to have obtained new compression garments 20-30 mm hg    Time 4    Period Weeks    Status Achieved      PT LONG TERM GOAL #3   Title PT to have obtained and used a compression pump.    Time 4    Period Weeks    Status Achieved                   Plan - 05/04/21 0840     Clinical Impression Statement Pt comes today with stockings and observed  don/doff without issues.  Pt was measured last session with equal volumes in bilateral LE's.  Pt is using her pump daily and is independed with stockings.  Pt is aware to purchase new ones every 6 months.  Pt has met all goals and is ready for discharge.    Personal Factors and Comorbidities Age    Examination-Activity Limitations Dressing    Stability/Clinical Decision Making Stable/Uncomplicated    Rehab Potential Good    PT Frequency 3x / week    PT Duration 4 weeks    PT Treatment/Interventions Manual techniques;Manual lymph drainage    PT Next Visit Plan Discharge to self care.    PT Home Exercise Plan PT very active both therapist and pt agree she does not need to be doing the exercises.    Consulted and Agree with Plan of Care Patient             Patient will benefit from skilled therapeutic intervention in order to improve the following deficits and impairments:  Impaired sensation, Decreased skin integrity, Increased edema  Visit Diagnosis: Lymphedema, not elsewhere classified     Problem List Patient Active Problem List   Diagnosis Date Noted   Pelvic fracture ([place]) 06/16/2020   HTN (hypertension) 06/15/2020   Inferior pubic ramus fracture, right, closed, initial encounter ([place]) 06/15/2020   Sacral fracture ([place]) 06/15/2020   Humeral head fracture, right, closed, initial encounter 06/15/2020   Teena Irani, PTA/CLT [place], PT CLT [phone] 05/04/2021, 8:42 AM  [place], [place], [address] Phone: [phone]   Fax:  [phone]  Name: Sheila [facility] MRN: [id number] Date of Birth: December 25, 1927

## 2021-05-07 ENCOUNTER — Ambulatory Visit (HOSPITAL_COMMUNITY): Payer: Medicare Other | Admitting: Physical Therapy

## 2021-05-09 ENCOUNTER — Ambulatory Visit (HOSPITAL_COMMUNITY): Payer: Medicare Other | Admitting: Physical Therapy

## 2021-05-11 ENCOUNTER — Ambulatory Visit (HOSPITAL_COMMUNITY): Payer: Medicare Other | Admitting: Physical Therapy

## 2021-05-14 ENCOUNTER — Ambulatory Visit (HOSPITAL_COMMUNITY): Payer: Medicare Other | Admitting: Physical Therapy

## 2021-05-16 ENCOUNTER — Encounter (HOSPITAL_COMMUNITY): Payer: Medicare Other

## 2021-05-18 ENCOUNTER — Encounter (HOSPITAL_COMMUNITY): Payer: Medicare Other | Admitting: Physical Therapy

## 2021-05-21 ENCOUNTER — Encounter (HOSPITAL_COMMUNITY): Payer: Medicare Other | Admitting: Physical Therapy

## 2021-05-23 ENCOUNTER — Encounter (HOSPITAL_COMMUNITY): Payer: Medicare Other

## 2021-05-25 ENCOUNTER — Encounter (HOSPITAL_COMMUNITY): Payer: Medicare Other | Admitting: Physical Therapy

## 2021-05-28 ENCOUNTER — Encounter (HOSPITAL_COMMUNITY): Payer: Medicare Other | Admitting: Physical Therapy

## 2021-05-30 ENCOUNTER — Encounter (HOSPITAL_COMMUNITY): Payer: Medicare Other

## 2021-06-01 ENCOUNTER — Encounter (HOSPITAL_COMMUNITY): Payer: Medicare Other | Admitting: Physical Therapy

## 2021-06-02 DIAGNOSIS — I890 Lymphedema, not elsewhere classified: Secondary | ICD-10-CM | POA: Diagnosis not present

## 2021-06-04 ENCOUNTER — Encounter (HOSPITAL_COMMUNITY): Payer: Medicare Other | Admitting: Physical Therapy

## 2021-06-06 ENCOUNTER — Encounter (HOSPITAL_COMMUNITY): Payer: Medicare Other | Admitting: Physical Therapy

## 2021-06-08 ENCOUNTER — Encounter (HOSPITAL_COMMUNITY): Payer: Medicare Other | Admitting: Physical Therapy

## 2021-06-27 DIAGNOSIS — R600 Localized edema: Secondary | ICD-10-CM | POA: Diagnosis not present

## 2021-06-27 DIAGNOSIS — I10 Essential (primary) hypertension: Secondary | ICD-10-CM | POA: Diagnosis not present

## 2021-06-27 DIAGNOSIS — Z79899 Other long term (current) drug therapy: Secondary | ICD-10-CM | POA: Diagnosis not present

## 2021-07-04 DIAGNOSIS — Z23 Encounter for immunization: Secondary | ICD-10-CM | POA: Diagnosis not present

## 2021-07-04 DIAGNOSIS — N1831 Chronic kidney disease, stage 3a: Secondary | ICD-10-CM | POA: Diagnosis not present

## 2021-07-04 DIAGNOSIS — I890 Lymphedema, not elsewhere classified: Secondary | ICD-10-CM | POA: Diagnosis not present

## 2021-07-17 IMAGING — DX DG SHOULDER 2+V PORT*R*
1 series · 1 of 1 positions shown · non-contrast
Comparison: 06/15/2020

CLINICAL DATA: Postop

EXAM:
PORTABLE RIGHT SHOULDER

[shoulder ap]
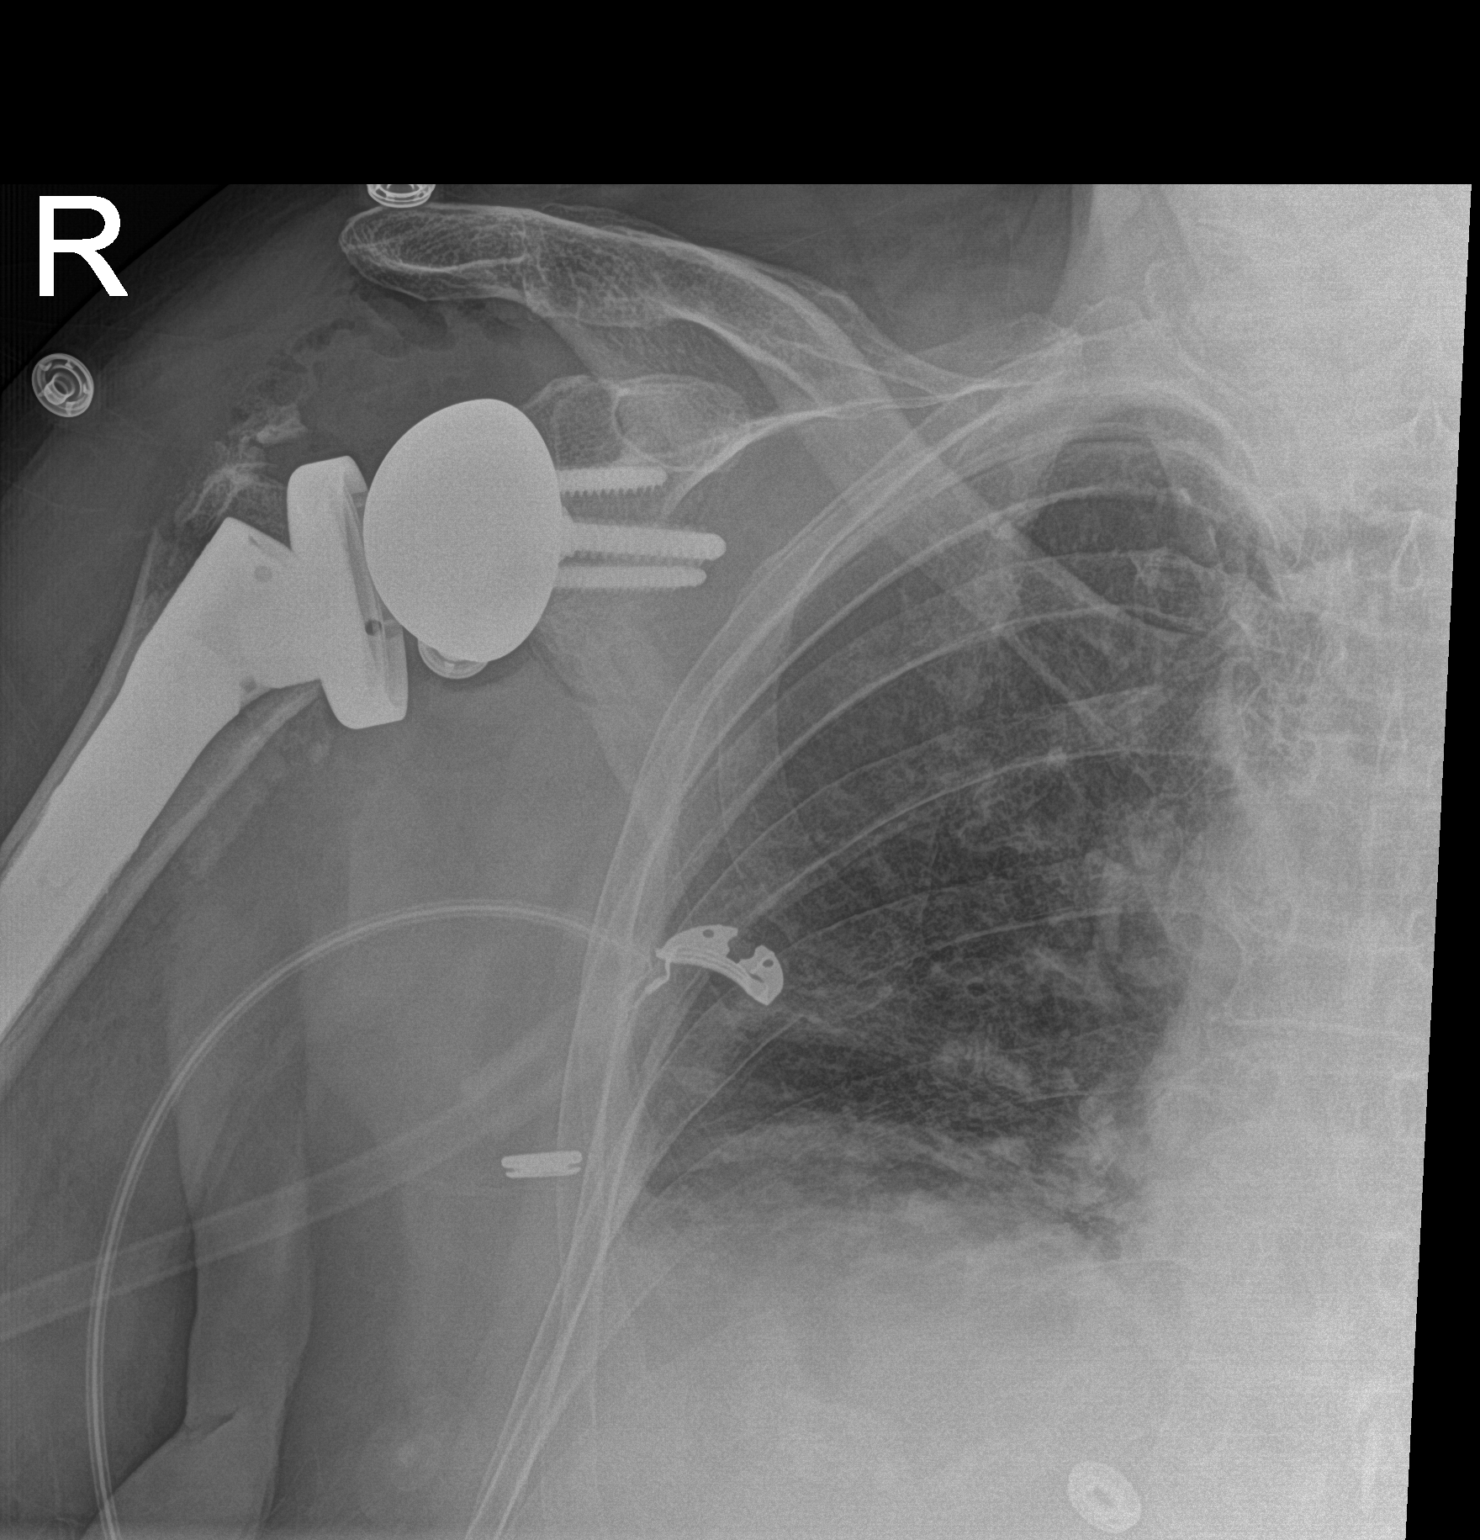

[1 of 1 positions shown; findings below may reference images not displayed]

FINDINGS: Interval reverse right shoulder replacement with intact hardware and
normal alignment. Gas within the soft tissues consistent with recent
surgery. Small osseous fragment cranial to the proximal humeral
shaft.
IMPRESSION: Interval reverse right shoulder replacement with expected
postsurgical changes.

## 2021-08-14 DIAGNOSIS — I10 Essential (primary) hypertension: Secondary | ICD-10-CM | POA: Diagnosis not present

## 2021-09-25 DIAGNOSIS — R5383 Other fatigue: Secondary | ICD-10-CM | POA: Diagnosis not present

## 2021-09-25 DIAGNOSIS — R609 Edema, unspecified: Secondary | ICD-10-CM | POA: Diagnosis not present

## 2022-01-24 DIAGNOSIS — R609 Edema, unspecified: Secondary | ICD-10-CM | POA: Diagnosis not present

## 2022-01-24 DIAGNOSIS — J309 Allergic rhinitis, unspecified: Secondary | ICD-10-CM | POA: Diagnosis not present

## 2022-03-29 ENCOUNTER — Ambulatory Visit
Admission: EM | Admit: 2022-03-29 | Discharge: 2022-03-29 | Disposition: A | Discharge status: Home / Self Care | Payer: Medicare Other | Attending: Nurse Practitioner | Admitting: Nurse Practitioner

## 2022-03-29 DIAGNOSIS — N3000 Acute cystitis without hematuria: Secondary | ICD-10-CM | POA: Diagnosis not present

## 2022-03-29 LAB — POCT URINALYSIS DIP (MANUAL ENTRY)
Bilirubin, UA: NEGATIVE
Blood, UA: NEGATIVE
Glucose, UA: NEGATIVE mg/dL
Ketones, POC UA: NEGATIVE mg/dL
Nitrite, UA: POSITIVE — AB
Spec Grav, UA: 1.02 (ref 1.010–1.025)
Urobilinogen, UA: 1 E.U./dL
pH, UA: 7 (ref 5.0–8.0)

## 2022-03-29 MED ORDER — AMOXICILLIN-POT CLAVULANATE 875-125 MG PO TABS: 1.0000 | ORAL_TABLET | Freq: Two times a day (BID) | ORAL | 0 refills | Status: DC

## 2022-04-01 ENCOUNTER — Telehealth (HOSPITAL_COMMUNITY): Payer: Self-pay | Admitting: Emergency Medicine

## 2022-04-01 LAB — URINE CULTURE: Culture: >=100000 — AB

## 2022-04-01 MED ORDER — CEPHALEXIN 500 MG PO CAPS: 500.0000 mg | ORAL_CAPSULE | Freq: Two times a day (BID) | ORAL | 0 refills | Status: AC

## 2022-04-30 DIAGNOSIS — B351 Tinea unguium: Secondary | ICD-10-CM | POA: Diagnosis not present

## 2022-05-07 DEATH — deceased

## 2022-05-23 DIAGNOSIS — R601 Generalized edema: Secondary | ICD-10-CM | POA: Diagnosis not present

## 2022-05-23 DIAGNOSIS — I10 Essential (primary) hypertension: Secondary | ICD-10-CM | POA: Diagnosis not present

## 2022-05-23 DIAGNOSIS — N1832 Chronic kidney disease, stage 3b: Secondary | ICD-10-CM | POA: Diagnosis not present

## 2022-05-27 DIAGNOSIS — N1831 Chronic kidney disease, stage 3a: Secondary | ICD-10-CM | POA: Diagnosis not present

## 2022-05-27 DIAGNOSIS — I10 Essential (primary) hypertension: Secondary | ICD-10-CM | POA: Diagnosis not present

## 2022-08-21 ENCOUNTER — Ambulatory Visit (INDEPENDENT_AMBULATORY_CARE_PROVIDER_SITE_OTHER): Payer: Medicare Other

## 2022-08-21 ENCOUNTER — Other Ambulatory Visit: Payer: Self-pay

## 2022-08-21 ENCOUNTER — Ambulatory Visit
Admission: RE | Admit: 2022-08-21 | Discharge: 2022-08-21 | Disposition: A | Discharge status: Home / Self Care | Payer: Medicare Other | Source: Ambulatory Visit | Attending: Nurse Practitioner | Admitting: Nurse Practitioner

## 2022-08-21 VITALS — BP 147/81 | HR 84 | Temp 97.5°F | Resp 16

## 2022-08-21 DIAGNOSIS — R1013 Epigastric pain: Secondary | ICD-10-CM

## 2022-08-21 DIAGNOSIS — R1030 Lower abdominal pain, unspecified: Secondary | ICD-10-CM | POA: Diagnosis not present

## 2022-08-21 LAB — POCT URINALYSIS DIP (MANUAL ENTRY)
Blood, UA: NEGATIVE
Glucose, UA: NEGATIVE mg/dL
Leukocytes, UA: NEGATIVE
Nitrite, UA: NEGATIVE
Protein Ur, POC: 30 mg/dL — AB
Spec Grav, UA: >=1.03 — AB (ref 1.010–1.025)
Urobilinogen, UA: 0.2 E.U./dL
pH, UA: 5 (ref 5.0–8.0)

## 2022-08-21 MED ORDER — OMEPRAZOLE 20 MG PO CPDR: 20.0000 mg | DELAYED_RELEASE_CAPSULE | Freq: Every day | ORAL | 0 refills | Status: DC

## 2022-08-21 MED ORDER — ALUM & MAG HYDROXIDE-SIMETH 200-200-20 MG/5ML PO SUSP
30.0000 mL | Freq: Once | ORAL | Status: AC
  Administered 2022-08-21: 30 mL via ORAL

## 2022-08-21 MED ORDER — MYLANTA MAXIMUM STRENGTH 400-400-40 MG/5ML PO SUSP: 15.0000 mL | Freq: Four times a day (QID) | ORAL | 0 refills | Status: DC | PRN

## 2022-08-21 MED ORDER — SIMETHICONE 80 MG PO CHEW: 80.0000 mg | CHEWABLE_TABLET | Freq: Four times a day (QID) | ORAL | 0 refills | Status: DC | PRN

## 2022-08-21 NOTE — ED Triage Notes (Signed)
Pt states epigastric pain for the past 2 days. States it is worse at night. Denies any N/V/D.

## 2022-08-21 NOTE — Discharge Instructions (Addendum)
The urinalysis does not show that you have a urinary tract infection.  Also the x-ray of your abdomen and EKG do not show any acute findings. Take medication as prescribed. Increase fluids and allow for plenty of rest. Make sure you are eating and drinking appropriately.  Try to drink at least 5-6 8 ounce glasses of water daily. Recommend a bland diet to include fruits, vegetables, yogurt, puddings, or Jell-O until your symptoms improve. As discussed, if you develop worsening abdominal pain with nausea, vomiting, diarrhea, fever, chills, or other concerns, please go to the emergency department immediately.  If you do not notice any improvement of your symptoms over the next 12 to 24 hours, please go to the emergency department. Follow-up as needed.

## 2022-08-21 NOTE — ED Provider Notes (Signed)
[facility]    CSN: [id number] Arrival date & time: 08/21/22  1122      History   Chief Complaint Chief Complaint  Patient presents with   Abdominal Pain    HPI Sheila Jordan is a 88 y.o. female.   The history is provided by the patient.   Patient presents for complaints of epigastric pain that has been present for the last 2 days.  Patient denies fever, chills, chest pain, shortness of breath, or difficulty breathing, or urinary symptoms.  She states that the pain became intense last evening.  States that she took Tylenol, which minimally relieved her symptoms.  She also complains of gas that has been present since her symptoms started.  Patient has a history of reflux, she takes omeprazole daily currently. Past Medical History:  Diagnosis Date   Arthritis    Hypertension     Patient Active Problem List   Diagnosis Date Noted   Pelvic fracture (HCC) 06/16/2020   HTN (hypertension) 06/15/2020   Inferior pubic ramus fracture, right, closed, initial encounter (HCC) 06/15/2020   Sacral fracture (HCC) 06/15/2020   Humeral head fracture, right, closed, initial encounter 06/15/2020    Past Surgical History:  Procedure Laterality Date   APPENDECTOMY     CHOLECYSTECTOMY     REVERSE SHOULDER ARTHROPLASTY Right 06/19/2020   Procedure: REVERSE TOTAL SHOULDER ARTHROPLASTY;  Surgeon: Bjorn Pippin, MD;  Location: [facility] OR;  Service: Orthopedics;  Laterality: Right;    OB History     Gravida  3   Para  3   Term  2   Preterm  1   AB      Living  1      SAB      IAB      Ectopic      Multiple  1   Live Births  2            Home Medications    Prior to Admission medications   Medication Sig Start Date End Date Taking? Authorizing Provider  alum & mag hydroxide-simeth (MYLANTA MAXIMUM STRENGTH) 400-400-40 MG/5ML suspension Take 15 mLs by mouth every 6 (six) hours as needed for indigestion. 08/21/22  Yes Dayanira Giovannetti-Warren, Sadie Haber, NP   omeprazole (PRILOSEC) 20 MG capsule Take 1 capsule (20 mg total) by mouth daily. 08/21/22  Yes Laurel Harnden-Warren, Sadie Haber, NP  ferrous sulfate 325 (65 FE) MG EC tablet Take 1 tablet (325 mg total) by mouth 2 (two) times daily. 06/21/20 08/20/20  Lanae Boast, MD  nystatin-triamcinolone ointment (MYCOLOG) Apply 1 application topically 2 (two) times daily. To affected area. 08/02/19   Tilda Burrow, MD  Vitamin D, Ergocalciferol, (DRISDOL) 1.25 MG (50000 UNIT) CAPS capsule Take 50,000 Units by mouth once a week. 05/19/20   [provider]    Family History Family History  Problem Relation Age of Onset   Other Father        heart condition   Parkinson's disease Mother    Breast cancer Daughter     Social History Social History   Tobacco Use   Smoking status: Never   Smokeless tobacco: Never  Vaping Use   Vaping Use: Never used  Substance Use Topics   Alcohol use: No   Drug use: No     Allergies   Patient has no known allergies.   Review of Systems Review of Systems Per HPI  Physical Exam Triage Vital Signs ED Triage Vitals  Enc Vitals Group  BP 08/21/22 1154 (!) 147/81     Pulse Rate 08/21/22 1154 84     Resp 08/21/22 1154 16     Temp 08/21/22 1154 (!) 97.5 F (36.4 C)     Temp Source 08/21/22 1154 Oral     SpO2 08/21/22 1154 95 %     Weight --      Height --      Head Circumference --      Peak Flow --      Pain Score 08/21/22 1155 7     Pain Loc --      Pain Edu? --      Excl. in GC? --    No data found.  Updated Vital Signs BP (!) 147/81 (BP Location: Right Arm)   Pulse 84   Temp (!) 97.5 F (36.4 C) (Oral)   Resp 16   SpO2 95%   Visual Acuity Right Eye Distance:   Left Eye Distance:   Bilateral Distance:    Right Eye Near:   Left Eye Near:    Bilateral Near:     Physical Exam Vitals and nursing note reviewed.  Constitutional:      General: She is not in acute distress.    Appearance: She is well-developed.  HENT:     Head:  Normocephalic.  Cardiovascular:     Rate and Rhythm: Normal rate and regular rhythm.     Heart sounds: Normal heart sounds.  Pulmonary:     Effort: Pulmonary effort is normal.     Breath sounds: Normal breath sounds.  Abdominal:     General: Bowel sounds are normal. There is no distension.     Palpations: Abdomen is soft.     Tenderness: There is abdominal tenderness in the epigastric area.     Comments: For some relief after receiving Mylanta.  Skin:    General: Skin is warm and dry.  Neurological:     Mental Status: She is alert.      UC Treatments / Results  Labs (all labs ordered are listed, but only abnormal results are displayed) Labs Reviewed  POCT URINALYSIS DIP (MANUAL ENTRY) - Abnormal; Notable for the following components:      Result Value   Clarity, UA cloudy (*)    Bilirubin, UA small (*)    Ketones, POC UA trace (5) (*)    Spec Grav, UA >=1.030 (*)    Protein Ur, POC =30 (*)    All other components within normal limits    EKG: NSR, no STEMI   Radiology DG Abd 1 View  Result Date: 08/21/2022 CLINICAL DATA:  Lower abdominal pain EXAM: ABDOMEN - 1 VIEW COMPARISON:  None Available. FINDINGS: Right upper quadrant surgical clips. Nonobstructive bowel gas pattern. No free air or pneumatosis. No abnormal radio-opaque calculi or mass effect. Sequela of prior right pubic ramus fractures. IMPRESSION: 1. Nonobstructive bowel gas pattern. 2. Sequela of prior right pubic ramus fractures. Electronically Signed   By: Agustin Cree M.D.   On: 08/21/2022 12:27    Procedures Procedures (including critical care time)  Medications Ordered in UC Medications  alum & mag hydroxide-simeth (MAALOX/MYLANTA) 200-200-20 MG/5ML suspension 30 mL (30 mLs Oral Given 08/21/22 1236)    Initial Impression / Assessment and Plan / UC Course  I have reviewed the triage vital signs and the nursing notes.  Pertinent labs & imaging results that were available during my care of the patient were  reviewed by me and considered in my medical  decision making (see chart for details).  Patient presents for epigastric pain that started over the past 1 to 2 days.  On exam, patient is well-appearing, she is in no acute distress, she is mildly hypertensive, but otherwise stable.  She has mild tenderness to the epigastric region.  X-ray is negative for any acute finding, EKG is also normal.  Her urinalysis signs of a urinary tract infection.  No concern for acute abdomen at this time.  Epigastric pain may be related to reflux.  Mylanta was given in the clinic with some relief.  Epigastric pain  Omeprazole 20 mg, and Mylanta 400/400/40 mg was prescribed for abdominal pain. Tylenol for abdominal pain or discomfort. Recommend a bland diet until symptoms improve, this includes fruits, vegetables, yogurt, puddings, or Jell-O.  Increase water and allow for plenty of rest for hydration. Strict ER precautions were provided to the patient.  Patient advised to follow-up with her PCP if symptoms fail to improve..   Final Clinical Impressions(s) / UC Diagnoses   Final diagnoses:  Epigastric pain     Discharge Instructions      The urinalysis does not show that you have a urinary tract infection.  Also the x-ray of your abdomen and EKG do not show any acute findings. Take medication as prescribed. Increase fluids and allow for plenty of rest. Make sure you are eating and drinking appropriately.  Try to drink at least 5-6 8 ounce glasses of water daily. Recommend a bland diet to include fruits, vegetables, yogurt, puddings, or Jell-O until your symptoms improve. As discussed, if you develop worsening abdominal pain with nausea, vomiting, diarrhea, fever, chills, or other concerns, please go to the emergency department immediately.  If you do not notice any improvement of your symptoms over the next 12 to 24 hours, please go to the emergency department. Follow-up as needed.     ED Prescriptions      Medication Sig Dispense Auth. Provider   omeprazole (PRILOSEC) 20 MG capsule Take 1 capsule (20 mg total) by mouth daily. 30 capsule Shemar Plemmons-Warren, Sadie Haber, NP   simethicone (GAS-X) 80 MG chewable tablet  (Status: Discontinued) Chew 1 tablet (80 mg total) by mouth every 6 (six) hours as needed for flatulence. 30 tablet Tauri Ethington-Warren, Sadie Haber, NP   alum & mag hydroxide-simeth (MYLANTA MAXIMUM STRENGTH) 400-400-40 MG/5ML suspension Take 15 mLs by mouth every 6 (six) hours as needed for indigestion. 355 mL Bracken Moffa-Warren, Sadie Haber, NP      PDMP not reviewed this encounter.   Abran Cantor, NP 08/21/22 1326

## 2022-08-26 ENCOUNTER — Emergency Department (HOSPITAL_COMMUNITY): Payer: Medicare Other

## 2022-08-26 ENCOUNTER — Other Ambulatory Visit: Payer: Self-pay

## 2022-08-26 ENCOUNTER — Other Ambulatory Visit: Payer: Self-pay | Admitting: Acute Care

## 2022-08-26 ENCOUNTER — Encounter (HOSPITAL_COMMUNITY)
Admission: EM | Disposition: A | Discharge status: Home Health | Payer: Self-pay | Source: Home / Self Care | Attending: Neurology

## 2022-08-26 ENCOUNTER — Inpatient Hospital Stay (HOSPITAL_COMMUNITY): Payer: Medicare Other

## 2022-08-26 ENCOUNTER — Emergency Department (HOSPITAL_COMMUNITY): Payer: Medicare Other | Admitting: Anesthesiology

## 2022-08-26 ENCOUNTER — Encounter (HOSPITAL_COMMUNITY): Payer: Self-pay

## 2022-08-26 ENCOUNTER — Inpatient Hospital Stay (HOSPITAL_COMMUNITY)
Admission: EM | Admit: 2022-08-26 | Discharge: 2022-08-29 | DRG: 023 | Disposition: A | Discharge status: Home Health | Payer: Medicare Other | Attending: Internal Medicine | Admitting: Internal Medicine

## 2022-08-26 ENCOUNTER — Encounter (HOSPITAL_COMMUNITY): Payer: Self-pay | Admitting: Emergency Medicine

## 2022-08-26 DIAGNOSIS — R0602 Shortness of breath: Secondary | ICD-10-CM | POA: Diagnosis not present

## 2022-08-26 DIAGNOSIS — I63311 Cerebral infarction due to thrombosis of right middle cerebral artery: Secondary | ICD-10-CM | POA: Diagnosis not present

## 2022-08-26 DIAGNOSIS — E785 Hyperlipidemia, unspecified: Secondary | ICD-10-CM | POA: Diagnosis not present

## 2022-08-26 DIAGNOSIS — I639 Cerebral infarction, unspecified: Secondary | ICD-10-CM | POA: Diagnosis present

## 2022-08-26 DIAGNOSIS — I4891 Unspecified atrial fibrillation: Secondary | ICD-10-CM

## 2022-08-26 DIAGNOSIS — Z803 Family history of malignant neoplasm of breast: Secondary | ICD-10-CM

## 2022-08-26 DIAGNOSIS — Z1152 Encounter for screening for COVID-19: Secondary | ICD-10-CM | POA: Diagnosis not present

## 2022-08-26 DIAGNOSIS — D6869 Other thrombophilia: Secondary | ICD-10-CM | POA: Diagnosis present

## 2022-08-26 DIAGNOSIS — R270 Ataxia, unspecified: Secondary | ICD-10-CM | POA: Diagnosis present

## 2022-08-26 DIAGNOSIS — I129 Hypertensive chronic kidney disease with stage 1 through stage 4 chronic kidney disease, or unspecified chronic kidney disease: Secondary | ICD-10-CM | POA: Diagnosis not present

## 2022-08-26 DIAGNOSIS — Z820 Family history of epilepsy and other diseases of the nervous system: Secondary | ICD-10-CM | POA: Diagnosis not present

## 2022-08-26 DIAGNOSIS — J9601 Acute respiratory failure with hypoxia: Secondary | ICD-10-CM

## 2022-08-26 DIAGNOSIS — I6389 Other cerebral infarction: Secondary | ICD-10-CM

## 2022-08-26 DIAGNOSIS — R29706 NIHSS score 6: Secondary | ICD-10-CM | POA: Diagnosis not present

## 2022-08-26 DIAGNOSIS — J810 Acute pulmonary edema: Secondary | ICD-10-CM

## 2022-08-26 DIAGNOSIS — M21332 Wrist drop, left wrist: Secondary | ICD-10-CM | POA: Diagnosis not present

## 2022-08-26 DIAGNOSIS — Z79899 Other long term (current) drug therapy: Secondary | ICD-10-CM

## 2022-08-26 DIAGNOSIS — R6889 Other general symptoms and signs: Secondary | ICD-10-CM | POA: Diagnosis not present

## 2022-08-26 DIAGNOSIS — M1990 Unspecified osteoarthritis, unspecified site: Secondary | ICD-10-CM | POA: Diagnosis not present

## 2022-08-26 DIAGNOSIS — I63411 Cerebral infarction due to embolism of right middle cerebral artery: Secondary | ICD-10-CM | POA: Diagnosis not present

## 2022-08-26 DIAGNOSIS — R4781 Slurred speech: Secondary | ICD-10-CM | POA: Diagnosis not present

## 2022-08-26 DIAGNOSIS — R52 Pain, unspecified: Secondary | ICD-10-CM | POA: Diagnosis not present

## 2022-08-26 DIAGNOSIS — G8194 Hemiplegia, unspecified affecting left nondominant side: Secondary | ICD-10-CM | POA: Diagnosis present

## 2022-08-26 DIAGNOSIS — R471 Dysarthria and anarthria: Secondary | ICD-10-CM | POA: Diagnosis present

## 2022-08-26 DIAGNOSIS — I10 Essential (primary) hypertension: Secondary | ICD-10-CM

## 2022-08-26 DIAGNOSIS — R29810 Facial weakness: Secondary | ICD-10-CM | POA: Diagnosis present

## 2022-08-26 DIAGNOSIS — I480 Paroxysmal atrial fibrillation: Secondary | ICD-10-CM | POA: Diagnosis not present

## 2022-08-26 DIAGNOSIS — K219 Gastro-esophageal reflux disease without esophagitis: Secondary | ICD-10-CM | POA: Diagnosis present

## 2022-08-26 DIAGNOSIS — E1122 Type 2 diabetes mellitus with diabetic chronic kidney disease: Secondary | ICD-10-CM | POA: Diagnosis not present

## 2022-08-26 DIAGNOSIS — Z96611 Presence of right artificial shoulder joint: Secondary | ICD-10-CM | POA: Diagnosis present

## 2022-08-26 DIAGNOSIS — Z9049 Acquired absence of other specified parts of digestive tract: Secondary | ICD-10-CM

## 2022-08-26 DIAGNOSIS — I63511 Cerebral infarction due to unspecified occlusion or stenosis of right middle cerebral artery: Secondary | ICD-10-CM | POA: Diagnosis not present

## 2022-08-26 DIAGNOSIS — N183 Chronic kidney disease, stage 3 unspecified: Secondary | ICD-10-CM | POA: Diagnosis not present

## 2022-08-26 DIAGNOSIS — Z743 Need for continuous supervision: Secondary | ICD-10-CM | POA: Diagnosis not present

## 2022-08-26 DIAGNOSIS — R262 Difficulty in walking, not elsewhere classified: Secondary | ICD-10-CM | POA: Diagnosis not present

## 2022-08-26 DIAGNOSIS — R531 Weakness: Secondary | ICD-10-CM | POA: Diagnosis not present

## 2022-08-26 DIAGNOSIS — R414 Neurologic neglect syndrome: Secondary | ICD-10-CM | POA: Diagnosis not present

## 2022-08-26 DIAGNOSIS — J90 Pleural effusion, not elsewhere classified: Secondary | ICD-10-CM | POA: Diagnosis not present

## 2022-08-26 HISTORY — PX: IR PERCUTANEOUS ART THROMBECTOMY/INFUSION INTRACRANIAL INC DIAG ANGIO: IMG6087

## 2022-08-26 HISTORY — PX: RADIOLOGY WITH ANESTHESIA: SHX6223

## 2022-08-26 HISTORY — PX: IR CT HEAD LTD: IMG2386

## 2022-08-26 HISTORY — PX: IR US GUIDE VASC ACCESS RIGHT: IMG2390

## 2022-08-26 LAB — CBC
HCT: 37.1 % (ref 36.0–46.0)
Hemoglobin: 12.4 g/dL (ref 12.0–15.0)
MCH: 30 pg (ref 26.0–34.0)
MCHC: 33.4 g/dL (ref 30.0–36.0)
MCV: 89.8 fL (ref 80.0–100.0)
Platelets: 194 10*3/uL (ref 150–400)
RBC: 4.13 MIL/uL (ref 3.87–5.11)
RDW: 14 % (ref 11.5–15.5)
WBC: 6.9 10*3/uL (ref 4.0–10.5)
nRBC: 0 % (ref 0.0–0.2)

## 2022-08-26 LAB — DIFFERENTIAL
Abs Immature Granulocytes: 0.04 10*3/uL (ref 0.00–0.07)
Basophils Absolute: 0 10*3/uL (ref 0.0–0.1)
Basophils Relative: 0 %
Eosinophils Absolute: 0.1 10*3/uL (ref 0.0–0.5)
Eosinophils Relative: 1 %
Immature Granulocytes: 1 %
Lymphocytes Relative: 16 %
Lymphs Abs: 1.1 10*3/uL (ref 0.7–4.0)
Monocytes Absolute: 0.4 10*3/uL (ref 0.1–1.0)
Monocytes Relative: 6 %
Neutro Abs: 5.2 10*3/uL (ref 1.7–7.7)
Neutrophils Relative %: 76 %

## 2022-08-26 LAB — GLUCOSE, CAPILLARY
Glucose-Capillary: 117 mg/dL — ABNORMAL HIGH (ref 70–99)
Glucose-Capillary: 126 mg/dL — ABNORMAL HIGH (ref 70–99)
Glucose-Capillary: 132 mg/dL — ABNORMAL HIGH (ref 70–99)
Glucose-Capillary: 134 mg/dL — ABNORMAL HIGH (ref 70–99)

## 2022-08-26 LAB — POCT I-STAT 7, (LYTES, BLD GAS, ICA,H+H)
Acid-base deficit: 6 mmol/L — ABNORMAL HIGH (ref 0.0–2.0)
Bicarbonate: 18.7 mmol/L — ABNORMAL LOW (ref 20.0–28.0)
Calcium, Ion: 1.18 mmol/L (ref 1.15–1.40)
HCT: 33 % — ABNORMAL LOW (ref 36.0–46.0)
Hemoglobin: 11.2 g/dL — ABNORMAL LOW (ref 12.0–15.0)
O2 Saturation: 94 %
Potassium: 3.7 mmol/L (ref 3.5–5.1)
Sodium: 140 mmol/L (ref 135–145)
TCO2: 20 mmol/L — ABNORMAL LOW (ref 22–32)
pCO2 arterial: 31.9 mmHg — ABNORMAL LOW (ref 32–48)
pH, Arterial: 7.377 (ref 7.35–7.45)
pO2, Arterial: 71 mmHg — ABNORMAL LOW (ref 83–108)

## 2022-08-26 LAB — COMPREHENSIVE METABOLIC PANEL
ALT: 38 U/L (ref 0–44)
ALT: 43 U/L (ref 0–44)
AST: 38 U/L (ref 15–41)
AST: 38 U/L (ref 15–41)
Albumin: 4.2 g/dL (ref 3.5–5.0)
Albumin: 3.1 g/dL — ABNORMAL LOW (ref 3.5–5.0)
Alkaline Phosphatase: 104 U/L (ref 38–126)
Alkaline Phosphatase: 98 U/L (ref 38–126)
Anion gap: 7 (ref 5–15)
Anion gap: 8 (ref 5–15)
BUN: 17 mg/dL (ref 8–23)
BUN: 12 mg/dL (ref 8–23)
CO2: 21 mmol/L — ABNORMAL LOW (ref 22–32)
CO2: 19 mmol/L — ABNORMAL LOW (ref 22–32)
Calcium: 8.6 mg/dL — ABNORMAL LOW (ref 8.9–10.3)
Calcium: 7.2 mg/dL — ABNORMAL LOW (ref 8.9–10.3)
Chloride: 112 mmol/L — ABNORMAL HIGH (ref 98–111)
Chloride: 115 mmol/L — ABNORMAL HIGH (ref 98–111)
Creatinine, Ser: 1.01 mg/dL — ABNORMAL HIGH (ref 0.44–1.00)
Creatinine, Ser: 0.94 mg/dL (ref 0.44–1.00)
GFR, Estimated: 52 mL/min — ABNORMAL LOW (ref >60)
GFR, Estimated: 57 mL/min — ABNORMAL LOW (ref >60)
Glucose, Bld: 122 mg/dL — ABNORMAL HIGH (ref 70–99)
Glucose, Bld: 121 mg/dL — ABNORMAL HIGH (ref 70–99)
Potassium: 3.7 mmol/L (ref 3.5–5.1)
Potassium: 3.8 mmol/L (ref 3.5–5.1)
Sodium: 140 mmol/L (ref 135–145)
Sodium: 142 mmol/L (ref 135–145)
Total Bilirubin: 1.1 mg/dL (ref 0.3–1.2)
Total Bilirubin: 0.5 mg/dL (ref 0.3–1.2)
Total Protein: 7 g/dL (ref 6.5–8.1)
Total Protein: 5.1 g/dL — ABNORMAL LOW (ref 6.5–8.1)

## 2022-08-26 LAB — I-STAT CHEM 8, ED
BUN: 17 mg/dL (ref 8–23)
Calcium, Ion: 1.09 mmol/L — ABNORMAL LOW (ref 1.15–1.40)
Chloride: 109 mmol/L (ref 98–111)
Creatinine, Ser: 1 mg/dL (ref 0.44–1.00)
Glucose, Bld: 120 mg/dL — ABNORMAL HIGH (ref 70–99)
HCT: 39 % (ref 36.0–46.0)
Hemoglobin: 13.3 g/dL (ref 12.0–15.0)
Potassium: 3.7 mmol/L (ref 3.5–5.1)
Sodium: 142 mmol/L (ref 135–145)
TCO2: 21 mmol/L — ABNORMAL LOW (ref 22–32)

## 2022-08-26 LAB — ECHOCARDIOGRAM COMPLETE
Area-P 1/2: 3.58 cm2
Height: 64 in
MV M vel: 4.11 m/s
MV Peak grad: 67.6 mmHg
Radius: 0.3 cm
S' Lateral: 2.9 cm
Weight: 2680.79 oz

## 2022-08-26 LAB — RESP PANEL BY RT-PCR (FLU A&B, COVID) ARPGX2
Influenza A by PCR: NEGATIVE
Influenza B by PCR: NEGATIVE
SARS Coronavirus 2 by RT PCR: NEGATIVE

## 2022-08-26 LAB — ETHANOL: Alcohol, Ethyl (B): <10 mg/dL (ref <10)

## 2022-08-26 LAB — CBG MONITORING, ED: Glucose-Capillary: 123 mg/dL — ABNORMAL HIGH (ref 70–99)

## 2022-08-26 LAB — MAGNESIUM: Magnesium: 1.7 mg/dL (ref 1.7–2.4)

## 2022-08-26 LAB — MRSA NEXT GEN BY PCR, NASAL: MRSA by PCR Next Gen: NOT DETECTED

## 2022-08-26 LAB — APTT: aPTT: 28 seconds (ref 24–36)

## 2022-08-26 LAB — PROTIME-INR
INR: 1.1 (ref 0.8–1.2)
Prothrombin Time: 14.4 seconds (ref 11.4–15.2)

## 2022-08-26 SURGERY — RADIOLOGY WITH ANESTHESIA
Anesthesia: General

## 2022-08-26 MED ORDER — ACETAMINOPHEN 650 MG RE SUPP: 650.0000 mg | RECTAL | Status: DC | PRN

## 2022-08-26 MED ORDER — POTASSIUM CHLORIDE 10 MEQ/100ML IV SOLN
10.0000 meq | INTRAVENOUS | Status: DC
  Filled 2022-08-26: qty 100

## 2022-08-26 MED ORDER — IOHEXOL 300 MG/ML  SOLN: 100.0000 mL | Freq: Once | INTRAMUSCULAR | Status: DC | PRN

## 2022-08-26 MED ORDER — ROCURONIUM BROMIDE 10 MG/ML (PF) SYRINGE
PREFILLED_SYRINGE | INTRAVENOUS | Status: DC | PRN
  Administered 2022-08-26: 30 mg via INTRAVENOUS

## 2022-08-26 MED ORDER — FENTANYL CITRATE PF 50 MCG/ML IJ SOSY
25.0000 ug | PREFILLED_SYRINGE | INTRAMUSCULAR | Status: DC | PRN
  Administered 2022-08-26: 50 ug via INTRAVENOUS
  Filled 2022-08-26: qty 2

## 2022-08-26 MED ORDER — FENTANYL CITRATE (PF) 100 MCG/2ML IJ SOLN
INTRAMUSCULAR | Status: AC
  Filled 2022-08-26: qty 2

## 2022-08-26 MED ORDER — PHENYLEPHRINE HCL-NACL 20-0.9 MG/250ML-% IV SOLN
INTRAVENOUS | Status: DC | PRN
  Administered 2022-08-26: 50 ug/min via INTRAVENOUS

## 2022-08-26 MED ORDER — AMIODARONE LOAD VIA INFUSION: 150.0000 mg | Freq: Once | INTRAVENOUS | Status: DC

## 2022-08-26 MED ORDER — ACETAMINOPHEN 160 MG/5ML PO SOLN: 650.0000 mg | ORAL | Status: DC | PRN

## 2022-08-26 MED ORDER — AMIODARONE HCL IN DEXTROSE 360-4.14 MG/200ML-% IV SOLN
60.0000 mg/h | INTRAVENOUS | Status: AC
  Administered 2022-08-26: 60 mg/h via INTRAVENOUS
  Filled 2022-08-26: qty 200

## 2022-08-26 MED ORDER — ONDANSETRON HCL 4 MG/2ML IJ SOLN: 4.0000 mg | Freq: Four times a day (QID) | INTRAMUSCULAR | Status: DC | PRN

## 2022-08-26 MED ORDER — POLYETHYLENE GLYCOL 3350 17 G PO PACK
17.0000 g | PACK | Freq: Every day | ORAL | Status: DC
  Administered 2022-08-27: 17 g
  Filled 2022-08-26: qty 1

## 2022-08-26 MED ORDER — FENTANYL CITRATE PF 50 MCG/ML IJ SOSY: 25.0000 ug | PREFILLED_SYRINGE | INTRAMUSCULAR | Status: DC | PRN

## 2022-08-26 MED ORDER — STROKE: EARLY STAGES OF RECOVERY BOOK: Freq: Once | Status: DC

## 2022-08-26 MED ORDER — FENTANYL CITRATE (PF) 100 MCG/2ML IJ SOLN
INTRAMUSCULAR | Status: DC | PRN
  Administered 2022-08-26: 100 ug via INTRAVENOUS

## 2022-08-26 MED ORDER — ORAL CARE MOUTH RINSE
15.0000 mL | OROMUCOSAL | Status: DC
  Administered 2022-08-26 – 2022-08-27 (×11): 15 mL via OROMUCOSAL

## 2022-08-26 MED ORDER — CHLORHEXIDINE GLUCONATE CLOTH 2 % EX PADS
6.0000 | MEDICATED_PAD | Freq: Every day | CUTANEOUS | Status: DC
  Administered 2022-08-26 – 2022-08-27 (×2): 6 via TOPICAL

## 2022-08-26 MED ORDER — PROPOFOL 1000 MG/100ML IV EMUL
INTRAVENOUS | Status: AC
  Filled 2022-08-26: qty 100

## 2022-08-26 MED ORDER — ACETAMINOPHEN 325 MG PO TABS
650.0000 mg | ORAL_TABLET | ORAL | Status: DC | PRN
  Administered 2022-08-29: 650 mg via ORAL
  Filled 2022-08-26: qty 2

## 2022-08-26 MED ORDER — PROPOFOL 1000 MG/100ML IV EMUL
0.0000 ug/kg/min | INTRAVENOUS | Status: DC
  Administered 2022-08-26: 5 ug/kg/min via INTRAVENOUS
  Administered 2022-08-26: 20 ug/kg/min via INTRAVENOUS
  Filled 2022-08-26: qty 100

## 2022-08-26 MED ORDER — CLEVIDIPINE BUTYRATE 0.5 MG/ML IV EMUL
INTRAVENOUS | Status: AC
  Administered 2022-08-26: 2 mg/h via INTRAVENOUS
  Filled 2022-08-26: qty 50

## 2022-08-26 MED ORDER — SODIUM CHLORIDE 0.9 % IV SOLN: INTRAVENOUS | Status: DC

## 2022-08-26 MED ORDER — ORAL CARE MOUTH RINSE: 15.0000 mL | OROMUCOSAL | Status: DC | PRN

## 2022-08-26 MED ORDER — METOPROLOL TARTRATE 5 MG/5ML IV SOLN: 2.5000 mg | INTRAVENOUS | Status: DC | PRN

## 2022-08-26 MED ORDER — CLEVIDIPINE BUTYRATE 0.5 MG/ML IV EMUL
0.0000 mg/h | INTRAVENOUS | Status: DC
  Administered 2022-08-26: 3 mg/h via INTRAVENOUS
  Filled 2022-08-26: qty 100

## 2022-08-26 MED ORDER — ASPIRIN 300 MG RE SUPP
300.0000 mg | Freq: Every day | RECTAL | Status: DC
  Administered 2022-08-26: 300 mg via RECTAL
  Filled 2022-08-26: qty 1

## 2022-08-26 MED ORDER — AMIODARONE LOAD VIA INFUSION
150.0000 mg | Freq: Once | INTRAVENOUS | Status: DC
  Filled 2022-08-26 (×3): qty 83.34

## 2022-08-26 MED ORDER — DOCUSATE SODIUM 50 MG/5ML PO LIQD
100.0000 mg | Freq: Two times a day (BID) | ORAL | Status: DC
  Filled 2022-08-26 (×2): qty 10

## 2022-08-26 MED ORDER — FENTANYL CITRATE (PF) 100 MCG/2ML IJ SOLN
50.0000 ug | INTRAMUSCULAR | Status: DC | PRN
  Administered 2022-08-26: 50 ug via INTRAVENOUS

## 2022-08-26 MED ORDER — ESMOLOL HCL 100 MG/10ML IV SOLN
INTRAVENOUS | Status: DC | PRN
  Administered 2022-08-26 (×2): 20 mg via INTRAVENOUS

## 2022-08-26 MED ORDER — ASPIRIN 325 MG PO TABS
325.0000 mg | ORAL_TABLET | Freq: Every day | ORAL | Status: DC
  Administered 2022-08-27: 325 mg via ORAL
  Filled 2022-08-26: qty 1

## 2022-08-26 MED ORDER — AMIODARONE HCL IN DEXTROSE 360-4.14 MG/200ML-% IV SOLN
30.0000 mg/h | INTRAVENOUS | Status: DC
  Filled 2022-08-26: qty 200

## 2022-08-26 MED ORDER — LACTATED RINGERS IV BOLUS
500.0000 mL | Freq: Once | INTRAVENOUS | Status: AC
  Administered 2022-08-26: 500 mL via INTRAVENOUS

## 2022-08-26 MED ORDER — MAGNESIUM SULFATE 2 GM/50ML IV SOLN
2.0000 g | Freq: Once | INTRAVENOUS | Status: AC
  Administered 2022-08-26: 2 g via INTRAVENOUS
  Filled 2022-08-26: qty 50

## 2022-08-26 MED ORDER — AMIODARONE HCL IN DEXTROSE 360-4.14 MG/200ML-% IV SOLN: 60.0000 mg/h | INTRAVENOUS | Status: DC

## 2022-08-26 MED ORDER — AMIODARONE HCL IN DEXTROSE 360-4.14 MG/200ML-% IV SOLN
30.0000 mg/h | INTRAVENOUS | Status: DC
  Administered 2022-08-27: 30 mg/h via INTRAVENOUS
  Filled 2022-08-26: qty 200

## 2022-08-26 MED ORDER — FAMOTIDINE 20 MG PO TABS
20.0000 mg | ORAL_TABLET | Freq: Every day | ORAL | Status: DC
  Administered 2022-08-27: 20 mg
  Filled 2022-08-26: qty 1

## 2022-08-26 MED ORDER — PROPOFOL 1000 MG/100ML IV EMUL: 5.0000 ug/kg/min | INTRAVENOUS | Status: DC

## 2022-08-26 MED ORDER — FUROSEMIDE 10 MG/ML IJ SOLN: 20.0000 mg | Freq: Once | INTRAMUSCULAR | Status: DC

## 2022-08-26 MED ORDER — ONDANSETRON HCL 4 MG/2ML IJ SOLN
INTRAMUSCULAR | Status: DC | PRN
  Administered 2022-08-26: 4 mg via INTRAVENOUS

## 2022-08-26 MED ORDER — ACETAMINOPHEN 325 MG PO TABS: 650.0000 mg | ORAL_TABLET | ORAL | Status: DC | PRN

## 2022-08-26 MED ORDER — IOHEXOL 350 MG/ML SOLN
100.0000 mL | Freq: Once | INTRAVENOUS | Status: AC | PRN
  Administered 2022-08-26: 100 mL via INTRAVENOUS

## 2022-08-26 MED ORDER — LIDOCAINE 2% (20 MG/ML) 5 ML SYRINGE
INTRAMUSCULAR | Status: DC | PRN
  Administered 2022-08-26: 60 mg via INTRAVENOUS

## 2022-08-26 MED ORDER — SUCCINYLCHOLINE CHLORIDE 200 MG/10ML IV SOSY
PREFILLED_SYRINGE | INTRAVENOUS | Status: DC | PRN
  Administered 2022-08-26: 120 mg via INTRAVENOUS

## 2022-08-26 MED ORDER — AMIODARONE LOAD VIA INFUSION
150.0000 mg | Freq: Once | INTRAVENOUS | Status: AC
  Administered 2022-08-26: 150 mg via INTRAVENOUS
  Filled 2022-08-26: qty 83.34

## 2022-08-26 MED ORDER — SODIUM CHLORIDE 0.9 % IV SOLN: INTRAVENOUS | Status: DC | PRN

## 2022-08-26 MED ORDER — METOPROLOL TARTRATE 5 MG/5ML IV SOLN
INTRAVENOUS | Status: AC
  Filled 2022-08-26: qty 5

## 2022-08-26 MED ORDER — PROPOFOL 500 MG/50ML IV EMUL
INTRAVENOUS | Status: DC | PRN
  Administered 2022-08-26: 25 ug/kg/min via INTRAVENOUS

## 2022-08-26 MED ORDER — ETOMIDATE 2 MG/ML IV SOLN
INTRAVENOUS | Status: DC | PRN
  Administered 2022-08-26: 18 mg via INTRAVENOUS

## 2022-08-26 NOTE — ED Triage Notes (Signed)
Pt bib EMS after she woke up c/o SOB. Upon EMS arrival, pt was noted to have L sided weakness, slurred speech, facial droop (L), and gait disturbance. Last Known well was 2200 yesterday. EDP at bedside upon pt arrival.

## 2022-08-26 NOTE — Progress Notes (Signed)
RT spoke with NP about obtaining ABG. NP requested to hold ABG at this time due to trying to extubate patient.

## 2022-08-26 NOTE — ED Notes (Signed)
Pt transferred to CT.

## 2022-08-26 NOTE — Progress Notes (Signed)
PT Cancellation Note  Patient Details Name: DELLENE MCGROARTY MRN: [id number] DOB: 07-14-1928   Cancelled Treatment:    Reason Eval/Treat Not Completed: Active bedrest order   Enedina Finner Zakaiya Lares 08/26/2022, 12:58 PM Merryl Hacker, PT Acute Rehabilitation Services Office: [phone]

## 2022-08-26 NOTE — H&P (Signed)
Neurology H&P  CC: Left-sided weakness  History is obtained from: Patient, teleneurology  HPI: Sheila Jordan is a 88 y.o. female with a history of hypertension who is independent at baseline, lives alone and continues to drive.  She was in her normal state of health at 10 PM, but then awoke at 4 AM with difficulty moving her left side.  She was also having some shortness of breath.  On evaluation at [facility], ED, she was noted to be in no respiratory distress and satting well.  She was also noticed to have some left-sided weakness and therefore teleneurology was consulted.  On his evaluation, she had an NIH of six.  She had a CT/CTP which demonstrated a M2 occlusion with favorable perfusion profile.  She was therefore transferred emergently to [facility].  On arrival, she was in NAD, but with laying her flat she did seem to become slightly distressed, complaining of shortness of breath.  We initially were planning on repeating some imaging given the duration since her initial scan, however she was unable to tolerate it.  In this setting, given the risk of sedation for further imaging, coupled with the time sensitive nature of intervention, it was decided to proceed to thrombectomy without further delay.   LKW: 10 PM tpa given?: No, outside of window IR Thrombectomy?  Yes Modified Rankin Scale: 0-Completely asymptomatic and back to baseline post- stroke NIHSS: 6  Past Medical History:  Diagnosis Date   Arthritis    Hypertension      Family History  Problem Relation Age of Onset   Other Father        heart condition   Parkinson's disease Mother    Breast cancer Daughter      Social History:  reports that she has never smoked. She has never used smokeless tobacco. She reports that she does not drink alcohol and does not use drugs.   Prior to Admission medications   Medication Sig Start Date End Date Taking? Authorizing Provider  alum & mag hydroxide-simeth (MYLANTA MAXIMUM  STRENGTH) 400-400-40 MG/5ML suspension Take 15 mLs by mouth every 6 (six) hours as needed for indigestion. 08/21/22   Leath-Warren, Alda Lea, NP  ferrous sulfate 325 (65 FE) MG EC tablet Take 1 tablet (325 mg total) by mouth 2 (two) times daily. 06/21/20 08/20/20  Antonieta Pert, MD  nystatin-triamcinolone ointment (MYCOLOG) Apply 1 application topically 2 (two) times daily. To affected area. 08/02/19   Jonnie Kind, MD  omeprazole (PRILOSEC) 20 MG capsule Take 1 capsule (20 mg total) by mouth daily. 08/21/22   Leath-Warren, Alda Lea, NP  Vitamin D, Ergocalciferol, (DRISDOL) 1.25 MG (50000 UNIT) CAPS capsule Take 50,000 Units by mouth once a week. 05/19/20   [provider]     Exam: Current vital signs: BP (!) 167/93   Pulse 79   Temp 98.6 F (37 C)   Resp (!) 24   Ht 5\' 4"  (1.626 m)   Wt 76 kg   SpO2 98%   BMI 28.76 kg/m    Physical Exam  Constitutional: Appears well-developed and well-nourished.  Psych: Affect appropriate to situation Eyes: No scleral injection HENT: No OP obstrucion Head: Normocephalic.  Cardiovascular: Normal rate and regular rhythm.  Respiratory: Effort normal, no apparent distress when sitting up GI: Soft.  No distension. There is no tenderness.  Skin: WDI  Neuro: Mental Status: Patient is awake, alert, oriented to person, place, month, year, and situation. Patient is able to give a clear  and coherent history. No signs of aphasia She does have left-sided neglect Cranial Nerves: II: Visual Fields are full. Pupils are equal, round, and reactive to light.   III,IV, VI: EOMI without ptosis or diploplia.  V: Facial sensation is symmetric to temperature VII: Facial movement is weak on the left VIII: hearing is intact to voice X: Uvula elevates symmetrically XI: Shoulder shrug is symmetric. XII: tongue is midline without atrophy or fasciculations.  Motor: Tone is normal. Bulk is normal.  She has mild weakness of the left arm and  leg Sensory: Sensation is diminished on the left, in addition there is significant extinction Cerebellar: No clear ataxia  I have reviewed labs in epic and the pertinent results are: Creatinine 1.01   I have reviewed the images obtained: CT/CTP-significant penumbra in the right parietal region with M2 occlusion  Primary Diagnosis:  Cerebral infarction due to embolism of  right middle cerebral artery.   Secondary Diagnosis: Essential (primary) hypertension, CKD Stage 3 (GFR 30-59), and Hypocalcemia   Impression: 88 year old female who presents with right M2 occlusion.  A-fib is certainly likely etiology.  She does indicate a desire to have thrombectomy, understanding that she would need to be intubated to do so.  Unfortunately, I am not sure she has the capacity to get formal consent given the degree of neglect, and I was unable to reach family after multiple attempts.  We will proceed with two-physician emergency consent at this time.  She will need to be admitted, and given shortness of breath as well as a run of tachycardia just prior to the procedure, we will ask critical care to evaluate for this as well.  Due to preintubation SOB and tachycardia, she was not extubated post procedure.  Plan:  - HgbA1c, fasting lipid panel - MRI of the brain without contrast - Frequent neuro checks - Echocardiogram - Prophylactic therapy-ASA 325 PO or 300PR - Telemetry monitoring - PT consult, OT consult, Speech consult - CCM consult for SOB/continued intubation.  - Stroke team to follow   This patient is critically ill and at significant risk of neurological worsening, death and care requires constant monitoring of vital signs, hemodynamics,respiratory and cardiac monitoring, neurological assessment, discussion with family, other specialists and medical decision making of high complexity. I spent 60 minutes of neurocritical care time  in the care of  this patient. This was time spent  independent of any time provided by nurse practitioner or PA.  Ritta Slot, MD [facility] [phone]  If 7pm- 7am, please page neurology on call as listed in AMION. 08/26/2022  10:13 AM

## 2022-08-26 NOTE — Progress Notes (Signed)
Patient transported on ventilator to MRI and back to 3M11 with no complications. Vitals stable.

## 2022-08-26 NOTE — ED Notes (Signed)
Patient transported to CT 

## 2022-08-26 NOTE — Procedures (Addendum)
88 y.o. female. Presented to  [facility] with shortness of breath and left-sided weakness, NIHSS 6. Her last known well was 10 p.m. on 08/25/2022. Head CT showed no acute infarct (ASPECTS 10). CT angiogram of the head and neck showed an occlusion of a proximal right M2/MCA posterior middle branch. She did not received thrombolytic as she was outside the window. She was transferred to [facility] for mechanical thrombectomy. A repeat head CT was obtained, however, the study was significantly degraded by motion. Decision made to proceed with mechanical thrombectomy.  Successful mechanical thrombectomy with direct contact aspiration for treatment of a right M2/MCA proximal occlusion achieving complete recanalization (TICI 3) after 1 pass. Procedure performed by Dr. Ainsley Spinner  Patient seen in ICU  post uncomplicated cerebral angiogram   today with Dr. Ainsley Spinner  Patient seen at bedside with Dr. Kirtland Bouchard. Tommie Sams. Family presented.  Patient remains intubated but able to follow commands. Can shake and nod her head appropriately.  No facial droop noted. Can spontaneously move all 4 extremities.   Right CFA puncture site clean, dry, dressed appropriately, no active bleeding, soft, appropriately tender to palpation.  MRI Pending.     NIR to continue to follow.

## 2022-08-26 NOTE — Procedures (Signed)
INTERVENTIONAL NEURORADIOLOGY BRIEF POSTPROCEDURE NOTE  DIAGNOSTIC CEREBRAL ANGIOGRAM AND MECHANICAL THROMBECTOMY  Attending: Dr. Baldemar Lenis  Diagnosis: Right M2/MCA occlusion  Access site: RCFA  Access closure: Perclose Prostyle  Anesthesia: GETA  Medication used: Refer to anesthesia documentation.  Complications: None.  Estimated blood loss: ~ 10 mL  Specimen: None.  Findings: Occlusion of a proximal M2/MCA branch. Mechanical thrombectomy performed with direct contact aspiration achieving complete recanalization after 1 pass (TICI3). No thromboembolic or hemorrhagic complication.  The was in AFIB/RVR and dyspneic immediately before the start of intervention. Therefore, decision made to maintain intubated.  PLAN: - SBP 120-140 mmHg - Bed rest x 4 hours

## 2022-08-26 NOTE — Progress Notes (Signed)
SLP Cancellation Note  Patient Details Name: LATAISHA COLAN MRN: [id number] DOB: March 10, 1928   Cancelled treatment:       Reason Eval/Treat Not Completed: Medical issues which prohibited therapy  Unable to complete cognitive linguistic evaluation, as pt is still on vent. Will continue efforts.   Yesennia Hirota B. Murvin Natal, [facility], CCC-SLP Speech Language Pathologist Office: [phone]  Leigh Aurora 08/26/2022, 2:37 PM

## 2022-08-26 NOTE — ED Provider Notes (Signed)
Care of patient assumed from Dr. Oletta Cohn.  Last known well was 10 PM.  Patient presents with strokelike symptoms.  Currently awaiting CTA and further neurology recommendations. Physical Exam  BP (!) 175/89   Pulse 78   Temp 98.6 F (37 C)   Resp (!) 22   Ht 5\' 4"  (1.626 m)   Wt 76 kg   SpO2 97%   BMI 28.76 kg/m   Physical Exam Constitutional:      General: She is not in acute distress.    Appearance: She is not ill-appearing, toxic-appearing or diaphoretic.  HENT:     Head: Normocephalic and atraumatic.     Right Ear: External ear normal.     Left Ear: External ear normal.     Nose: Nose normal.  Cardiovascular:     Rate and Rhythm: Normal rate. Rhythm irregular.  Pulmonary:     Effort: Tachypnea present. No respiratory distress.  Abdominal:     General: There is no distension.     Palpations: Abdomen is soft.  Skin:    General: Skin is dry.  Neurological:     Mental Status: She is alert.     Procedures  Procedures  ED Course / MDM    Medical Decision Making Amount and/or Complexity of Data Reviewed Labs: ordered. Radiology: ordered.  Risk Decision regarding hospitalization.   Patient endorsing shortness of breath and tachypnea.  She was placed on supplemental oxygen with improvement in her symptoms.  Chest x-ray and COVID testing were ordered.  Following further discussions with neurology team, patient was transferred to [facility] for planned thrombectomy.       [facility], MD 08/26/22 1010

## 2022-08-26 NOTE — ED Provider Notes (Signed)
[facility] EMERGENCY DEPARTMENT Provider Note   CSN: [id number] Arrival date & time: 08/26/22  9629     History  Chief Complaint  Patient presents with   Weakness    Sheila Jordan is a 88 y.o. female.  Patient brought to the emergency department from home by ambulance.  Patient reports that she woke up around 4 AM and felt short of breath.  She does live alone.  She went to bed around 10 PM.  She called her granddaughter who lives next door.  Granddaughter came over and called EMS.  EMS reports that she did seem short of breath upon their arrival but her oxygen saturations were normal and her lungs were clear.  They put her on oxygen for comfort.  At that time they noticed that she had a left facial droop and slurred speech.  Patient did not notice this but family does report that this is new.  Last known normal is unknown as she lives alone.       Home Medications Prior to Admission medications   Medication Sig Start Date End Date Taking? Authorizing Provider  alum & mag hydroxide-simeth (MYLANTA MAXIMUM STRENGTH) 400-400-40 MG/5ML suspension Take 15 mLs by mouth every 6 (six) hours as needed for indigestion. 08/21/22   Leath-Warren, Sadie Haber, NP  ferrous sulfate 325 (65 FE) MG EC tablet Take 1 tablet (325 mg total) by mouth 2 (two) times daily. 06/21/20 08/20/20  Lanae Boast, MD  nystatin-triamcinolone ointment (MYCOLOG) Apply 1 application topically 2 (two) times daily. To affected area. 08/02/19   Tilda Burrow, MD  omeprazole (PRILOSEC) 20 MG capsule Take 1 capsule (20 mg total) by mouth daily. 08/21/22   Leath-Warren, Sadie Haber, NP  Vitamin D, Ergocalciferol, (DRISDOL) 1.25 MG (50000 UNIT) CAPS capsule Take 50,000 Units by mouth once a week. 05/19/20   [provider]      Allergies    Patient has no known allergies.    Review of Systems   Review of Systems  Physical Exam Updated Vital Signs BP (!) 175/89   Pulse 78   Temp 98.6 F (37 C)   Resp (!)  22   Ht 5\' 4"  (1.626 m)   Wt 76 kg   SpO2 97%   BMI 28.76 kg/m  Physical Exam Vitals and nursing note reviewed.  Constitutional:      General: She is not in acute distress.    Appearance: She is well-developed.  HENT:     Head: Normocephalic and atraumatic.     Mouth/Throat:     Mouth: Mucous membranes are moist.  Eyes:     General: Vision grossly intact. Gaze aligned appropriately.     Extraocular Movements: Extraocular movements intact.     Conjunctiva/sclera: Conjunctivae normal.  Cardiovascular:     Rate and Rhythm: Normal rate and regular rhythm.     Pulses: Normal pulses.     Heart sounds: Normal heart sounds, S1 normal and S2 normal. No murmur heard.    No friction rub. No gallop.  Pulmonary:     Effort: Pulmonary effort is normal. No respiratory distress.     Breath sounds: Wheezing (Slight at bases) present.  Abdominal:     General: Bowel sounds are normal.     Palpations: Abdomen is soft.     Tenderness: There is no abdominal tenderness. There is no guarding or rebound.     Hernia: No hernia is present.  Musculoskeletal:        General: No  swelling.     Cervical back: Full passive range of motion without pain, normal range of motion and neck supple. No spinous process tenderness or muscular tenderness. Normal range of motion.     Right lower leg: No edema.     Left lower leg: No edema.  Skin:    General: Skin is warm and dry.     Capillary Refill: Capillary refill takes less than 2 seconds.     Findings: No ecchymosis, erythema, rash or wound.  Neurological:     General: No focal deficit present.     Mental Status: She is alert and oriented to person, place, and time.     GCS: GCS eye subscore is 4. GCS verbal subscore is 5. GCS motor subscore is 6.     Cranial Nerves: Facial asymmetry (left droop) present.     Sensory: Sensation is intact.     Motor: Motor function is intact.     Coordination: Coordination is intact.  Psychiatric:        Attention and  Perception: Attention normal.        Mood and Affect: Mood normal.        Speech: Speech normal.        Behavior: Behavior normal.     ED Results / Procedures / Treatments   Labs (all labs ordered are listed, but only abnormal results are displayed) Labs Reviewed  COMPREHENSIVE METABOLIC PANEL - Abnormal; Notable for the following components:      Result Value   Chloride 112 (*)    CO2 21 (*)    Glucose, Bld 122 (*)    Creatinine, Ser 1.01 (*)    Calcium 8.6 (*)    GFR, Estimated 52 (*)    All other components within normal limits  I-STAT CHEM 8, ED - Abnormal; Notable for the following components:   Glucose, Bld 120 (*)    Calcium, Ion 1.09 (*)    TCO2 21 (*)    All other components within normal limits  CBG MONITORING, ED - Abnormal; Notable for the following components:   Glucose-Capillary 123 (*)    All other components within normal limits  ETHANOL  PROTIME-INR  APTT  CBC  DIFFERENTIAL  RAPID URINE DRUG SCREEN, HOSP PERFORMED  URINALYSIS, ROUTINE W REFLEX MICROSCOPIC    EKG None  Radiology CT HEAD WO CONTRAST  Result Date: 08/26/2022 CLINICAL DATA:  Left arm weakness EXAM: CT HEAD WITHOUT CONTRAST TECHNIQUE: Contiguous axial images were obtained from the base of the skull through the vertex without intravenous contrast. RADIATION DOSE REDUCTION: This exam was performed according to the departmental dose-optimization program which includes automated exposure control, adjustment of the mA and/or kV according to patient size and/or use of iterative reconstruction technique. COMPARISON:  None Available. FINDINGS: Brain: No evidence of acute infarction, hemorrhage, hydrocephalus, extra-axial collection or mass lesion/mass effect. Patchy low-density in the cerebral white matter attributed to chronic small vessel ischemia. Low-density areas in the right thalamus and right pons appear discrete on reformats and are attributed to chronic lacune. Age normal brain volume  Vascular: No hyperdense vessel or unexpected calcification. Extensive atheromatous calcification Skull: Negative Sinuses/Orbits: Negative Other: Prelim sent in epic chat IMPRESSION: 1. No acute finding.  ASPECTS is 10. 2. Chronic small vessel ischemia. Electronically Signed   By: Jorje Guild M.D.   On: 08/26/2022 06:27    Procedures Procedures    Medications Ordered in ED Medications  iohexol (OMNIPAQUE) 350 MG/ML injection 100 mL (has no administration  in time range)    ED Course/ Medical Decision Making/ A&P                           Medical Decision Making Amount and/or Complexity of Data Reviewed Labs: ordered. Radiology: ordered.   Presents to the emergency department for evaluation.  She woke up at 4 AM and felt short of breath.  Family called EMS.  Patient lives alone.  She reports that she went to bed around 10 PM.  EMS noted left hemiparesis upon their arrival.  Last known well is unclear.  Not a candidate for tPA at arrival to the ED.  Stroke evaluation was initiated.  She is noted to have left facial droop, fairly significant left upper and left lower extremity weakness.  Screening CT does not show any bleeds.  No obvious infarct noted.  Telemetry neurology consultation has been performed.  Patient will go back to radiology for CT angiography and perfusion study.  If there is something for intervention, will require transfer to [facility] for IR.  If not, will be admitted here at [facility] for stroke work-up.  Will sign to oncoming ER physician to follow-up teleneurology recommendations based on CT angiography/perfusion.  CRITICAL CARE Performed by: Orpah Greek   Total critical care time: 35 minutes  Critical care time was exclusive of separately billable procedures and treating other patients.  Critical care was necessary to treat or prevent imminent or life-threatening deterioration.  Critical care was time spent personally by me on the following  activities: development of treatment plan with patient and/or surrogate as well as nursing, discussions with consultants, evaluation of patient's response to treatment, examination of patient, obtaining history from patient or surrogate, ordering and performing treatments and interventions, ordering and review of laboratory studies, ordering and review of radiographic studies, pulse oximetry and re-evaluation of patient's condition.         Final Clinical Impression(s) / ED Diagnoses Final diagnoses:  Cerebrovascular accident (CVA), unspecified mechanism ([place])    Rx / DC Orders ED Discharge Orders     None         Eyoel Throgmorton, Gwenyth Allegra, MD 08/26/22 [id number]

## 2022-08-26 NOTE — Transfer of Care (Signed)
Immediate Anesthesia Transfer of Care Note  Patient: Sheila Jordan  Procedure(s) Performed: RADIOLOGY WITH ANESTHESIA  Patient Location: PACU  Anesthesia Type:General  Level of Consciousness: sedated and unresponsive  Airway & Oxygen Therapy: Patient remains intubated per anesthesia plan and Patient placed on Ventilator (see vital sign flow sheet for setting)  Post-op Assessment: Report given to RN and Post -op Vital signs reviewed and stable  Post vital signs: Reviewed and stable  Last Vitals:  Vitals Value Taken Time  BP 158/77 08/26/22 1018  Temp 36.5 C 08/26/22 1007  Pulse 71 08/26/22 1019  Resp 18 08/26/22 1019  SpO2 100 % 08/26/22 1019  Vitals shown include unvalidated device data.  Last Pain:  Vitals:   08/26/22 0613  PainSc: 0-No pain         Complications: No notable events documented.

## 2022-08-26 NOTE — Code Documentation (Signed)
Ms Sheila Jordan is a 88 yr old female with PMH of GERD transferring to [facility] from [facility] for mechanical thrombectomy.    Pt arrived [facility] 0903. NIHSS 6. Deficits are: Lt weakness, sensory loss, left droop, left neglect and partial right gaze preference. Pt is not a thrombolysis candidate due to OOW.    Pt taken for repeat CTNC. Pt having some difficulty maintaining airway while flat. BP 183/101.  From report from [facility], pt has Rt MCA M2 occlusion with no core and 48 penumbra. Covid swab has been collected. No family has been reached, two Physician Emergency consent obtained. Pt taken to IR suite at 0915. Bedside report to IR RN.

## 2022-08-26 NOTE — Consult Note (Addendum)
[facility] TeleNeurology Consult Services  Stat Consult  Patient Name:   Sheila Jordan, Sheila Jordan Date of Birth:   April 25, 1928 Identification Number:   MRN - [id number] Date of Service:   08/26/2022 06:34:22  Diagnosis:       I63.311 - Cerebrovascular accident (CVA) due to thrombosis of right middle cerebral artery (HCCC)  Impression 88 year old female with history of GERD presenting with likely acute ischemic stroke of right MCA/M2 branch resulting in left hemiparesis, ataxia and dysarthria, with favorable CT perfusion imaging. Patient was not a candidate for IV thrombolysis due to being last known normal > 4.5 hours prior to arrival (last normal at 2200 on 08/25/2022, woke up this morning with deficits). Patient has M2 branch occlusion with favorable perfusion scan (core = 0, penumbra = 48 mL). Patient accepted for transfer to [facility] for thrombectomy, much appreciated.  Discussed with NIR Text: Called neurointerventional on-call, requested I call on-call neurologist at [facility] via [facility]. Spoke with Dr. Amada Jupiter, neurology on-call who accepted patient for thrombectomy transfer.   Recommendations: Our recommendations are outlined below.  Consultations : Recommend Speech therapy if failed dysphagia screenPhysical therapy/Occupational therapy  DVT Prophylaxis : Choice of Primary Team  Disposition : Neurology will follow  Miscellaneous : Post-thrombectomy recommendations including BP parameters, monitoring/follow-up imaging, and timing for starting antithrombotic per accepting neurology/neurointerventional teams, much appreciated.   ----------------------------------------------------------------------------------------------------   Advanced Imaging: CTA Head and Neck Completed.  CTP Completed.  LVO:Yes  Discussed with NIR :Yes  Initial Call Time To NIR : 08/26/2022 07:49:00  NIR Accept/Decision Time : 08/26/2022 08:07:00  Discussed  with NIR Time : 08/26/2022 07:49:00  Discussed with NIR Text : Called neurointerventional on-call, requested I call on-call neurologist at [facility] via [facility]. Spoke with Dr. Amada Jupiter, neurology on-call who accepted patient for thrombectomy transfer.  Neurointerventionalist Accepted Case : Case accepted for intervention.    Metrics: [employer] Notification Time: 08/26/2022 06:31:50 Stamp Time: 08/26/2022 06:34:22 Callback Response Time: 08/26/2022 06:40:11  Primary Provider Notified of Diagnostic Impression and Management Plan on: 08/26/2022 07:05:18   CT HEAD: Reviewed No clear acute infarct or hemorrhage, chronic microvascular disease appreciated   Imaging reviewed  Labs reviewed   ----------------------------------------------------------------------------------------------------  Chief Complaint: left sided weakness  History of Present Illness: Patient is a 88 year old Female. Patient is a 88 year old female with history of GERD presenting with acute onset of chest pain and left hemiparesis. Patient had chest pain 1 week ago, was seen in urgent care and diagnosed with acid reflux. This morning woke up with weakness in the left face and arm; denies any headache, vision changes or aphasia. Patient also unable to walk at the bedside. Last seen normal at bedtime last night at 2200; woke up with deficits this morning. At baseline patient is healthy and very independent, lives alone and cares for herself, drives herself, mRS = 0.   Past Medical History:      There is no history of Hypertension      There is no history of Diabetes Mellitus      There is no history of Atrial Fibrillation      There is no history of Stroke  Medications:  No Anticoagulant use  No Antiplatelet use Reviewed EMR for current medications  Allergies:  Reviewed  Social History: Smoking: No  Family History:  There is no family history of premature cerebrovascular disease  pertinent to this consultation  ROS : 14 Points Review of Systems was performed and was negative  except mentioned in HPI.  Past Surgical History: There Is No Surgical History Contributory To Today's Visit   Examination: BP(175/89), Pulse(78), Blood Glucose(123) 1A: Level of Consciousness - Alert; keenly responsive + 0 1B: Ask Month and Age - Both Questions Right + 0 1C: Blink Eyes & Squeeze Hands - Performs Both Tasks + 0 2: Test Horizontal Extraocular Movements - Partial Gaze Palsy: Can Be Overcome + 1 3: Test Visual Fields - No Visual Loss + 0 4: Test Facial Palsy (Use Grimace if Obtunded) - Partial paralysis (lower face) + 2 5A: Test Left Arm Motor Drift - Drift, but doesn't hit bed + 1 5B: Test Right Arm Motor Drift - No Drift for 10 Seconds + 0 6A: Test Left Leg Motor Drift - Drift, but doesn't hit bed + 1 6B: Test Right Leg Motor Drift - No Drift for 5 Seconds + 0 7: Test Limb Ataxia (FNF/Heel-Shin) - No Ataxia + 0 8: Test Sensation - Normal; No sensory loss + 0 9: Test Language/Aphasia - Normal; No aphasia + 0 10: Test Dysarthria - Mild-Moderate Dysarthria: Slurring but can be understood + 1 11: Test Extinction/Inattention - No abnormality + 0  NIHSS Score: 6 NIHSS Free Text : left wrist drop and hand weakness in addition to drift, left nasolabial flattening, unable to walk at the bedside  Spoke with : Dr. Betsey Holiday    Patient / Family was informed the Neurology Consult would occur via TeleHealth consult by way of interactive audio and video telecommunications and consented to receiving care in this manner.  Patient is being evaluated for possible acute neurologic impairment and high probability of imminent or life - threatening deterioration.I spent total of 80 minutes providing care to this patient, including time for face to face visit via telemedicine, review of medical records, imaging studies and discussion of findings with providers, the patient and / or  family.   Dr. Heron Sabins ABPN Neurology, Vascular Neurology  [facility] For Inpatient follow-up with [facility] physician please call [facility] [phone]. This is not an outpatient service. Post hospital discharge, please contact hospital directly.

## 2022-08-26 NOTE — Progress Notes (Signed)
  Echocardiogram 2D Echocardiogram has been performed.  Sheila Jordan 08/26/2022, 3:01 PM

## 2022-08-26 NOTE — Anesthesia Preprocedure Evaluation (Signed)
Anesthesia Evaluation  Patient identified by MRN, date of birth, ID band Patient confused  Preop documentation limited or incomplete due to emergent nature of procedure.  Airway Mallampati: Unable to assess       Dental   Pulmonary shortness of breath   breath sounds clear to auscultation       Cardiovascular hypertension, + dysrhythmias Atrial Fibrillation  Rhythm:irregular Rate:Tachycardia  Pt is currently in AF with RVR.   Neuro/Psych CVA    GI/Hepatic   Endo/Other    Renal/GU      Musculoskeletal  (+) Arthritis ,    Abdominal   Peds  Hematology   Anesthesia Other Findings   Reproductive/Obstetrics                             Anesthesia Physical Anesthesia Plan  ASA: 4 and emergent  Anesthesia Plan: General   Post-op Pain Management:    Induction: Intravenous  PONV Risk Score and Plan: 3 and Ondansetron, Dexamethasone and Treatment may vary due to age or medical condition  Airway Management Planned: Oral ETT  Additional Equipment:   Intra-op Plan:   Post-operative Plan: Post-operative intubation/ventilation  Informed Consent:      Only emergency history available  Plan Discussed with: CRNA, Anesthesiologist and Surgeon  Anesthesia Plan Comments:        Anesthesia Quick Evaluation

## 2022-08-26 NOTE — Progress Notes (Signed)
Patient transported from PACU to 3M11 without complications. RN at bedside.

## 2022-08-26 NOTE — Consult Note (Addendum)
NAME:  Sheila Jordan, MRN:  [id number], DOB:  [birth year], LOS: 0 ADMISSION DATE:  08/26/2022, CONSULTATION DATE:  11/20 REFERRING MD: Dr. Amada Jupiter, CHIEF COMPLAINT: Stroke  History of Present Illness:  88 year old female with past medical history as below, which is significant for hypertension.  At baseline she is independent and lives alone.  Last known well 10 PM on 11/19.  She woke up at 4 AM with difficulty moving her left side.  She presented to [facility] emergency department on 11/20 with ongoing complaints of left-sided weakness.  Brain imaging in the emergency department demonstrated M2 occlusion.  She was emergently transferred to [facility] for neuro IR intervention.  Upon arrival and laying flat she developed shortness of breath and respiratory distress.  She was intubated and underwent mechanical thrombectomy with a TICI 3 result.  Due to respiratory distress prior to initially being intubated she remained intubated after the procedure and transferred to the ICU for ongoing care.  PCCM consulted for ventilator management.  Pertinent  Medical History   has a past medical history of Arthritis and Hypertension.   Significant Hospital Events: Including procedures, antibiotic start and stop dates in addition to other pertinent events     Interim History / Subjective:    Objective   Blood pressure 138/62, pulse 68, temperature 97.7 F (36.5 C), resp. rate 18, height 5\' 4"  (1.626 m), weight 76 kg, SpO2 100 %.    Vent Mode: PRVC FiO2 (%):  [100 %] 100 % Set Rate:  [18 bmp] 18 bmp Vt Set:  [430 mL] 430 mL PEEP:  [5 cmH20] 5 cmH20 Plateau Pressure:  [17 cmH20] 17 cmH20   Intake/Output Summary (Last 24 hours) at 08/26/2022 1143 Last data filed at 08/26/2022 1009 Gross per 24 hour  Intake 600 ml  Output --  Net 600 ml   Filed Weights   08/26/22 08/28/22  Weight: 76 kg    Examination: General: Elderly appearing female with normal body habitus HENT:  Normocephalic, atraumatic, pupils pinpoint Lungs: Clear bilateral breath sounds Cardiovascular: Regular rate and rhythm Abdomen: Soft, nondistended, hypoactive bowel sounds Extremities: No acute deformity.  Normal bulk and tone Neuro: Sedated, but will nod head appropriately.    Resolved Hospital Problem list     Assessment & Plan:   CVA: Right M2/MCA occlusion.  Outside the window for systemic thrombolytics but did undergo mechanical thrombectomy by interventional radiology 11/20. -Management per neurology -MRI pending -Frequent neuro checks -Echocardiogram pending -PT/OT/SLP once extubated -Keep SBP 120-140 > starting Clevidipine  Acute hypoxemic respiratory failure: Did develop some respiratory stress wound being laid supine prior to thrombectomy. History sounds like pulmonary edema, which CXR is also consistent with. All this in the setting of stroke Remains intubated postoperatively. -Full vent support -ABG -Chest x-ray for tube placement -We will attempt to wake up and trial of SBT once we get BP within range. -VAP bundle -Lasix one time dose with potassium  Atrial fibrillation: No history. Paroxysmal here in ICU. Worsening as we wean sedation. Certainly possible she has had PAF for some time considering her presentation with acute stroke.  - Telemetry monitoring and ongoing review.  - ECG now - Echo pending - Amiodarone bolus + drip considering relative hypotension - Anticoagulation per primary   Best Practice (right click and "Reselect all SmartList Selections" daily)   Diet/type: NPO DVT prophylaxis: other per primary GI prophylaxis: H2B Lines: N/A Foley:  N/A Code Status:  full code Last date of multidisciplinary goals of  care discussion [ Family updated at bedside 11/20 ]  Labs   CBC: Recent Labs  Lab 08/26/22 0609 08/26/22 0614  WBC 6.9  --   NEUTROABS 5.2  --   HGB 12.4 13.3  HCT 37.1 39.0  MCV 89.8  --   PLT 194  --     Basic Metabolic  Panel: Recent Labs  Lab 08/26/22 0609 08/26/22 0614  NA 140 142  K 3.7 3.7  CL 112* 109  CO2 21*  --   GLUCOSE 122* 120*  BUN 17 17  CREATININE 1.01* 1.00  CALCIUM 8.6*  --    GFR: Estimated Creatinine Clearance: 35.8 mL/min (by C-G formula based on SCr of 1 mg/dL). Recent Labs  Lab 08/26/22 0609  WBC 6.9    Liver Function Tests: Recent Labs  Lab 08/26/22 0609  AST 38  ALT 38  ALKPHOS 104  BILITOT 1.1  PROT 7.0  ALBUMIN 4.2   No results for input(s): "LIPASE", "AMYLASE" in the last 168 hours. No results for input(s): "AMMONIA" in the last 168 hours.  ABG    Component Value Date/Time   TCO2 21 (L) 08/26/2022 0614     Coagulation Profile: Recent Labs  Lab 08/26/22 0609  INR 1.1    Cardiac Enzymes: No results for input(s): "CKTOTAL", "CKMB", "CKMBINDEX", "TROPONINI" in the last 168 hours.  HbA1C: No results found for: "HGBA1C"  CBG: Recent Labs  Lab 08/26/22 0609  GLUCAP 123*    Review of Systems:   Patient is encephalopathic and/or intubated. Therefore history has been obtained from chart review.    Past Medical History:  She,  has a past medical history of Arthritis and Hypertension.   Surgical History:   Past Surgical History:  Procedure Laterality Date   APPENDECTOMY     CHOLECYSTECTOMY     REVERSE SHOULDER ARTHROPLASTY Right 06/19/2020   Procedure: REVERSE TOTAL SHOULDER ARTHROPLASTY;  Surgeon: Hiram Gash, MD;  Location: [place];  Service: Orthopedics;  Laterality: Right;     Social History:   reports that she has never smoked. She has never used smokeless tobacco. She reports that she does not drink alcohol and does not use drugs.   Family History:  Her family history includes Breast cancer in her daughter; Other in her father; Parkinson's disease in her mother.   Allergies No Known Allergies   Home Medications  Prior to Admission medications   Medication Sig Start Date End Date Taking? Authorizing Provider  alum & mag  hydroxide-simeth (MYLANTA MAXIMUM STRENGTH) 400-400-40 MG/5ML suspension Take 15 mLs by mouth every 6 (six) hours as needed for indigestion. 08/21/22   Leath-Warren, Alda Lea, NP  ferrous sulfate 325 (65 FE) MG EC tablet Take 1 tablet (325 mg total) by mouth 2 (two) times daily. 06/21/20 08/20/20  Antonieta Pert, MD  nystatin-triamcinolone ointment (MYCOLOG) Apply 1 application topically 2 (two) times daily. To affected area. 08/02/19   Jonnie Kind, MD  omeprazole (PRILOSEC) 20 MG capsule Take 1 capsule (20 mg total) by mouth daily. 08/21/22   Leath-Warren, Alda Lea, NP  Vitamin D, Ergocalciferol, (DRISDOL) 1.25 MG (50000 UNIT) CAPS capsule Take 50,000 Units by mouth once a week. 05/19/20   [provider]     Critical care time: 49 minutes     Georgann Housekeeper, AGACNP-BC Echo Pulmonary & Critical Care  See Amion for personal pager PCCM on call pager [phone] until 7pm. Please call Elink 7p-7a. [phone]  08/26/2022 12:12 PM

## 2022-08-27 ENCOUNTER — Encounter (HOSPITAL_COMMUNITY): Payer: Self-pay | Admitting: Radiology

## 2022-08-27 ENCOUNTER — Other Ambulatory Visit (HOSPITAL_COMMUNITY): Payer: Self-pay

## 2022-08-27 DIAGNOSIS — I63411 Cerebral infarction due to embolism of right middle cerebral artery: Secondary | ICD-10-CM | POA: Diagnosis not present

## 2022-08-27 LAB — CBC WITH DIFFERENTIAL/PLATELET
Abs Immature Granulocytes: 0.03 10*3/uL (ref 0.00–0.07)
Basophils Absolute: 0 10*3/uL (ref 0.0–0.1)
Basophils Relative: 0 %
Eosinophils Absolute: 0 10*3/uL (ref 0.0–0.5)
Eosinophils Relative: 0 %
HCT: 35.1 % — ABNORMAL LOW (ref 36.0–46.0)
Hemoglobin: 11.8 g/dL — ABNORMAL LOW (ref 12.0–15.0)
Immature Granulocytes: 0 %
Lymphocytes Relative: 13 %
Lymphs Abs: 0.9 10*3/uL (ref 0.7–4.0)
MCH: 30.4 pg (ref 26.0–34.0)
MCHC: 33.6 g/dL (ref 30.0–36.0)
MCV: 90.5 fL (ref 80.0–100.0)
Monocytes Absolute: 0.7 10*3/uL (ref 0.1–1.0)
Monocytes Relative: 9 %
Neutro Abs: 5.6 10*3/uL (ref 1.7–7.7)
Neutrophils Relative %: 78 %
Platelets: 161 10*3/uL (ref 150–400)
RBC: 3.88 MIL/uL (ref 3.87–5.11)
RDW: 14.4 % (ref 11.5–15.5)
WBC: 7.3 10*3/uL (ref 4.0–10.5)
nRBC: 0 % (ref 0.0–0.2)

## 2022-08-27 LAB — GLUCOSE, CAPILLARY
Glucose-Capillary: 98 mg/dL (ref 70–99)
Glucose-Capillary: 97 mg/dL (ref 70–99)
Glucose-Capillary: 108 mg/dL — ABNORMAL HIGH (ref 70–99)
Glucose-Capillary: 162 mg/dL — ABNORMAL HIGH (ref 70–99)
Glucose-Capillary: 118 mg/dL — ABNORMAL HIGH (ref 70–99)

## 2022-08-27 LAB — COMPREHENSIVE METABOLIC PANEL
ALT: 47 U/L — ABNORMAL HIGH (ref 0–44)
AST: 37 U/L (ref 15–41)
Albumin: 3.6 g/dL (ref 3.5–5.0)
Alkaline Phosphatase: 113 U/L (ref 38–126)
Anion gap: 12 (ref 5–15)
BUN: 13 mg/dL (ref 8–23)
CO2: 20 mmol/L — ABNORMAL LOW (ref 22–32)
Calcium: 8.5 mg/dL — ABNORMAL LOW (ref 8.9–10.3)
Chloride: 108 mmol/L (ref 98–111)
Creatinine, Ser: 1.14 mg/dL — ABNORMAL HIGH (ref 0.44–1.00)
GFR, Estimated: 45 mL/min — ABNORMAL LOW (ref >60)
Glucose, Bld: 118 mg/dL — ABNORMAL HIGH (ref 70–99)
Potassium: 3.7 mmol/L (ref 3.5–5.1)
Sodium: 140 mmol/L (ref 135–145)
Total Bilirubin: 0.5 mg/dL (ref 0.3–1.2)
Total Protein: 6 g/dL — ABNORMAL LOW (ref 6.5–8.1)

## 2022-08-27 LAB — LIPID PANEL
Cholesterol: 139 mg/dL (ref 0–200)
HDL: 59 mg/dL (ref >40)
LDL Cholesterol: 67 mg/dL (ref 0–99)
Total CHOL/HDL Ratio: 2.4 RATIO
Triglycerides: 67 mg/dL (ref <150)
VLDL: 13 mg/dL (ref 0–40)

## 2022-08-27 LAB — HEMOGLOBIN A1C
Hgb A1c MFr Bld: 5.6 % (ref 4.8–5.6)
Mean Plasma Glucose: 114.02 mg/dL

## 2022-08-27 LAB — MAGNESIUM: Magnesium: 2.4 mg/dL (ref 1.7–2.4)

## 2022-08-27 MED ORDER — METOPROLOL TARTRATE 5 MG/5ML IV SOLN
5.0000 mg | Freq: Once | INTRAVENOUS | Status: AC
  Administered 2022-08-27: 5 mg via INTRAVENOUS

## 2022-08-27 MED ORDER — POTASSIUM CHLORIDE 20 MEQ PO PACK: 40.0000 meq | PACK | Freq: Once | ORAL | Status: DC

## 2022-08-27 MED ORDER — METOPROLOL TARTRATE 5 MG/5ML IV SOLN
INTRAVENOUS | Status: AC
  Filled 2022-08-27: qty 5

## 2022-08-27 MED ORDER — METOPROLOL TARTRATE 12.5 MG HALF TABLET
12.5000 mg | ORAL_TABLET | Freq: Two times a day (BID) | ORAL | Status: DC
  Administered 2022-08-27: 12.5 mg via ORAL
  Filled 2022-08-27: qty 1

## 2022-08-27 MED ORDER — POTASSIUM CHLORIDE 10 MEQ/100ML IV SOLN
10.0000 meq | INTRAVENOUS | Status: AC
  Administered 2022-08-27 (×4): 10 meq via INTRAVENOUS
  Filled 2022-08-27 (×4): qty 100

## 2022-08-27 MED ORDER — METOPROLOL TARTRATE 5 MG/5ML IV SOLN
5.0000 mg | Freq: Once | INTRAVENOUS | Status: AC
  Administered 2022-08-27: 5 mg via INTRAVENOUS
  Filled 2022-08-27: qty 5

## 2022-08-27 MED ORDER — METOPROLOL TARTRATE 12.5 MG HALF TABLET
12.5000 mg | ORAL_TABLET | Freq: Four times a day (QID) | ORAL | Status: DC
  Administered 2022-08-27 – 2022-08-28 (×2): 12.5 mg via ORAL
  Filled 2022-08-27 (×2): qty 1

## 2022-08-27 MED ORDER — APIXABAN 5 MG PO TABS
5.0000 mg | ORAL_TABLET | Freq: Two times a day (BID) | ORAL | Status: DC
  Administered 2022-08-27 – 2022-08-29 (×5): 5 mg via ORAL
  Filled 2022-08-27 (×5): qty 1

## 2022-08-27 MED ORDER — ORAL CARE MOUTH RINSE: 15.0000 mL | OROMUCOSAL | Status: DC | PRN

## 2022-08-27 MED ORDER — ASPIRIN 81 MG PO TBEC
81.0000 mg | DELAYED_RELEASE_TABLET | Freq: Every day | ORAL | Status: DC
  Administered 2022-08-28 – 2022-08-29 (×2): 81 mg via ORAL
  Filled 2022-08-27 (×2): qty 1

## 2022-08-27 NOTE — Discharge Instructions (Signed)

## 2022-08-27 NOTE — Evaluation (Signed)
Speech Language Pathology Evaluation Patient Details Name: ALBERTIA CARVIN MRN: [id number] DOB: July 10, 1928 Today's Date: 08/27/2022 Time: 0630-1601 SLP Time Calculation (min) (ACUTE ONLY): 18 min  Problem List:  Patient Active Problem List   Diagnosis Date Noted   Stroke (cerebrum) (HCC) 08/26/2022   Acute hypoxemic respiratory failure (HCC) 08/26/2022   Atrial fibrillation with RVR (HCC) 08/26/2022   Acute pulmonary edema (HCC) 08/26/2022   Pelvic fracture (HCC) 06/16/2020   HTN (hypertension) 06/15/2020   Inferior pubic ramus fracture, right, closed, initial encounter (HCC) 06/15/2020   Sacral fracture (HCC) 06/15/2020   Humeral head fracture, right, closed, initial encounter 06/15/2020   Past Medical History:  Past Medical History:  Diagnosis Date   Arthritis    Hypertension    Past Surgical History:  Past Surgical History:  Procedure Laterality Date   APPENDECTOMY     CHOLECYSTECTOMY     IR CT HEAD LTD  08/26/2022   IR PERCUTANEOUS ART THROMBECTOMY/INFUSION INTRACRANIAL INC DIAG ANGIO  08/26/2022   IR US GUIDE VASC ACCESS RIGHT  08/26/2022   RADIOLOGY WITH ANESTHESIA N/A 08/26/2022   Procedure: RADIOLOGY WITH ANESTHESIA;  Surgeon: Radiologist, Medication, MD;  Location: [facility] OR;  Service: Radiology;  Laterality: N/A;   REVERSE SHOULDER ARTHROPLASTY Right 06/19/2020   Procedure: REVERSE TOTAL SHOULDER ARTHROPLASTY;  Surgeon: Bjorn Pippin, MD;  Location: [facility] OR;  Service: Orthopedics;  Laterality: Right;   HPI:  Pt is a 88 y.o. female who presented to [facility] 11/20 with SOB and L side weakness. CTA revealed R MCA occlusion. MRI revealed multiple small acute infarcts in the right insula and overlying right frontal lobe.  She was transferred to [facility] and underwent thrombectomy. She remained intubated following procedure due to a fib with RVR. Extubated 11/21. PMH: HTN, OA, h/o R tib/fib fx with IM nail (1995), h/o R prox humeral fx s/p sx repair and pelvic ring injury (2021)    Assessment / Plan / Recommendation Clinical Impression  Patient is not presenting with any cognitive or linguistic impairments as per this evaluation. She is fully oriented, able to recall and accurately describe recent therapeutic and medical interventions and shows good awareness, attention and understanding. She lives alone but has family nearby and is not used to taking any medications; SLP recommended she be careful and get help if needed with reading prescription bottle, etc as she will be taking medications for her heart from now on. From a cognitive standpoint, she appears safe to return to [facility] upon discharge from hospital.    SLP Assessment  SLP Recommendation/Assessment: Patient does not need any further Speech Lanaguage Pathology Services    Recommendations for follow up therapy are one component of a multi-disciplinary discharge planning process, led by the attending physician.  Recommendations may be updated based on patient status, additional functional criteria and insurance authorization.    Follow Up Recommendations  No SLP follow up    Assistance Recommended at Discharge  None  Functional Status Assessment Patient has not had a recent decline in their functional status  Frequency and Duration   N/A        SLP Evaluation Cognition  Overall Cognitive Status: Within Functional Limits for tasks assessed Orientation Level: Oriented X4 Year: 2023 Month: November Day of Week: Correct Memory: Appears intact Awareness: Appears intact Problem Solving: Appears intact Safety/Judgment: Appears intact       Comprehension  Auditory Comprehension Overall Auditory Comprehension: Appears within functional limits for tasks assessed    Expression Expression Primary Mode of  Expression: Verbal Verbal Expression Overall Verbal Expression: Appears within functional limits for tasks assessed   Oral / Motor  Oral Motor/Sensory Function Overall Oral Motor/Sensory Function:  Within functional limits Motor Speech Overall Motor Speech: Appears within functional limits for tasks assessed Respiration: Within functional limits Resonance: Within functional limits Articulation: Within functional limitis Intelligibility: Intelligible Motor Planning: Witnin functional limits           Sonia Baller, MA, CCC-SLP Speech Therapy

## 2022-08-27 NOTE — Progress Notes (Signed)
PT Cancellation Note  Patient Details Name: Sheila Jordan MRN: [id number] DOB: 1928/08/17   Cancelled Treatment:    Reason Eval/Treat Not Completed: Patient not medically ready. Plans to extubate this AM. PT to re-attempt as time allows.   Ilda Foil 08/27/2022, 9:00 AM

## 2022-08-27 NOTE — TOC Initial Note (Signed)
Transition of Care [facility]) - Initial/Assessment Note    Patient Details  Name: Sheila Jordan MRN: [id number] Date of Birth: 1928/09/23  Transition of [facility] CM/SW Contact:    Tom-Johnson, Hershal Coria, RN Phone Number: 08/27/2022, 12:47 PM  Clinical Narrative:                  CM spoke with patient, son Sheila Jordan and daughter Sheila Jordan at bedside about needs for post hospital transition. Admitted for Rt MCA Stroke/M2 branch occlusion. Had Thrombectomy on 08/2022. Was intubated, Extubated today and currently on 2L O2. Has Left sided weakness. Patient states she lives alone, has three living supportive children. Independent with care and drive self. Very active in the community and [address]. Teaches exercise classes on Fridays. Does not use DME's, has a shower seat from prior hospitalization.  Home health referral called in to [facility] per patient's request as she used their services last year and Sheila Jordan voiced acceptance, info on AVS.  PCP is Carylon Perches, MD and uses [facility]. CM will continue to follow as patient progresses towards discharge.         Expected Discharge Plan: Home w Home Health Services Barriers to Discharge: Continued Medical Work up   Patient Goals and CMS Choice Patient states their goals for this hospitalization and ongoing recovery are:: To rerturn home CMS Medicare.gov Compare Post Acute Care list provided to:: Patient Choice offered to / list presented to : Patient, Adult Children (Son, Ron and Daughter, Sheila Jordan)  Expected Discharge Plan and Services Expected Discharge Plan: Home w Home Health Services   Discharge Planning Services: CM Consult Post Acute Care Choice: Home Health Living arrangements for the past 2 months: Single Family Home                 DME Arranged: N/A DME Agency: NA       HH Arranged: PT, OT HH Agency: [facility] Date HH Agency Contacted: 08/27/22 Time HH Agency Contacted: 1217 Representative spoke  with at [facility] Agency: Sheila Jordan  Prior Living Arrangements/Services Living arrangements for the past 2 months: Single Family Home Lives with:: Self Patient language and need for interpreter reviewed:: Yes Do you feel safe going back to the place where you live?: Yes      Need for Family Participation in Patient Care: Yes (Comment) Care giver support system in place?: Yes (comment) Current home services: DME (Shower seat) Criminal Activity/Legal Involvement Pertinent to Current Situation/Hospitalization: No - Comment as needed  Activities of Daily Living      Permission Sought/Granted Permission sought to share information with : Case Manager, Magazine features editor, Family Supports Permission granted to share information with : Yes, Verbal Permission Granted              Emotional Assessment Appearance:: Appears stated age Attitude/Demeanor/Rapport: Engaged, Gracious Affect (typically observed): Accepting, Appropriate, Calm, Hopeful, Pleasant Orientation: : Oriented to Self, Oriented to Place, Oriented to  Time, Oriented to Situation Alcohol / Substance Use: Not Applicable Psych Involvement: No (comment)  Admission diagnosis:  Stroke (cerebrum) (HCC) [I63.9] Cerebrovascular accident (CVA), unspecified mechanism (HCC) [I63.9] Patient Active Problem List   Diagnosis Date Noted   Stroke (cerebrum) (HCC) 08/26/2022   Acute hypoxemic respiratory failure (HCC) 08/26/2022   Atrial fibrillation with RVR (HCC) 08/26/2022   Acute pulmonary edema (HCC) 08/26/2022   Pelvic fracture (HCC) 06/16/2020   HTN (hypertension) 06/15/2020   Inferior pubic ramus fracture, right, closed, initial encounter (HCC) 06/15/2020  Sacral fracture ([place]) 06/15/2020   Humeral head fracture, right, closed, initial encounter 06/15/2020   PCP:  Asencion Noble, MD Pharmacy:   [facility] [place] [id number] [place] [id number] Phone: [phone]  Fax: [place] [address] [place] [address] Phone: [phone] Fax: [phone]     Social Determinants of Health (SDOH) Interventions Food Insecurity Interventions: Intervention Not Indicated Housing Interventions: Intervention Not Indicated Transportation Interventions: Inpatient TOC, Patient Resources (Friends/Family) Utilities Interventions: Intervention Not Indicated  Readmission Risk Interventions     No data to display

## 2022-08-27 NOTE — Progress Notes (Addendum)
Critical care attending attestation note:   Patient seen and examined and relevant ancillary tests reviewed.  I agree with the assessment and plan of care as outlined by Georgann Housekeeper, NP.    88 year old woman woke up this morning with difficulty bending left side, presented to [facility] and found to have M2 occlusion transferred to [facility] status post neuro IR intervention with thrombectomy subsequently remained intubated after procedure now in the ICU.   Alert and awake this morning.  Off sedation.  Passing SBT.  Subsequent extubated.  Doing well.  Lives all extremities off the bed with ease bilaterally.  Facial symmetry intact.  Voice a bit hoarse after ET tube placement.   Synopsis of assessment and plan:   Acute stroke: With left-sided weakness, M2 distribution on right. -- Neurology primary, appreciate assistance -- Status post neuro interventional radiology thrombectomy, no TNK, no stent   Postoperative ventilator management: Has not extubate due to A-fib with RVR, relative instability.  Now improved with interventions below. -- Extubated 08/27/2022 AM to nasal cannula   Atrial fibrillation with RVR: Appears new.  Perioperatively and then postoperatively in the ICU.  Converted to sinus rhythm with Amio bolus and shortly after starting amnioinfusion. -- Continue amiodarone  --Heart rates improved with IV metoprolol, if passes swallow would transition to oral metoprolol and off amiodarone -- CHADSVASC2 score 5, HASBLED 1 - defer AC for now given recent CVA   Anticipate transfer out of the ICU, PCCM will sign off.   CRITICAL CARE Performed by: Bonna Gains Aubrie Lucien     Total critical care time: 32 minutes   Critical care time was exclusive of separately billable procedures and treating other patients.   Critical care was necessary to treat or prevent imminent or life-threatening deterioration.   Critical care was time spent personally by me on the following activities:  development of treatment plan with patient and/or surrogate as well as nursing, discussions with consultants, evaluation of patient's response to treatment, examination of patient, obtaining history from patient or surrogate, ordering and performing treatments and interventions, ordering and review of laboratory studies, ordering and review of radiographic studies, pulse oximetry, re-evaluation of patient's condition and participation in multidisciplinary rounds.   Lanier Clam, MD ICU Physician [place] for contact info   08/27/22, 10:25 AM     NAME:  Sheila Jordan, MRN:  [id number], DOB:  [birth year], LOS: 1 ADMISSION DATE:  08/26/2022, CONSULTATION DATE:  11/20 REFERRING MD: Dr. Leonel Ramsay, CHIEF COMPLAINT: Stroke  History of Present Illness:  88 year old female with past medical history as below, which is significant for hypertension.  At baseline she is independent and lives alone.  Last known well 10 PM on 11/19.  She woke up at 4 AM with difficulty moving her left side.  She presented to [facility] emergency department on 11/20 with ongoing complaints of left-sided weakness.  Brain imaging in the emergency department demonstrated M2 occlusion.  She was emergently transferred to [facility] for neuro IR intervention.  Upon arrival and laying flat she developed shortness of breath and respiratory distress.  She was intubated and underwent mechanical thrombectomy with a TICI 3 result.  Due to respiratory distress prior to initially being intubated she remained intubated after the procedure and transferred to the ICU for ongoing care.  PCCM consulted for ventilator management.  Pertinent  Medical History   has a past medical history of Arthritis and Hypertension.   Significant Hospital Events: Including  procedures, antibiotic start and stop dates in addition to other pertinent events   08/27/2022 extubating  Interim History / Subjective:  We  will extubate as tolerated  Objective   Blood pressure (!) 132/97, pulse (!) 156, temperature 98.4 F (36.9 C), temperature source Axillary, resp. rate 18, height 5\' 4"  (1.626 m), weight 76 kg, SpO2 100 %.    Vent Mode: PSV;CPAP FiO2 (%):  [40 %-100 %] 40 % Set Rate:  [16 bmp-18 bmp] 16 bmp Vt Set:  [430 mL] 430 mL PEEP:  [5 cmH20] 5 cmH20 Pressure Support:  [5 cmH20] 5 cmH20 Plateau Pressure:  [11 cmH20-17 cmH20] 16 cmH20   Intake/Output Summary (Last 24 hours) at 08/27/2022 [id number] Last data filed at 08/27/2022 0800 Gross per 24 hour  Intake 2325.06 ml  Output 60 ml  Net 2265.06 ml   Filed Weights   08/26/22 [id number]  Weight: 76 kg    Examination: General: Well-nourished female no acute distress HEENT: MM pink/moist endotracheal is tube is in place no NG tube or OG tube Neuro: Pressly intact moves all extremities follows commands  CV: Heart sounds are regular ventricular rate of 122 PULM: Decreased breath sounds in the bases Vent pressure support 5/5 FIO2 40% PEEP 5 RATE VT 460 can move 700 with attempts  GI: soft, bsx4 active  GU: Amber urine Extremities: warm/dry, 1+ edema  Skin: no rashes or lesions    Resolved Hospital Problem list     Assessment & Plan:   CVA: Right M2/MCA occlusion.  Outside the window for systemic thrombolytics but did undergo mechanical thrombectomy by interventional radiology 11/20. Per neurology Neurochecks Maintain systolic blood pressure 1 20-1 40   Acute hypoxemic respiratory failure: Did develop some respiratory stress wound being laid supine prior to thrombectomy. History sounds like pulmonary edema, which CXR is also consistent with. All this in the setting of stroke Remains intubated postoperatively. Currently tolerating pressure support ventilation We will extubate BiPAP is option if needed  Atrial fibrillation: No history. Paroxysmal here in ICU. Worsening as we wean sedation. Certainly possible she has had PAF for some time  considering her presentation with acute stroke.  Continue to monitor on telemetry 2D echo reveals EF of 55% Low-dose Metroprolol Currently on amiodarone drip Anticoagulation per primary     Best Practice (right click and "Reselect all SmartList Selections" daily)   Diet/type: NPO DVT prophylaxis: other per primary GI prophylaxis: H2B Lines: N/A Foley:  N/A Code Status:  full code Last date of multidisciplinary goals of care discussion [ Family updated at bedside 11/20 ]  Labs   CBC: Recent Labs  Lab 08/26/22 0609 08/26/22 0614 08/26/22 1813 08/27/22 0343  WBC 6.9  --   --  7.3  NEUTROABS 5.2  --   --  5.6  HGB 12.4 13.3 11.2* 11.8*  HCT 37.1 39.0 33.0* 35.1*  MCV 89.8  --   --  90.5  PLT 194  --   --  161    Basic Metabolic Panel: Recent Labs  Lab 08/26/22 0609 08/26/22 0614 08/26/22 1813 08/26/22 2043 08/27/22 0343  NA 140 142 140 142 140  K 3.7 3.7 3.7 3.8 3.7  CL 112* 109  --  115* 108  CO2 21*  --   --  19* 20*  GLUCOSE 122* 120*  --  121* 118*  BUN 17 17  --  12 13  CREATININE 1.01* 1.00  --  0.94 1.14*  CALCIUM 8.6*  --   --  7.2*  8.5*  MG  --   --   --  1.7 2.4   GFR: Estimated Creatinine Clearance: 31.4 mL/min (A) (by C-G formula based on SCr of 1.14 mg/dL (H)). Recent Labs  Lab 08/26/22 0609 08/27/22 0343  WBC 6.9 7.3    Liver Function Tests: Recent Labs  Lab 08/26/22 0609 08/26/22 2043 08/27/22 0343  AST 38 38 37  ALT 38 43 47*  ALKPHOS 104 98 113  BILITOT 1.1 0.5 0.5  PROT 7.0 5.1* 6.0*  ALBUMIN 4.2 3.1* 3.6   No results for input(s): "LIPASE", "AMYLASE" in the last 168 hours. No results for input(s): "AMMONIA" in the last 168 hours.  ABG    Component Value Date/Time   PHART 7.377 08/26/2022 1813   PCO2ART 31.9 (L) 08/26/2022 1813   PO2ART 71 (L) 08/26/2022 1813   HCO3 18.7 (L) 08/26/2022 1813   TCO2 20 (L) 08/26/2022 1813   ACIDBASEDEF 6.0 (H) 08/26/2022 1813   O2SAT 94 08/26/2022 1813     Coagulation  Profile: Recent Labs  Lab 08/26/22 0609  INR 1.1    Cardiac Enzymes: No results for input(s): "CKTOTAL", "CKMB", "CKMBINDEX", "TROPONINI" in the last 168 hours.  HbA1C: Hgb A1c MFr Bld  Date/Time Value Ref Range Status  08/27/2022 03:43 AM 5.6 4.8 - 5.6 % Final    Comment:    (NOTE) Pre diabetes:          5.7%-6.4%  Diabetes:              >6.4%  Glycemic control for   <7.0% adults with diabetes     CBG: Recent Labs  Lab 08/26/22 1514 08/26/22 1946 08/26/22 2332 08/27/22 0328 08/27/22 0811  GLUCAP 132* 134* 117* 98 97    Critical care time: 34  minutes    Brett Canales Minor ACNP Acute Care Nurse Practitioner [facility] Pulmonary/Critical Care Please consult Amion 08/27/2022, 9:11 AM

## 2022-08-27 NOTE — Evaluation (Addendum)
Physical Therapy Evaluation Patient Details Name: Sheila Jordan MRN: [id number] DOB: [birth year] Today's Date: 08/27/2022  History of Present Illness  Pt is a 88 y.o. female who presented to [facility] 11/20 with SOB and L side weakness. CTA revealed R MCA occlusion. MRI revealed multiple small acute infarcts in the right insula and overlying right frontal lobe.  She was transferred to [facility] and underwent thrombectomy. She remained intubated following procedure due to a fib with RVR. Extubated 11/21. PMH: HTN, OA, h/o R tib/fib fx with IM nail (1995), h/o R prox humeral fx s/p sx repair and pelvic ring injury (2021)   Clinical Impression  Pt admitted with above diagnosis. PTA pt lived at home alone, independent and active. Pt currently with functional limitations due to the deficits listed below (see PT Problem List). On eval, she required min assist bed mobility, mod assist sit to stand, and min assist sidestepping bedside with BUE support. Pt just extubated this AM. VSS on 2L. BLE strength intact and symmetrical. Pt will benefit from skilled PT to increase their independence and safety with mobility to allow discharge to the venue listed below.  Suspect pt will progress quickly with mobility. Gait deferred this session due to just being extubated and fatigue noted with bedside standing.         Recommendations for follow up therapy are one component of a multi-disciplinary discharge planning process, led by the attending physician.  Recommendations may be updated based on patient status, additional functional criteria and insurance authorization.  Follow Up Recommendations Home health PT      Assistance Recommended at Discharge Frequent or constant Supervision/Assistance  Patient can return home with the following  A little help with walking and/or transfers;A little help with bathing/dressing/bathroom;Help with stairs or ramp for entrance;Assistance with cooking/housework;Assist for  transportation    Equipment Recommendations None recommended by PT  Recommendations for Other Services       Functional Status Assessment Patient has had a recent decline in their functional status and demonstrates the ability to make significant improvements in function in a reasonable and predictable amount of time.     Precautions / Restrictions Precautions Precautions: Fall      Mobility  Bed Mobility Overal bed mobility: Needs Assistance Bed Mobility: Supine to Sit, Sit to Supine     Supine to sit: Min assist, HOB elevated Sit to supine: Min assist   General bed mobility comments: assist to elevate trunk, assist with BLE back to bed    Transfers Overall transfer level: Needs assistance Equipment used: 1 person hand held assist Transfers: Sit to/from Stand Sit to Stand: Mod assist           General transfer comment: face-to-face sit to stand with pt using therapist's bilat forearms for support, increased time to stabilize initial balance    Ambulation/Gait Ambulation/Gait assistance: Min assist Gait Distance (Feet): 3 Feet Assistive device: 1 person hand held assist         General Gait Details: sidestepping bedside, therapist anterior to pt, pt using therapist's bilat forearms for support  Stairs            Wheelchair Mobility    Modified Rankin (Stroke Patients Only) Modified Rankin (Stroke Patients Only) Pre-Morbid Rankin Score: No symptoms Modified Rankin: Moderately severe disability     Balance Overall balance assessment: Needs assistance Sitting-balance support: No upper extremity supported, Feet unsupported Sitting balance-Leahy Scale: Good     Standing balance support: Bilateral upper extremity supported, During functional activity  Standing balance-Leahy Scale: Poor Standing balance comment: reliant on external support                             Pertinent Vitals/Pain Pain Assessment Pain Assessment: No/denies  pain    Home Living Family/patient expects to be discharged to:: Private residence Living Arrangements: Alone Available Help at Discharge: Family Type of Home: House Home Access: Ramped entrance       Home Layout: Two level;Able to live on main level with bedroom/bathroom Home Equipment: Rolling Walker (2 wheels);Cane - single point;Wheelchair - manual;Grab bars - toilet;Grab bars - tub/shower Additional Comments: 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    Prior Function Prior Level of Function : Independent/Modified Independent;Driving             Mobility Comments: no AD for amb ADLs Comments: does her own grocery shopping, cooking, and housework     Hand Dominance   Dominant Hand: Right    Extremity/Trunk Assessment   Upper Extremity Assessment Upper Extremity Assessment: Defer to OT evaluation    Lower Extremity Assessment Lower Extremity Assessment: Overall WFL for tasks assessed (symmetrical)    Cervical / Trunk Assessment Cervical / Trunk Assessment: Kyphotic  Communication   Communication: No difficulties  Cognition Arousal/Alertness: Awake/alert Behavior During Therapy: WFL for tasks assessed/performed Overall Cognitive Status: Within Functional Limits for tasks assessed                                          General Comments General comments (skin integrity, edema, etc.): VSS on 2L. Just extubated this AM.    Exercises     Assessment/Plan    PT Assessment Patient needs continued PT services  PT Problem List Decreased balance;Decreased mobility;Decreased activity tolerance       PT Treatment Interventions DME instruction;Functional mobility training;Balance training;Patient/family education;Therapeutic activities;Gait training;Therapeutic exercise    PT Goals (Current goals can be found in the Care Plan section)  Acute Rehab  PT Goals Patient Stated Goal: home PT Goal Formulation: With patient Time For Goal Achievement: 09/10/22 Potential to Achieve Goals: Good    Frequency Min 4X/week     Co-evaluation               AM-PAC PT "6 Clicks" Mobility  Outcome Measure Help needed turning from your back to your side while in a flat bed without using bedrails?: A Little Help needed moving from lying on your back to sitting on the side of a flat bed without using bedrails?: A Little Help needed moving to and from a bed to a chair (including a wheelchair)?: A Little Help needed standing up from a chair using your arms (e.g., wheelchair or bedside chair)?: A Little Help needed to walk in hospital room?: A Lot Help needed climbing 3-5 steps with a railing? : A Lot 6 Click Score: 16    End of Session Equipment Utilized During Treatment: Gait belt;Oxygen Activity Tolerance: Patient tolerated treatment well Patient left: in bed;with call bell/phone within reach;with bed alarm set Nurse Communication: Mobility status PT Visit Diagnosis: Difficulty in walking, not elsewhere classified (R26.2)    Time: [id number] PT Time Calculation (min) (ACUTE ONLY): 23 min   Charges:   PT Evaluation $PT Eval Moderate Complexity: 1  Mod PT Treatments $Therapeutic Activity: 8-22 mins        Aida Raider, [place]  Office # [phone] Pager [phone]   Ilda Foil 08/27/2022, 12:16 PM

## 2022-08-27 NOTE — Progress Notes (Addendum)
STROKE TEAM PROGRESS NOTE   INTERVAL HISTORY Patient is seen in her room with her daughter at bedside.  Yesterday, she awoke with difficulty moving her left side, left facial droop and shortness of breath.  She was brought to the hospital and found to have a right M2 occlusion.  Mechanical thrombectomy was performed, and this procedure was successful.  Patient was noted to be in atrial fibrillation with RVR earlier and was started on amiodarone.  Need for anticoagulation was discussed, and patient was placed on Eliquis.  Vitals:   08/27/22 0800 08/27/22 0819 08/27/22 1119 08/27/22 1200  BP: (!) 132/97   127/64  Pulse: (!) 156   63  Resp: 18     Temp:  98.4 F (36.9 C) 98.5 F (36.9 C)   TempSrc:  Axillary Oral   SpO2: 100%     Weight:      Height:       CBC:  Recent Labs  Lab 08/26/22 0609 08/26/22 0614 08/26/22 1813 08/27/22 0343  WBC 6.9  --   --  7.3  NEUTROABS 5.2  --   --  5.6  HGB 12.4   < > 11.2* 11.8*  HCT 37.1   < > 33.0* 35.1*  MCV 89.8  --   --  90.5  PLT 194  --   --  161   < > = values in this interval not displayed.   Basic Metabolic Panel:  Recent Labs  Lab 08/26/22 2043 08/27/22 0343  NA 142 140  K 3.8 3.7  CL 115* 108  CO2 19* 20*  GLUCOSE 121* 118*  BUN 12 13  CREATININE 0.94 1.14*  CALCIUM 7.2* 8.5*  MG 1.7 2.4   Lipid Panel:  Recent Labs  Lab 08/27/22 0343  CHOL 139  TRIG 67  HDL 59  CHOLHDL 2.4  VLDL 13  LDLCALC 67   HgbA1c:  Recent Labs  Lab 08/27/22 0343  HGBA1C 5.6   Urine Drug Screen: No results for input(s): "LABOPIA", "COCAINSCRNUR", "LABBENZ", "AMPHETMU", "THCU", "LABBARB" in the last 168 hours.  Alcohol Level  Recent Labs  Lab 08/26/22 0609  ETH <10    IMAGING past 24 hours DG CHEST PORT 1 VIEW  Result Date: 08/26/2022 CLINICAL DATA:  History of endotracheal tube. EXAM: PORTABLE CHEST 1 VIEW COMPARISON:  Radiograph earlier today FINDINGS: Endotracheal tube tip is at the level of the clavicular heads 4 cm from  the carina. Cardiomegaly. Stable mediastinal contours allowing for rotation on the current exam. Aortic atherosclerosis. Improving interstitial opacities from earlier today. Small pleural effusions. Fluid in the right minor fissure. No pneumothorax. IMPRESSION: 1. Endotracheal tube tip at the level of the clavicular heads 4 cm from the carina. 2. Improving interstitial opacities from earlier today. Small pleural effusions. Electronically Signed   By: Keith Rake M.D.   On: 08/26/2022 20:07   MR BRAIN WO CONTRAST  Result Date: 08/26/2022 CLINICAL DATA:  Stroke, follow up EXAM: MRI HEAD WITHOUT CONTRAST TECHNIQUE: Multiplanar, multiecho pulse sequences of the brain and surrounding structures were obtained without intravenous contrast. COMPARISON:  CT head November 30, 23. FINDINGS: Brain: Small acute infarct in the right insula. Many small acute infarcts in the overlying right frontal lobe. No evidence of acute hemorrhage, mass lesion, midline shift or hydrocephalus. Vascular: Major arterial flow voids are maintained at the skull base. Skull and upper cervical spine: Normal marrow signal. Sinuses/Orbits: Mild paranasal sinus mucosal thickening. No acute orbital findings. Other: Embolized effusions. IMPRESSION: Multiple small acute  infarcts in the right insula and overlying right frontal lobe. Electronically Signed   By: Feliberto Harts M.D.   On: 08/26/2022 16:55   ECHOCARDIOGRAM COMPLETE  Result Date: 08/26/2022    ECHOCARDIOGRAM REPORT   Patient Name:   Sheila Jordan Date of Exam: 08/26/2022 Medical Rec #:  [id number]        Height:       64.0 in Accession #:    [id number]       Weight:       167.5 lb Date of Birth:  [birth year]        BSA:          1.815 m Patient Age:    88 years         BP:           164/77 mmHg Patient Gender: F                HR:           80 bpm. Exam Location:  Inpatient Procedure: 2D Echo, Cardiac Doppler and Color Doppler Indications:    Stroke  History:        Patient has  no prior history of Echocardiogram examinations.                 Risk Factors:Hypertension.  Sonographer:    Milda Smart Referring Phys: [email or web address] MCNEILL P KIRKPATRICK  Sonographer Comments: Technically difficult study due to poor echo windows and echo performed with patient supine and on artificial respirator. Image acquisition challenging due to patient body habitus and Image acquisition challenging due to respiratory  motion. IMPRESSIONS  1. Left ventricular ejection fraction, by estimation, is 50 to 55%. The left ventricle has low normal function. The left ventricle has no regional wall motion abnormalities. Left ventricular diastolic parameters are consistent with Grade II diastolic dysfunction (pseudonormalization). Elevated left ventricular end-diastolic pressure.  2. Right ventricular systolic function is normal. The right ventricular size is normal. There is mildly elevated pulmonary artery systolic pressure.  3. Left atrial size was severely dilated.  4. The mitral valve is normal in structure. Mild mitral valve regurgitation. No evidence of mitral stenosis.  5. The aortic valve is tricuspid. Aortic valve regurgitation is not visualized. No aortic stenosis is present.  6. Pulmonic valve regurgitation is moderate.  7. The inferior vena cava is normal in size with greater than 50% respiratory variability, suggesting right atrial pressure of 3 mmHg. FINDINGS  Left Ventricle: Left ventricular ejection fraction, by estimation, is 50 to 55%. The left ventricle has low normal function. The left ventricle has no regional wall motion abnormalities. The left ventricular internal cavity size was normal in size. There is no left ventricular hypertrophy. Left ventricular diastolic parameters are consistent with Grade II diastolic dysfunction (pseudonormalization). Elevated left ventricular end-diastolic pressure. Right Ventricle: The right ventricular size is normal. No increase in right ventricular wall thickness.  Right ventricular systolic function is normal. There is mildly elevated pulmonary artery systolic pressure. The tricuspid regurgitant velocity is 2.57  m/s, and with an assumed right atrial pressure of 15 mmHg, the estimated right ventricular systolic pressure is 41.4 mmHg. Left Atrium: Left atrial size was severely dilated. Right Atrium: Right atrial size was normal in size. Pericardium: There is no evidence of pericardial effusion. Mitral Valve: The mitral valve is normal in structure. Mild mitral annular calcification. Mild mitral valve regurgitation. No evidence of mitral valve stenosis. Tricuspid Valve: The tricuspid valve is normal in structure. Tricuspid valve  regurgitation is mild . No evidence of tricuspid stenosis. Aortic Valve: The aortic valve is tricuspid. Aortic valve regurgitation is not visualized. No aortic stenosis is present. Pulmonic Valve: The pulmonic valve was normal in structure. Pulmonic valve regurgitation is moderate. No evidence of pulmonic stenosis. Aorta: The aortic root is normal in size and structure. Venous: The inferior vena cava is normal in size with greater than 50% respiratory variability, suggesting right atrial pressure of 3 mmHg. IAS/Shunts: No atrial level shunt detected by color flow Doppler.  LEFT VENTRICLE PLAX 2D LVIDd:         3.90 cm   Diastology LVIDs:         2.90 cm   LV e' medial:    7.29 cm/s LV PW:         0.80 cm   LV E/e' medial:  14.5 LV IVS:        0.70 cm   LV e' lateral:   5.77 cm/s LVOT diam:     1.90 cm   LV E/e' lateral: 18.4 LV SV:         57 LV SV Index:   32 LVOT Area:     2.84 cm  RIGHT VENTRICLE RV S prime:     13.90 cm/s TAPSE (M-mode): 1.4 cm LEFT ATRIUM             Index        RIGHT ATRIUM           Index LA diam:        3.30 cm 1.82 cm/m   RA Area:     11.80 cm LA Vol (A2C):   92.6 ml 51.03 ml/m  RA Volume:   22.80 ml  12.56 ml/m LA Vol (A4C):   83.6 ml 46.07 ml/m LA Biplane Vol: 91.3 ml 50.31 ml/m  AORTIC VALVE             PULMONIC  VALVE LVOT Vmax:   85.70 cm/s  PR End Diast Vel: 4.67 msec LVOT Vmean:  64.000 cm/s LVOT VTI:    0.202 m  AORTA Ao Root diam: 2.80 cm Ao Asc diam:  3.20 cm MITRAL VALVE                  TRICUSPID VALVE MV Area (PHT): 3.58 cm       TR Peak grad:   26.4 mmHg MV Decel Time: 212 msec       TR Mean grad:   18.0 mmHg MR Peak grad:    67.6 mmHg    TR Vmax:        257.00 cm/s MR Vmax:         411.00 cm/s  TR Vmean:       204.0 cm/s MR PISA:         0.57 cm MR PISA Eff ROA: 4 mm        SHUNTS MR PISA Radius:  0.30 cm      Systemic VTI:  0.20 m MV E velocity: 106.00 cm/s    Systemic Diam: 1.90 cm MV A velocity: 91.40 cm/s MV E/A ratio:  1.16 Skeet Latch MD Electronically signed by Skeet Latch MD Signature Date/Time: 08/26/2022/4:03:57 PM    Final     PHYSICAL EXAM General: Alert, well-developed, well-nourished elderly patient in no acute distress Respiratory: Regular, unlabored respirations on room air  NEURO:  Mental Status: AA&Ox3  Speech/Language: speech is without dysarthria or aphasia.  Naming, fluency, and comprehension intact.  Cranial Nerves:  II: PERRL.  III, IV, VI: EOMI. Eyelids elevate symmetrically.  V: Sensation is intact to light touch and symmetrical to face.  VII: Smile is symmetrical.   VIII: hearing intact to voice. IX, X: Phonation is normal.  XII: tongue is midline without fasciculations. Motor: 5/5 strength to all muscle groups tested.  Tone: is normal and bulk is normal Sensation- Intact to light touch bilaterally.   Coordination: FTN intact bilaterally Gait- deferred   ASSESSMENT/PLAN Sheila Jordan is a 88 y.o. female with history of hypertension and CKD stage III presenting with difficulty moving her left side, left facial droop and shortness of breath.  She was brought to the hospital and found to have a right M2 occlusion.  Mechanical thrombectomy was performed, and this procedure was successful.  Patient was noted to be in atrial fibrillation with RVR  earlier and was started on amiodarone.  Need for anticoagulation was discussed, and patient was placed on Eliquis.  Stroke:  right MCA stroke status post mechanical thrombectomy Etiology: Cardioembolic in the setting of atrial fibrillation Code Stroke CT head hypodensity in right insula and temporal lobe ASPECTS 8   CTA head & neck acute occlusion of proximal right M2 CT perfusion no core infarct and 48 mL penumbra in right MCA territory MRI multiple small acute infarcts in right insula and frontal lobe 2D Echo EF 50 to [id number], grade 2 diastolic dyspnea function, severely dilated left atrium, no atrial level shunt  LDL 67 HgbA1c 5.6 VTE prophylaxis -fully anticoagulated with Eliquis    Diet   Diet regular Room service appropriate? Yes; Fluid consistency: Thin   No antithrombotic prior to admission, now on aspirin 81 mg daily and Eliquis (apixaban) daily.  Therapy recommendations: Home health PT Disposition: Pending  Hypertension Home meds:  none Stable Keep systolic blood pressure under 160 Long-term BP goal normotensive  Hyperlipidemia Home meds: None LDL 67, goal < 70 High intensity statin not indicated as LDL below goal Continue statin at discharge  Atrial fibrillation Patient was noted to be in A-fib with RVR We will begin anticoagulation with Eliquis  Other Stroke Risk Factors Advanced Age >/= 75   Other Active Problems none  Hospital day # [place] , MSN, AGACNP-BC [facility] See Amion for schedule and pager information 08/27/2022 1:55 PM   ATTENDING ATTESTATION:  88 year old with right M2 occlusion status post IR out of the window for TNK.  Etiology of her stroke is atrial fibrillation.  On exam she appears to be doing well and has a NIH stroke scale of 0.  Her strokes are small on MRI scan she is now 24 hours out will start Eliquis for stroke prevention.  Discussed the risk of bleeding with her as well as her daughter who is in the  room at the time.  They understand the risk and consented to starting anticoagulation.  We will start sliding scale insulin per pharmacy and continue titrate off Cleviprex.  CCM will transition amiodarone to p.o.  If she continues to do well in the afternoon will transition to a floor bed.  Plan discussed with CCM  Dr. Reeves Forth evaluated pt independently, reviewed imaging, chart, labs. Discussed and formulated plan with the Resident/APP. Changes were made to the note where appropriate. Please see APP/resident note above for details.     This patient is critically ill due to respiratory distress, stroke s/p tPA and at significant risk of neurological worsening, death form heart failure, respiratory failure,  recurrent stroke, bleeding from [facility], seizure, sepsis. This patient's care requires constant monitoring of vital signs, hemodynamics, respiratory and cardiac monitoring, review of multiple databases, neurological assessment, discussion with family, other specialists and medical decision making of high complexity. I spent 35 minutes of neurocritical care time in the care of this patient.   Jocsan Mcginley,MD  To contact Stroke Continuity provider, please refer to [email or web address] After hours, contact General Neurology

## 2022-08-27 NOTE — Progress Notes (Signed)
Referring Physician(s): Dr Roland Rack  Supervising Physician: Pedro Earls  Patient Status:  [facility] - In-pt  Chief Complaint:  Left side weakness  Subjective:  Dr Karenann Cai performed IR procedure yesterday: Successful mechanical thrombectomy with direct contact aspiration for treatment of a right M2/MCA proximal occlusion achieving complete recanalization (TICI 3) after 1 pass. No thromboembolic or hemorrhagic complication.  Pt up in bed Extubated just before Dr Debbrah Alar visit Pt is up in bed Using all 4s Speaking clearly and coherently Feels fine Son at bedside    Allergies: Patient has no known allergies.  Medications: Prior to Admission medications   Medication Sig Start Date End Date Taking? Authorizing Provider  alum & mag hydroxide-simeth (MYLANTA MAXIMUM STRENGTH) 400-400-40 MG/5ML suspension Take 15 mLs by mouth every 6 (six) hours as needed for indigestion. 08/21/22   Leath-Warren, Alda Lea, NP  ferrous sulfate 325 (65 FE) MG EC tablet Take 1 tablet (325 mg total) by mouth 2 (two) times daily. 06/21/20 08/20/20  Antonieta Pert, MD  nystatin-triamcinolone ointment (MYCOLOG) Apply 1 application topically 2 (two) times daily. To affected area. 08/02/19   Jonnie Kind, MD  omeprazole (PRILOSEC) 20 MG capsule Take 1 capsule (20 mg total) by mouth daily. 08/21/22   Leath-Warren, Alda Lea, NP  Vitamin D, Ergocalciferol, (DRISDOL) 1.25 MG (50000 UNIT) CAPS capsule Take 50,000 Units by mouth once a week. 05/19/20   [provider]     Vital Signs: BP (!) 132/97 (BP Location: Left Arm)   Pulse (!) 156   Temp 98.4 F (36.9 C) (Axillary)   Resp 18   Ht _0  (1.626 m)   Wt 167 lb 8.8 oz (76 kg)   SpO2 100%   BMI 28.76 kg/m   Physical Exam Constitutional:      Comments: Face symmetrical Smile = Speech is clear and coherent  Cardiovascular:     Comments: Tachy and irreg per monitor Musculoskeletal:         General: Normal range of motion.  Skin:    General: Skin is warm.     Comments: Right groin clean and dry NT no bleeding No hematoma  Neurological:     Mental Status: She is alert and oriented to person, place, and time.  Psychiatric:        Behavior: Behavior normal.     Imaging: DG CHEST PORT 1 VIEW  Result Date: 08/26/2022 CLINICAL DATA:  History of endotracheal tube. EXAM: PORTABLE CHEST 1 VIEW COMPARISON:  Radiograph earlier today FINDINGS: Endotracheal tube tip is at the level of the clavicular heads 4 cm from the carina. Cardiomegaly. Stable mediastinal contours allowing for rotation on the current exam. Aortic atherosclerosis. Improving interstitial opacities from earlier today. Small pleural effusions. Fluid in the right minor fissure. No pneumothorax. IMPRESSION: 1. Endotracheal tube tip at the level of the clavicular heads 4 cm from the carina. 2. Improving interstitial opacities from earlier today. Small pleural effusions. Electronically Signed   By: Keith Rake M.D.   On: 08/26/2022 20:07   MR BRAIN WO CONTRAST  Result Date: 08/26/2022 CLINICAL DATA:  Stroke, follow up EXAM: MRI HEAD WITHOUT CONTRAST TECHNIQUE: Multiplanar, multiecho pulse sequences of the brain and surrounding structures were obtained without intravenous contrast. COMPARISON:  CT head November 30, 23. FINDINGS: Brain: Small acute infarct in the right insula. Many small acute infarcts in the overlying right frontal lobe. No evidence of acute hemorrhage, mass lesion, midline shift or hydrocephalus. Vascular: Major arterial flow  voids are maintained at the skull base. Skull and upper cervical spine: Normal marrow signal. Sinuses/Orbits: Mild paranasal sinus mucosal thickening. No acute orbital findings. Other: Embolized effusions. IMPRESSION: Multiple small acute infarcts in the right insula and overlying right frontal lobe. Electronically Signed   By: Margaretha Sheffield M.D.   On: 08/26/2022 16:55    ECHOCARDIOGRAM COMPLETE  Result Date: 08/26/2022    ECHOCARDIOGRAM REPORT   Patient Name:   Sheila Jordan Date of Exam: 08/26/2022 Medical Rec #:  [id number]        Height:       64.0 in Accession #:    [id number]       Weight:       167.5 lb Date of Birth:  10-27-1927        BSA:          1.815 m Patient Age:    88 years         BP:           164/77 mmHg Patient Gender: F                HR:           80 bpm. Exam Location:  Inpatient Procedure: 2D Echo, Cardiac Doppler and Color Doppler Indications:    Stroke  History:        Patient has no prior history of Echocardiogram examinations.                 Risk Factors:Hypertension.  Sonographer:    Eartha Inch Referring Phys: Merlín.Osler MCNEILL P KIRKPATRICK  Sonographer Comments: Technically difficult study due to poor echo windows and echo performed with patient supine and on artificial respirator. Image acquisition challenging due to patient body habitus and Image acquisition challenging due to respiratory  motion. IMPRESSIONS  1. Left ventricular ejection fraction, by estimation, is 50 to 55%. The left ventricle has low normal function. The left ventricle has no regional wall motion abnormalities. Left ventricular diastolic parameters are consistent with Grade II diastolic dysfunction (pseudonormalization). Elevated left ventricular end-diastolic pressure.  2. Right ventricular systolic function is normal. The right ventricular size is normal. There is mildly elevated pulmonary artery systolic pressure.  3. Left atrial size was severely dilated.  4. The mitral valve is normal in structure. Mild mitral valve regurgitation. No evidence of mitral stenosis.  5. The aortic valve is tricuspid. Aortic valve regurgitation is not visualized. No aortic stenosis is present.  6. Pulmonic valve regurgitation is moderate.  7. The inferior vena cava is normal in size with greater than 50% respiratory variability, suggesting right atrial pressure of 3 mmHg. FINDINGS  Left  Ventricle: Left ventricular ejection fraction, by estimation, is 50 to 55%. The left ventricle has low normal function. The left ventricle has no regional wall motion abnormalities. The left ventricular internal cavity size was normal in size. There is no left ventricular hypertrophy. Left ventricular diastolic parameters are consistent with Grade II diastolic dysfunction (pseudonormalization). Elevated left ventricular end-diastolic pressure. Right Ventricle: The right ventricular size is normal. No increase in right ventricular wall thickness. Right ventricular systolic function is normal. There is mildly elevated pulmonary artery systolic pressure. The tricuspid regurgitant velocity is 2.57  m/s, and with an assumed right atrial pressure of 15 mmHg, the estimated right ventricular systolic pressure is 26.9 mmHg. Left Atrium: Left atrial size was severely dilated. Right Atrium: Right atrial size was normal in size. Pericardium: There is no evidence of pericardial effusion. Mitral Valve:  The mitral valve is normal in structure. Mild mitral annular calcification. Mild mitral valve regurgitation. No evidence of mitral valve stenosis. Tricuspid Valve: The tricuspid valve is normal in structure. Tricuspid valve regurgitation is mild . No evidence of tricuspid stenosis. Aortic Valve: The aortic valve is tricuspid. Aortic valve regurgitation is not visualized. No aortic stenosis is present. Pulmonic Valve: The pulmonic valve was normal in structure. Pulmonic valve regurgitation is moderate. No evidence of pulmonic stenosis. Aorta: The aortic root is normal in size and structure. Venous: The inferior vena cava is normal in size with greater than 50% respiratory variability, suggesting right atrial pressure of 3 mmHg. IAS/Shunts: No atrial level shunt detected by color flow Doppler.  LEFT VENTRICLE PLAX 2D LVIDd:         3.90 cm   Diastology LVIDs:         2.90 cm   LV e' medial:    7.29 cm/s LV PW:         0.80 cm   LV  E/e' medial:  14.5 LV IVS:        0.70 cm   LV e' lateral:   5.77 cm/s LVOT diam:     1.90 cm   LV E/e' lateral: 18.4 LV SV:         57 LV SV Index:   32 LVOT Area:     2.84 cm  RIGHT VENTRICLE RV S prime:     13.90 cm/s TAPSE (M-mode): 1.4 cm LEFT ATRIUM             Index        RIGHT ATRIUM           Index LA diam:        3.30 cm 1.82 cm/m   RA Area:     11.80 cm LA Vol (A2C):   92.6 ml 51.03 ml/m  RA Volume:   22.80 ml  12.56 ml/m LA Vol (A4C):   83.6 ml 46.07 ml/m LA Biplane Vol: 91.3 ml 50.31 ml/m  AORTIC VALVE             PULMONIC VALVE LVOT Vmax:   85.70 cm/s  PR End Diast Vel: 4.67 msec LVOT Vmean:  64.000 cm/s LVOT VTI:    0.202 m  AORTA Ao Root diam: 2.80 cm Ao Asc diam:  3.20 cm MITRAL VALVE                  TRICUSPID VALVE MV Area (PHT): 3.58 cm       TR Peak grad:   26.4 mmHg MV Decel Time: 212 msec       TR Mean grad:   18.0 mmHg MR Peak grad:    67.6 mmHg    TR Vmax:        257.00 cm/s MR Vmax:         411.00 cm/s  TR Vmean:       204.0 cm/s MR PISA:         0.57 cm MR PISA Eff ROA: 4 mm        SHUNTS MR PISA Radius:  0.30 cm      Systemic VTI:  0.20 m MV E velocity: 106.00 cm/s    Systemic Diam: 1.90 cm MV A velocity: 91.40 cm/s MV E/A ratio:  1.16 Skeet Latch MD Electronically signed by Skeet Latch MD Signature Date/Time: 08/26/2022/4:03:57 PM    Final    IR PERCUTANEOUS ART THROMBECTOMY/INFUSION INTRACRANIAL INC DIAG ANGIO  Result Date:  08/26/2022 INDICATION: 89 year old female with past medical history significant hypertension who presented to [facility] with shortness of breath and left-sided weakness, NIHSS 6. Her last known well was 10 p.m. on 08/25/2022. Head CT showed no acute infarct (ASPECTS 10). CT angiogram of the head and neck showed an occlusion of a proximal right M2/MCA posterior middle branch. She did not received thrombolytic as she was outside the window. She was transferred to [facility] for mechanical thrombectomy. A repeat head CT was obtained,  however, the study was significantly degraded by motion. Decision made to proceed with mechanical thrombectomy. EXAM: ULTRASOUND-GUIDED VASCULAR ACCESS DIAGNOSTIC CEREBRAL ANGIOGRAM MECHANICAL THROMBECTOMY FLAT PANEL HEAD CT COMPARISON:  CT/CT angiogram of the head and neck August 26, 2022. MEDICATIONS: No antibiotics administered. ANESTHESIA/SEDATION: The procedure was performed under general anesthesia. CONTRAST:  50 mL of Omnipaque 300 milligram/mL. FLUOROSCOPY: Radiation Exposure Index (as provided by the fluoroscopic device): 341 mGy Kerma COMPLICATIONS: None immediate. TECHNIQUE: Informed written consent was waved due to inability to locate next of kin or healthcare proxy. Two physician discussion held by myself and Dr. Leonel Ramsay. Decision to proceed with intervention in the best interest of the patient. Maximal Sterile Barrier Technique was utilized including caps, mask, sterile gowns, sterile gloves, sterile drape, hand hygiene and skin antiseptic. A timeout was performed prior to the initiation of the procedure. The right groin was prepped and draped in the usual sterile fashion. Using a micropuncture kit and the modified Seldinger technique, access was gained to the right common femoral artery and an 8 French sheath was placed. Real-time ultrasound guidance was utilized for vascular access including the acquisition of a permanent ultrasound image documenting patency of the accessed vessel. Under fluoroscopy, a Zoom 88 guide catheter was navigated over a 6 [place] Berenstein 2 catheter and a 0.035" Terumo Glidewire into the aortic arch. The catheter was placed into the right common carotid artery and then advanced into the right internal carotid artery. The diagnostic catheter was removed. Frontal and lateral angiograms of the head were obtained. FINDINGS: 1. Normal caliber of the right common femoral artery, adequate for vascular access. 2. There is an occlusion of a proximal right M2/MCA middle  division branch. PROCEDURE: Using biplane roadmap guidance, a Zoom 55 aspiration catheter was navigated over Colossus 35 microguidewire into the cavernous segment of the right ICA. The aspiration catheter was then advanced to the level of occlusion and connected to an aspiration pump. Continuous aspiration was performed for 1.5 minute. The guide catheter was connected to a VacLok syringe. The aspiration catheter was subsequently removed under constant aspiration. The guide catheter was aspirated for debris. Right internal carotid artery angiograms with frontal lateral views of the head showed complete recanalization of the right M2/MCA branch (TICI 3). Flat panel CT of the head was obtained and post processed in a separate workstation with concurrent attending physician supervision. Selected images were sent to PACS. Minimal contrast staining in the right sylvian fissure is noted. The catheter was subsequently withdrawn. Right common femoral artery angiogram was obtained in right anterior oblique view. The puncture is at the level of the common femoral artery. The artery has mild atherosclerotic changes without significant stenosis, adequate for closure device. The sheath was exchanged over the wire for a Perclose prostyle which was utilized for access closure. Immediate hemostasis was achieved. IMPRESSION: Successful mechanical thrombectomy with direct contact aspiration for treatment of a right M2/MCA proximal occlusion achieving complete recanalization (TICI 3) after 1 pass. No thromboembolic or hemorrhagic complication. PLAN:  Transfer to ICU for continued post stroke care. Electronically Signed   By: Pedro Earls M.D.   On: 08/26/2022 11:55   IR US Guide Vasc Access Right  Result Date: 08/26/2022 INDICATION: 88 year old female with past medical history significant hypertension who presented to [facility] with shortness of breath and left-sided weakness, NIHSS 6. Her last known well  was 10 p.m. on 08/25/2022. Head CT showed no acute infarct (ASPECTS 10). CT angiogram of the head and neck showed an occlusion of a proximal right M2/MCA posterior middle branch. She did not received thrombolytic as she was outside the window. She was transferred to [facility] for mechanical thrombectomy. A repeat head CT was obtained, however, the study was significantly degraded by motion. Decision made to proceed with mechanical thrombectomy. EXAM: ULTRASOUND-GUIDED VASCULAR ACCESS DIAGNOSTIC CEREBRAL ANGIOGRAM MECHANICAL THROMBECTOMY FLAT PANEL HEAD CT COMPARISON:  CT/CT angiogram of the head and neck August 26, 2022. MEDICATIONS: No antibiotics administered. ANESTHESIA/SEDATION: The procedure was performed under general anesthesia. CONTRAST:  50 mL of Omnipaque 300 milligram/mL. FLUOROSCOPY: Radiation Exposure Index (as provided by the fluoroscopic device): 409 mGy Kerma COMPLICATIONS: None immediate. TECHNIQUE: Informed written consent was waved due to inability to locate next of kin or healthcare proxy. Two physician discussion held by myself and Dr. Leonel Ramsay. Decision to proceed with intervention in the best interest of the patient. Maximal Sterile Barrier Technique was utilized including caps, mask, sterile gowns, sterile gloves, sterile drape, hand hygiene and skin antiseptic. A timeout was performed prior to the initiation of the procedure. The right groin was prepped and draped in the usual sterile fashion. Using a micropuncture kit and the modified Seldinger technique, access was gained to the right common femoral artery and an 8 French sheath was placed. Real-time ultrasound guidance was utilized for vascular access including the acquisition of a permanent ultrasound image documenting patency of the accessed vessel. Under fluoroscopy, a Zoom 88 guide catheter was navigated over a 6 [place] Berenstein 2 catheter and a 0.035" Terumo Glidewire into the aortic arch. The catheter was placed into the  right common carotid artery and then advanced into the right internal carotid artery. The diagnostic catheter was removed. Frontal and lateral angiograms of the head were obtained. FINDINGS: 1. Normal caliber of the right common femoral artery, adequate for vascular access. 2. There is an occlusion of a proximal right M2/MCA middle division branch. PROCEDURE: Using biplane roadmap guidance, a Zoom 55 aspiration catheter was navigated over Colossus 35 microguidewire into the cavernous segment of the right ICA. The aspiration catheter was then advanced to the level of occlusion and connected to an aspiration pump. Continuous aspiration was performed for 1.5 minute. The guide catheter was connected to a VacLok syringe. The aspiration catheter was subsequently removed under constant aspiration. The guide catheter was aspirated for debris. Right internal carotid artery angiograms with frontal lateral views of the head showed complete recanalization of the right M2/MCA branch (TICI 3). Flat panel CT of the head was obtained and post processed in a separate workstation with concurrent attending physician supervision. Selected images were sent to PACS. Minimal contrast staining in the right sylvian fissure is noted. The catheter was subsequently withdrawn. Right common femoral artery angiogram was obtained in right anterior oblique view. The puncture is at the level of the common femoral artery. The artery has mild atherosclerotic changes without significant stenosis, adequate for closure device. The sheath was exchanged over the wire for a Perclose prostyle which was utilized for access  closure. Immediate hemostasis was achieved. IMPRESSION: Successful mechanical thrombectomy with direct contact aspiration for treatment of a right M2/MCA proximal occlusion achieving complete recanalization (TICI 3) after 1 pass. No thromboembolic or hemorrhagic complication. PLAN: Transfer to ICU for continued post stroke care.  Electronically Signed   By: Pedro Earls M.D.   On: 08/26/2022 11:55   IR CT Head Ltd  Result Date: 08/26/2022 INDICATION: 88 year old female with past medical history significant hypertension who presented to [facility] with shortness of breath and left-sided weakness, NIHSS 6. Her last known well was 10 p.m. on 08/25/2022. Head CT showed no acute infarct (ASPECTS 10). CT angiogram of the head and neck showed an occlusion of a proximal right M2/MCA posterior middle branch. She did not received thrombolytic as she was outside the window. She was transferred to [facility] for mechanical thrombectomy. A repeat head CT was obtained, however, the study was significantly degraded by motion. Decision made to proceed with mechanical thrombectomy. EXAM: ULTRASOUND-GUIDED VASCULAR ACCESS DIAGNOSTIC CEREBRAL ANGIOGRAM MECHANICAL THROMBECTOMY FLAT PANEL HEAD CT COMPARISON:  CT/CT angiogram of the head and neck August 26, 2022. MEDICATIONS: No antibiotics administered. ANESTHESIA/SEDATION: The procedure was performed under general anesthesia. CONTRAST:  50 mL of Omnipaque 300 milligram/mL. FLUOROSCOPY: Radiation Exposure Index (as provided by the fluoroscopic device): 161 mGy Kerma COMPLICATIONS: None immediate. TECHNIQUE: Informed written consent was waved due to inability to locate next of kin or healthcare proxy. Two physician discussion held by myself and Dr. Leonel Ramsay. Decision to proceed with intervention in the best interest of the patient. Maximal Sterile Barrier Technique was utilized including caps, mask, sterile gowns, sterile gloves, sterile drape, hand hygiene and skin antiseptic. A timeout was performed prior to the initiation of the procedure. The right groin was prepped and draped in the usual sterile fashion. Using a micropuncture kit and the modified Seldinger technique, access was gained to the right common femoral artery and an 8 French sheath was placed. Real-time  ultrasound guidance was utilized for vascular access including the acquisition of a permanent ultrasound image documenting patency of the accessed vessel. Under fluoroscopy, a Zoom 88 guide catheter was navigated over a 6 [place] Berenstein 2 catheter and a 0.035" Terumo Glidewire into the aortic arch. The catheter was placed into the right common carotid artery and then advanced into the right internal carotid artery. The diagnostic catheter was removed. Frontal and lateral angiograms of the head were obtained. FINDINGS: 1. Normal caliber of the right common femoral artery, adequate for vascular access. 2. There is an occlusion of a proximal right M2/MCA middle division branch. PROCEDURE: Using biplane roadmap guidance, a Zoom 55 aspiration catheter was navigated over Colossus 35 microguidewire into the cavernous segment of the right ICA. The aspiration catheter was then advanced to the level of occlusion and connected to an aspiration pump. Continuous aspiration was performed for 1.5 minute. The guide catheter was connected to a VacLok syringe. The aspiration catheter was subsequently removed under constant aspiration. The guide catheter was aspirated for debris. Right internal carotid artery angiograms with frontal lateral views of the head showed complete recanalization of the right M2/MCA branch (TICI 3). Flat panel CT of the head was obtained and post processed in a separate workstation with concurrent attending physician supervision. Selected images were sent to PACS. Minimal contrast staining in the right sylvian fissure is noted. The catheter was subsequently withdrawn. Right common femoral artery angiogram was obtained in right anterior oblique view. The puncture is at the level of  the common femoral artery. The artery has mild atherosclerotic changes without significant stenosis, adequate for closure device. The sheath was exchanged over the wire for a Perclose prostyle which was utilized for access  closure. Immediate hemostasis was achieved. IMPRESSION: Successful mechanical thrombectomy with direct contact aspiration for treatment of a right M2/MCA proximal occlusion achieving complete recanalization (TICI 3) after 1 pass. No thromboembolic or hemorrhagic complication. PLAN: Transfer to ICU for continued post stroke care. Electronically Signed   By: Pedro Earls M.D.   On: 08/26/2022 11:55   CT HEAD CODE STROKE WO CONTRAST  Result Date: 08/26/2022 CLINICAL DATA:  Code stroke.  Left arm weakness EXAM: CT HEAD WITHOUT CONTRAST TECHNIQUE: Contiguous axial images were obtained from the base of the skull through the vertex without intravenous contrast. RADIATION DOSE REDUCTION: This exam was performed according to the departmental dose-optimization program which includes automated exposure control, adjustment of the mA and/or kV according to patient size and/or use of iterative reconstruction technique. COMPARISON:  Same day CT head. FINDINGS: Image quality is significantly degraded by motion and streak artifact. Brain: There is hypodensity with loss of gray-white differentiation in the right insula and temporal lobe suspicious for early MCA infarct given finding on same-day CTA. There is no evidence of hemorrhagic transformation. There is no other evidence of evolved infarct. The basal ganglia appear preserved. There is no acute intracranial hemorrhage or extra-axial fluid collection. Background parenchymal volume is normal. The ventricles are stable in size. There is no mass lesion. There is no mass effect or midline shift. Vascular: There is hyperdensity in the right M2 vessel corresponding to the occlusion seen on the same-day CTA. Skull: Normal. Negative for fracture or focal lesion. Sinuses/Orbits: The paranasal sinuses are clear. Bilateral lens implants are in place. The globes and orbits are otherwise unremarkable. Other: None. ASPECTS [facility] Stroke Program Early CT Score) -  Ganglionic level infarction (caudate, lentiform nuclei, internal capsule, insula, M1-M3 cortex): 5 - Supraganglionic infarction (M4-M6 cortex): 3 Total score (0-10 with 10 being normal): 8 IMPRESSION: 1. Hypodensity in the right insula and temporal lobe likely reflecting evolving infarct in the right MCA distribution given finding on earlier CTA, though evaluation is degraded by motion and streak artifact. Aspects is 8. 2. No acute intracranial hemorrhage. Electronically Signed   By: Valetta Mole M.D.   On: 08/26/2022 09:29   DG Chest Portable 1 View  Result Date: 08/26/2022 CLINICAL DATA:  Shortness of breath. EXAM: PORTABLE CHEST 1 VIEW COMPARISON:  August 29, 2015. FINDINGS: Stable cardiomegaly. Status post right total shoulder arthroplasty. Mild bilateral perihilar and basilar interstitial densities are noted concerning for edema or possible atelectasis. IMPRESSION: Mild bilateral perihilar and basilar interstitial densities are noted concerning for edema or possibly atelectasis. Aortic Atherosclerosis (ICD10-I70.0). Electronically Signed   By: Marijo Conception M.D.   On: 08/26/2022 08:27   CT ANGIO HEAD NECK W WO CM W PERF  Result Date: 08/26/2022 CLINICAL DATA:  Stroke follow up EXAM: CT ANGIOGRAPHY HEAD AND NECK CT PERFUSION BRAIN TECHNIQUE: Multidetector CT imaging of the head and neck was performed using the standard protocol during bolus administration of intravenous contrast. Multiplanar CT image reconstructions and MIPs were obtained to evaluate the vascular anatomy. Carotid stenosis measurements (when applicable) are obtained utilizing NASCET criteria, using the distal internal carotid diameter as the denominator. Multiphase CT imaging of the brain was performed following IV bolus contrast injection. Subsequent parametric perfusion maps were calculated using RAPID software. RADIATION DOSE REDUCTION: This exam  was performed according to the departmental dose-optimization program which includes  automated exposure control, adjustment of the mA and/or kV according to patient size and/or use of iterative reconstruction technique. CONTRAST:  11m OMNIPAQUE IOHEXOL 350 MG/ML SOLN COMPARISON:  CT Chest 08/27/17 FINDINGS: CT HEAD FINDINGS See same day CT brain for intracranial findings. No contrast-enhancing lesion is visualized. Note that assessment contrast enhancement is limited due to timing of the contrast bolus. Bilateral lens replacements. No significant paranasal sinus disease. Presence of IV contrast markedly limits the ability to assess for intracranial hemorrhage. Review of the MIP images confirms the above findings CTA NECK FINDINGS Aortic arch: Standard branching. Imaged portion shows no evidence of aneurysm or dissection. No significant stenosis of the major arch vessel origins. Right carotid system: There is atherosclerotic plaque at the carotid bifurcation that results in mild narrowing of the origin of the right ICA. Left carotid system: There is atherosclerotic plaque at the carotid bifurcation that results in mild narrowing at the origin of the left ICA. Vertebral arteries: There is mild to moderate narrowing of the V4 segment of the left vertebral artery. Right dominant system. Skeleton: Multilevel degenerative changes with degenerative pannus at C1-C2. Compared to prior exam from 2018 there is also a severe compression deformity at T5 with near-complete collapse of the vertebral body. Other neck: Negative. Upper chest: Small bilateral pleural effusions. The esophagus is patulous. Ground-glass appearance bilateral lung apices may be related to expiratory phase of imaging. Review of the MIP images confirms the above findings CTA HEAD FINDINGS Anterior circulation: Acute occlusion of a proximal right M2 segment (series 7, image 105). Bilateral A1 and A2 segments are normal. The left M1 and M2 segments are normal. Posterior circulation: Fetal type PCA on the right with the right P2 segment  predominantly supplied by the right PCOM. Venous sinuses: As permitted by contrast timing, patent. Anatomic variants: Fetal PCA on the right. Review of the MIP images confirms the above findings CT Brain Perfusion Findings: ASPECTS: 10 CBF (<30%) Volume: 020mPerfusion (Tmax>6.0s) volume: 4837mismatch Volume: 7m45mfarction Location:No infarct. IMPRESSION: 1. Acute occlusion of a proximal right M2 segment. 2. CT perfusion demonstrates no core infarct, but there is a mismatch volume of 48 mL in the right MCA territory. 3. Severe compression deformity at T5 with near-complete collapse of the vertebral body. This is age indeterminate, but new compared to 2018. 4. Small bilateral pleural effusions and mild pulmonary edema. Findings were discussed with Dr. DixoDoren Custard11/20/23 at 8:04 AM. Electronically Signed   By: HemaMarin Roberts.   On: 08/26/2022 08:20   CT HEAD WO CONTRAST  Result Date: 08/26/2022 CLINICAL DATA:  Left arm weakness EXAM: CT HEAD WITHOUT CONTRAST TECHNIQUE: Contiguous axial images were obtained from the base of the skull through the vertex without intravenous contrast. RADIATION DOSE REDUCTION: This exam was performed according to the departmental dose-optimization program which includes automated exposure control, adjustment of the mA and/or kV according to patient size and/or use of iterative reconstruction technique. COMPARISON:  None Available. FINDINGS: Brain: No evidence of acute infarction, hemorrhage, hydrocephalus, extra-axial collection or mass lesion/mass effect. Patchy low-density in the cerebral white matter attributed to chronic small vessel ischemia. Low-density areas in the right thalamus and right pons appear discrete on reformats and are attributed to chronic lacune. Age normal brain volume Vascular: No hyperdense vessel or unexpected calcification. Extensive atheromatous calcification Skull: Negative Sinuses/Orbits: Negative Other: Prelim sent in epic chat IMPRESSION: 1. No  acute finding.  ASPECTS  is 10. 2. Chronic small vessel ischemia. Electronically Signed   By: Jorje Guild M.D.   On: 08/26/2022 06:27    Labs:  CBC: Recent Labs    08/26/22 0609 08/26/22 0614 08/26/22 1813 08/27/22 0343  WBC 6.9  --   --  7.3  HGB 12.4 13.3 11.2* 11.8*  HCT 37.1 39.0 33.0* 35.1*  PLT 194  --   --  161    COAGS: Recent Labs    08/26/22 0609  INR 1.1  APTT 28    BMP: Recent Labs    08/26/22 0609 08/26/22 0614 08/26/22 1813 08/26/22 2043 08/27/22 0343  NA 140 142 140 142 140  K 3.7 3.7 3.7 3.8 3.7  CL 112* 109  --  115* 108  CO2 21*  --   --  19* 20*  GLUCOSE 122* 120*  --  121* 118*  BUN 17 17  --  12 13  CALCIUM 8.6*  --   --  7.2* 8.5*  CREATININE 1.01* 1.00  --  0.94 1.14*  GFRNONAA 52*  --   --  57* 45*    LIVER FUNCTION TESTS: Recent Labs    08/26/22 0609 08/26/22 2043 08/27/22 0343  BILITOT 1.1 0.5 0.5  AST 38 38 37  ALT 38 43 47*  ALKPHOS 104 98 113  PROT 7.0 5.1* 6.0*  ALBUMIN 4.2 3.1* 3.6    Assessment and Plan:  Occlusion of a proximal M2/MCA branch. Mechanical thrombectomy performed with direct contact aspiration achieving complete recanalization after 1 pass (TICI3). No thromboembolic or hemorrhagic complication.  Doing well  Dr Debbrah Alar has examined pt Plan for NIR sign off Plan per Neuro and Cardiology     Electronically Signed: Lavonia Drafts, PA-C 08/27/2022, 9:57 AM   I spent a total of 15 Minutes at the the patient's bedside AND on the patient's hospital floor or unit, greater than 50% of which was counseling/coordinating care for  M2/MCA branch. Mechanical thrombectomy performed with direct contact aspiration achieving complete recanalization after 1 pass (TICI3)

## 2022-08-27 NOTE — Procedures (Signed)
Extubation Procedure Note  Patient Details:   Name: Sheila Jordan DOB: [id number] MRN: [id number]   Airway Documentation:    Vent end date: 08/27/22 Vent end time: 0915   Evaluation  O2 sats: stable throughout Complications: No apparent complications Patient did tolerate procedure well. Bilateral Breath Sounds: Diminished, Rhonchi   Yes  Pt was extubated per MD order and placed on 2 L [place]. Cuff leak noted prior to extubation and no stridor post. Rt will monitor.  Merlene Laughter 08/27/2022, 9:15 AM

## 2022-08-27 NOTE — Discharge Summary (Shared)
Stroke Discharge Summary  Patient ID: Sheila Jordan   MRN: [id number]      DOB: Jan 18, 1928  Date of Admission: 08/26/2022 Date of Discharge: 08/29/2022  Attending Physician:  Egbert Garibaldi MD Consultant(s):    pulmonary/intensive care, Cardiology    Patient's PCP:  Asencion Noble, MD  DISCHARGE DIAGNOSIS: Right MCA stroke status post mechanical thrombectomy, likely cardioembolic in the setting of A-fib Principal Problem:   Stroke (cerebrum) [facility]) Active Problems:   Acute hypoxemic respiratory failure ([employer])   Atrial fibrillation with RVR ([place])   Acute pulmonary edema ([place])   Allergies as of 08/29/2022   No Known Allergies      Medication List     TAKE these medications    amiodarone 400 MG tablet Commonly known as: PACERONE Take 1 tablet (400 mg total) by mouth daily.   aspirin EC 81 MG tablet Take 1 tablet (81 mg total) by mouth daily. Swallow whole.   docusate sodium 100 MG capsule Commonly known as: COLACE Take 100 mg by mouth daily as needed for mild constipation.   Eliquis 5 MG Tabs tablet Generic drug: apixaban Take 1 tablet (5 mg total) by mouth 2 (two) times daily.   ferrous sulfate 325 (65 FE) MG EC tablet Take 1 tablet (325 mg total) by mouth 2 (two) times daily.   metoprolol tartrate 25 MG tablet Commonly known as: LOPRESSOR Take 1 tablet (25 mg total) by mouth 2 (two) times daily.   multivitamin tablet Take 1 tablet by mouth daily.   Mylanta Maximum Strength 400-400-40 MG/5ML suspension Generic drug: alum & mag hydroxide-simeth Take 15 mLs by mouth every 6 (six) hours as needed for indigestion.   omeprazole 20 MG capsule Commonly known as: PRILOSEC Take 1 capsule (20 mg total) by mouth daily.        LABORATORY STUDIES CBC    Component Value Date/Time   WBC 7.3 08/27/2022 0343   RBC 3.88 08/27/2022 0343   HGB 11.8 (L) 08/27/2022 0343   HCT 35.1 (L) 08/27/2022 0343   PLT 161 08/27/2022 0343   MCV 90.5 08/27/2022 0343   MCH  30.4 08/27/2022 0343   MCHC 33.6 08/27/2022 0343   RDW 14.4 08/27/2022 0343   LYMPHSABS 0.9 08/27/2022 0343   MONOABS 0.7 08/27/2022 0343   EOSABS 0.0 08/27/2022 0343   BASOSABS 0.0 08/27/2022 0343   CMP    Component Value Date/Time   NA 140 08/27/2022 0343   K 3.7 08/27/2022 0343   CL 108 08/27/2022 0343   CO2 20 (L) 08/27/2022 0343   GLUCOSE 118 (H) 08/27/2022 0343   BUN 13 08/27/2022 0343   CREATININE 1.14 (H) 08/27/2022 0343   CALCIUM 8.5 (L) 08/27/2022 0343   PROT 6.0 (L) 08/27/2022 0343   ALBUMIN 3.6 08/27/2022 0343   AST 37 08/27/2022 0343   ALT 47 (H) 08/27/2022 0343   ALKPHOS 113 08/27/2022 0343   BILITOT 0.5 08/27/2022 0343   GFRNONAA 45 (L) 08/27/2022 0343   GFRAA 51 (L) 06/20/2020 0512   COAGS Lab Results  Component Value Date   INR 1.1 08/26/2022   Lipid Panel    Component Value Date/Time   CHOL 139 08/27/2022 0343   TRIG 67 08/27/2022 0343   HDL 59 08/27/2022 0343   CHOLHDL 2.4 08/27/2022 0343   VLDL 13 08/27/2022 0343   LDLCALC 67 08/27/2022 0343   HgbA1C  Lab Results  Component Value Date   HGBA1C 5.6 08/27/2022   Urinalysis  Component Value Date/Time   BILIRUBINUR small (A) 08/21/2022 1207   KETONESUR trace (5) (A) 08/21/2022 1207   PROTEINUR =30 (A) 08/21/2022 1207   UROBILINOGEN 0.2 08/21/2022 1207   NITRITE Negative 08/21/2022 1207   LEUKOCYTESUR Negative 08/21/2022 1207   Urine Drug Screen No results found for: "LABOPIA", "COCAINSCRNUR", "LABBENZ", "AMPHETMU", "THCU", "LABBARB"  Alcohol Level    Component Value Date/Time   ETH <10 08/26/2022 2951     SIGNIFICANT DIAGNOSTIC STUDIES DG CHEST PORT 1 VIEW  Result Date: 08/26/2022 CLINICAL DATA:  History of endotracheal tube. EXAM: PORTABLE CHEST 1 VIEW COMPARISON:  Radiograph earlier today FINDINGS: Endotracheal tube tip is at the level of the clavicular heads 4 cm from the carina. Cardiomegaly. Stable mediastinal contours allowing for rotation on the current exam. Aortic  atherosclerosis. Improving interstitial opacities from earlier today. Small pleural effusions. Fluid in the right minor fissure. No pneumothorax. IMPRESSION: 1. Endotracheal tube tip at the level of the clavicular heads 4 cm from the carina. 2. Improving interstitial opacities from earlier today. Small pleural effusions. Electronically Signed   By: Keith Rake M.D.   On: 08/26/2022 20:07   MR BRAIN WO CONTRAST  Result Date: 08/26/2022 CLINICAL DATA:  Stroke, follow up EXAM: MRI HEAD WITHOUT CONTRAST TECHNIQUE: Multiplanar, multiecho pulse sequences of the brain and surrounding structures were obtained without intravenous contrast. COMPARISON:  CT head November 30, 23. FINDINGS: Brain: Small acute infarct in the right insula. Many small acute infarcts in the overlying right frontal lobe. No evidence of acute hemorrhage, mass lesion, midline shift or hydrocephalus. Vascular: Major arterial flow voids are maintained at the skull base. Skull and upper cervical spine: Normal marrow signal. Sinuses/Orbits: Mild paranasal sinus mucosal thickening. No acute orbital findings. Other: Embolized effusions. IMPRESSION: Multiple small acute infarcts in the right insula and overlying right frontal lobe. Electronically Signed   By: Margaretha Sheffield M.D.   On: 08/26/2022 16:55   ECHOCARDIOGRAM COMPLETE  Result Date: 08/26/2022    ECHOCARDIOGRAM REPORT   Patient Name:   Sheila Jordan Date of Exam: 08/26/2022 Medical Rec #:  [id number]        Height:       64.0 in Accession #:    [id number]       Weight:       167.5 lb Date of Birth:  30-Jul-1928        BSA:          1.815 m Patient Age:    88 years         BP:           164/77 mmHg Patient Gender: F                HR:           80 bpm. Exam Location:  Inpatient Procedure: 2D Echo, Cardiac Doppler and Color Doppler Indications:    Stroke  History:        Patient has no prior history of Echocardiogram examinations.                 Risk Factors:Hypertension.   Sonographer:    Eartha Inch Referring Phys: Merlín.Osler MCNEILL P KIRKPATRICK  Sonographer Comments: Technically difficult study due to poor echo windows and echo performed with patient supine and on artificial respirator. Image acquisition challenging due to patient body habitus and Image acquisition challenging due to respiratory  motion. IMPRESSIONS  1. Left ventricular ejection fraction, by estimation, is 50 to 55%. The left ventricle  has low normal function. The left ventricle has no regional wall motion abnormalities. Left ventricular diastolic parameters are consistent with Grade II diastolic dysfunction (pseudonormalization). Elevated left ventricular end-diastolic pressure.  2. Right ventricular systolic function is normal. The right ventricular size is normal. There is mildly elevated pulmonary artery systolic pressure.  3. Left atrial size was severely dilated.  4. The mitral valve is normal in structure. Mild mitral valve regurgitation. No evidence of mitral stenosis.  5. The aortic valve is tricuspid. Aortic valve regurgitation is not visualized. No aortic stenosis is present.  6. Pulmonic valve regurgitation is moderate.  7. The inferior vena cava is normal in size with greater than 50% respiratory variability, suggesting right atrial pressure of 3 mmHg. FINDINGS  Left Ventricle: Left ventricular ejection fraction, by estimation, is 50 to 55%. The left ventricle has low normal function. The left ventricle has no regional wall motion abnormalities. The left ventricular internal cavity size was normal in size. There is no left ventricular hypertrophy. Left ventricular diastolic parameters are consistent with Grade II diastolic dysfunction (pseudonormalization). Elevated left ventricular end-diastolic pressure. Right Ventricle: The right ventricular size is normal. No increase in right ventricular wall thickness. Right ventricular systolic function is normal. There is mildly elevated pulmonary artery  systolic pressure. The tricuspid regurgitant velocity is 2.57  m/s, and with an assumed right atrial pressure of 15 mmHg, the estimated right ventricular systolic pressure is 20.9 mmHg. Left Atrium: Left atrial size was severely dilated. Right Atrium: Right atrial size was normal in size. Pericardium: There is no evidence of pericardial effusion. Mitral Valve: The mitral valve is normal in structure. Mild mitral annular calcification. Mild mitral valve regurgitation. No evidence of mitral valve stenosis. Tricuspid Valve: The tricuspid valve is normal in structure. Tricuspid valve regurgitation is mild . No evidence of tricuspid stenosis. Aortic Valve: The aortic valve is tricuspid. Aortic valve regurgitation is not visualized. No aortic stenosis is present. Pulmonic Valve: The pulmonic valve was normal in structure. Pulmonic valve regurgitation is moderate. No evidence of pulmonic stenosis. Aorta: The aortic root is normal in size and structure. Venous: The inferior vena cava is normal in size with greater than 50% respiratory variability, suggesting right atrial pressure of 3 mmHg. IAS/Shunts: No atrial level shunt detected by color flow Doppler.  LEFT VENTRICLE PLAX 2D LVIDd:         3.90 cm   Diastology LVIDs:         2.90 cm   LV e' medial:    7.29 cm/s LV PW:         0.80 cm   LV E/e' medial:  14.5 LV IVS:        0.70 cm   LV e' lateral:   5.77 cm/s LVOT diam:     1.90 cm   LV E/e' lateral: 18.4 LV SV:         57 LV SV Index:   32 LVOT Area:     2.84 cm  RIGHT VENTRICLE RV S prime:     13.90 cm/s TAPSE (M-mode): 1.4 cm LEFT ATRIUM             Index        RIGHT ATRIUM           Index LA diam:        3.30 cm 1.82 cm/m   RA Area:     11.80 cm LA Vol (A2C):   92.6 ml 51.03 ml/m  RA Volume:   22.80  ml  12.56 ml/m LA Vol (A4C):   83.6 ml 46.07 ml/m LA Biplane Vol: 91.3 ml 50.31 ml/m  AORTIC VALVE             PULMONIC VALVE LVOT Vmax:   85.70 cm/s  PR End Diast Vel: 4.67 msec LVOT Vmean:  64.000 cm/s LVOT VTI:     0.202 m  AORTA Ao Root diam: 2.80 cm Ao Asc diam:  3.20 cm MITRAL VALVE                  TRICUSPID VALVE MV Area (PHT): 3.58 cm       TR Peak grad:   26.4 mmHg MV Decel Time: 212 msec       TR Mean grad:   18.0 mmHg MR Peak grad:    67.6 mmHg    TR Vmax:        257.00 cm/s MR Vmax:         411.00 cm/s  TR Vmean:       204.0 cm/s MR PISA:         0.57 cm MR PISA Eff ROA: 4 mm        SHUNTS MR PISA Radius:  0.30 cm      Systemic VTI:  0.20 m MV E velocity: 106.00 cm/s    Systemic Diam: 1.90 cm MV A velocity: 91.40 cm/s MV E/A ratio:  1.16 Skeet Latch MD Electronically signed by Skeet Latch MD Signature Date/Time: 08/26/2022/4:03:57 PM    Final    IR PERCUTANEOUS ART THROMBECTOMY/INFUSION INTRACRANIAL INC DIAG ANGIO  Result Date: 08/26/2022 INDICATION: 88 year old female with past medical history significant hypertension who presented to [facility] with shortness of breath and left-sided weakness, NIHSS 6. Her last known well was 10 p.m. on 08/25/2022. Head CT showed no acute infarct (ASPECTS 10). CT angiogram of the head and neck showed an occlusion of a proximal right M2/MCA posterior middle branch. She did not received thrombolytic as she was outside the window. She was transferred to [facility] for mechanical thrombectomy. A repeat head CT was obtained, however, the study was significantly degraded by motion. Decision made to proceed with mechanical thrombectomy. EXAM: ULTRASOUND-GUIDED VASCULAR ACCESS DIAGNOSTIC CEREBRAL ANGIOGRAM MECHANICAL THROMBECTOMY FLAT PANEL HEAD CT COMPARISON:  CT/CT angiogram of the head and neck August 26, 2022. MEDICATIONS: No antibiotics administered. ANESTHESIA/SEDATION: The procedure was performed under general anesthesia. CONTRAST:  50 mL of Omnipaque 300 milligram/mL. FLUOROSCOPY: Radiation Exposure Index (as provided by the fluoroscopic device): 614 mGy Kerma COMPLICATIONS: None immediate. TECHNIQUE: Informed written consent was waved due to  inability to locate next of kin or healthcare proxy. Two physician discussion held by myself and Dr. Leonel Ramsay. Decision to proceed with intervention in the best interest of the patient. Maximal Sterile Barrier Technique was utilized including caps, mask, sterile gowns, sterile gloves, sterile drape, hand hygiene and skin antiseptic. A timeout was performed prior to the initiation of the procedure. The right groin was prepped and draped in the usual sterile fashion. Using a micropuncture kit and the modified Seldinger technique, access was gained to the right common femoral artery and an 8 French sheath was placed. Real-time ultrasound guidance was utilized for vascular access including the acquisition of a permanent ultrasound image documenting patency of the accessed vessel. Under fluoroscopy, a Zoom 88 guide catheter was navigated over a 6 [place] Berenstein 2 catheter and a 0.035" Terumo Glidewire into the aortic arch. The catheter was placed into the right common carotid artery and  then advanced into the right internal carotid artery. The diagnostic catheter was removed. Frontal and lateral angiograms of the head were obtained. FINDINGS: 1. Normal caliber of the right common femoral artery, adequate for vascular access. 2. There is an occlusion of a proximal right M2/MCA middle division branch. PROCEDURE: Using biplane roadmap guidance, a Zoom 55 aspiration catheter was navigated over Colossus 35 microguidewire into the cavernous segment of the right ICA. The aspiration catheter was then advanced to the level of occlusion and connected to an aspiration pump. Continuous aspiration was performed for 1.5 minute. The guide catheter was connected to a VacLok syringe. The aspiration catheter was subsequently removed under constant aspiration. The guide catheter was aspirated for debris. Right internal carotid artery angiograms with frontal lateral views of the head showed complete recanalization of the right M2/MCA  branch (TICI 3). Flat panel CT of the head was obtained and post processed in a separate workstation with concurrent attending physician supervision. Selected images were sent to PACS. Minimal contrast staining in the right sylvian fissure is noted. The catheter was subsequently withdrawn. Right common femoral artery angiogram was obtained in right anterior oblique view. The puncture is at the level of the common femoral artery. The artery has mild atherosclerotic changes without significant stenosis, adequate for closure device. The sheath was exchanged over the wire for a Perclose prostyle which was utilized for access closure. Immediate hemostasis was achieved. IMPRESSION: Successful mechanical thrombectomy with direct contact aspiration for treatment of a right M2/MCA proximal occlusion achieving complete recanalization (TICI 3) after 1 pass. No thromboembolic or hemorrhagic complication. PLAN: Transfer to ICU for continued post stroke care. Electronically Signed   By: Pedro Earls M.D.   On: 08/26/2022 11:55   IR US Guide Vasc Access Right  Result Date: 08/26/2022 INDICATION: 88 year old female with past medical history significant hypertension who presented to [facility] with shortness of breath and left-sided weakness, NIHSS 6. Her last known well was 10 p.m. on 08/25/2022. Head CT showed no acute infarct (ASPECTS 10). CT angiogram of the head and neck showed an occlusion of a proximal right M2/MCA posterior middle branch. She did not received thrombolytic as she was outside the window. She was transferred to [facility] for mechanical thrombectomy. A repeat head CT was obtained, however, the study was significantly degraded by motion. Decision made to proceed with mechanical thrombectomy. EXAM: ULTRASOUND-GUIDED VASCULAR ACCESS DIAGNOSTIC CEREBRAL ANGIOGRAM MECHANICAL THROMBECTOMY FLAT PANEL HEAD CT COMPARISON:  CT/CT angiogram of the head and neck August 26, 2022.  MEDICATIONS: No antibiotics administered. ANESTHESIA/SEDATION: The procedure was performed under general anesthesia. CONTRAST:  50 mL of Omnipaque 300 milligram/mL. FLUOROSCOPY: Radiation Exposure Index (as provided by the fluoroscopic device): 161 mGy Kerma COMPLICATIONS: None immediate. TECHNIQUE: Informed written consent was waved due to inability to locate next of kin or healthcare proxy. Two physician discussion held by myself and Dr. Leonel Ramsay. Decision to proceed with intervention in the best interest of the patient. Maximal Sterile Barrier Technique was utilized including caps, mask, sterile gowns, sterile gloves, sterile drape, hand hygiene and skin antiseptic. A timeout was performed prior to the initiation of the procedure. The right groin was prepped and draped in the usual sterile fashion. Using a micropuncture kit and the modified Seldinger technique, access was gained to the right common femoral artery and an 8 French sheath was placed. Real-time ultrasound guidance was utilized for vascular access including the acquisition of a permanent ultrasound image documenting patency of the accessed vessel. Under  fluoroscopy, a Zoom 88 guide catheter was navigated over a 6 [place] Berenstein 2 catheter and a 0.035" Terumo Glidewire into the aortic arch. The catheter was placed into the right common carotid artery and then advanced into the right internal carotid artery. The diagnostic catheter was removed. Frontal and lateral angiograms of the head were obtained. FINDINGS: 1. Normal caliber of the right common femoral artery, adequate for vascular access. 2. There is an occlusion of a proximal right M2/MCA middle division branch. PROCEDURE: Using biplane roadmap guidance, a Zoom 55 aspiration catheter was navigated over Colossus 35 microguidewire into the cavernous segment of the right ICA. The aspiration catheter was then advanced to the level of occlusion and connected to an aspiration pump. Continuous  aspiration was performed for 1.5 minute. The guide catheter was connected to a VacLok syringe. The aspiration catheter was subsequently removed under constant aspiration. The guide catheter was aspirated for debris. Right internal carotid artery angiograms with frontal lateral views of the head showed complete recanalization of the right M2/MCA branch (TICI 3). Flat panel CT of the head was obtained and post processed in a separate workstation with concurrent attending physician supervision. Selected images were sent to PACS. Minimal contrast staining in the right sylvian fissure is noted. The catheter was subsequently withdrawn. Right common femoral artery angiogram was obtained in right anterior oblique view. The puncture is at the level of the common femoral artery. The artery has mild atherosclerotic changes without significant stenosis, adequate for closure device. The sheath was exchanged over the wire for a Perclose prostyle which was utilized for access closure. Immediate hemostasis was achieved. IMPRESSION: Successful mechanical thrombectomy with direct contact aspiration for treatment of a right M2/MCA proximal occlusion achieving complete recanalization (TICI 3) after 1 pass. No thromboembolic or hemorrhagic complication. PLAN: Transfer to ICU for continued post stroke care. Electronically Signed   By: Pedro Earls M.D.   On: 08/26/2022 11:55   IR CT Head Ltd  Result Date: 08/26/2022 INDICATION: 88 year old female with past medical history significant hypertension who presented to [facility] with shortness of breath and left-sided weakness, NIHSS 6. Her last known well was 10 p.m. on 08/25/2022. Head CT showed no acute infarct (ASPECTS 10). CT angiogram of the head and neck showed an occlusion of a proximal right M2/MCA posterior middle branch. She did not received thrombolytic as she was outside the window. She was transferred to [facility] for mechanical thrombectomy. A  repeat head CT was obtained, however, the study was significantly degraded by motion. Decision made to proceed with mechanical thrombectomy. EXAM: ULTRASOUND-GUIDED VASCULAR ACCESS DIAGNOSTIC CEREBRAL ANGIOGRAM MECHANICAL THROMBECTOMY FLAT PANEL HEAD CT COMPARISON:  CT/CT angiogram of the head and neck August 26, 2022. MEDICATIONS: No antibiotics administered. ANESTHESIA/SEDATION: The procedure was performed under general anesthesia. CONTRAST:  50 mL of Omnipaque 300 milligram/mL. FLUOROSCOPY: Radiation Exposure Index (as provided by the fluoroscopic device): 540 mGy Kerma COMPLICATIONS: None immediate. TECHNIQUE: Informed written consent was waved due to inability to locate next of kin or healthcare proxy. Two physician discussion held by myself and Dr. Leonel Ramsay. Decision to proceed with intervention in the best interest of the patient. Maximal Sterile Barrier Technique was utilized including caps, mask, sterile gowns, sterile gloves, sterile drape, hand hygiene and skin antiseptic. A timeout was performed prior to the initiation of the procedure. The right groin was prepped and draped in the usual sterile fashion. Using a micropuncture kit and the modified Seldinger technique, access was gained to the right  common femoral artery and an 8 French sheath was placed. Real-time ultrasound guidance was utilized for vascular access including the acquisition of a permanent ultrasound image documenting patency of the accessed vessel. Under fluoroscopy, a Zoom 88 guide catheter was navigated over a 6 [place] Berenstein 2 catheter and a 0.035" Terumo Glidewire into the aortic arch. The catheter was placed into the right common carotid artery and then advanced into the right internal carotid artery. The diagnostic catheter was removed. Frontal and lateral angiograms of the head were obtained. FINDINGS: 1. Normal caliber of the right common femoral artery, adequate for vascular access. 2. There is an occlusion of a  proximal right M2/MCA middle division branch. PROCEDURE: Using biplane roadmap guidance, a Zoom 55 aspiration catheter was navigated over Colossus 35 microguidewire into the cavernous segment of the right ICA. The aspiration catheter was then advanced to the level of occlusion and connected to an aspiration pump. Continuous aspiration was performed for 1.5 minute. The guide catheter was connected to a VacLok syringe. The aspiration catheter was subsequently removed under constant aspiration. The guide catheter was aspirated for debris. Right internal carotid artery angiograms with frontal lateral views of the head showed complete recanalization of the right M2/MCA branch (TICI 3). Flat panel CT of the head was obtained and post processed in a separate workstation with concurrent attending physician supervision. Selected images were sent to PACS. Minimal contrast staining in the right sylvian fissure is noted. The catheter was subsequently withdrawn. Right common femoral artery angiogram was obtained in right anterior oblique view. The puncture is at the level of the common femoral artery. The artery has mild atherosclerotic changes without significant stenosis, adequate for closure device. The sheath was exchanged over the wire for a Perclose prostyle which was utilized for access closure. Immediate hemostasis was achieved. IMPRESSION: Successful mechanical thrombectomy with direct contact aspiration for treatment of a right M2/MCA proximal occlusion achieving complete recanalization (TICI 3) after 1 pass. No thromboembolic or hemorrhagic complication. PLAN: Transfer to ICU for continued post stroke care. Electronically Signed   By: Pedro Earls M.D.   On: 08/26/2022 11:55   CT HEAD CODE STROKE WO CONTRAST  Result Date: 08/26/2022 CLINICAL DATA:  Code stroke.  Left arm weakness EXAM: CT HEAD WITHOUT CONTRAST TECHNIQUE: Contiguous axial images were obtained from the base of the skull through the  vertex without intravenous contrast. RADIATION DOSE REDUCTION: This exam was performed according to the departmental dose-optimization program which includes automated exposure control, adjustment of the mA and/or kV according to patient size and/or use of iterative reconstruction technique. COMPARISON:  Same day CT head. FINDINGS: Image quality is significantly degraded by motion and streak artifact. Brain: There is hypodensity with loss of gray-white differentiation in the right insula and temporal lobe suspicious for early MCA infarct given finding on same-day CTA. There is no evidence of hemorrhagic transformation. There is no other evidence of evolved infarct. The basal ganglia appear preserved. There is no acute intracranial hemorrhage or extra-axial fluid collection. Background parenchymal volume is normal. The ventricles are stable in size. There is no mass lesion. There is no mass effect or midline shift. Vascular: There is hyperdensity in the right M2 vessel corresponding to the occlusion seen on the same-day CTA. Skull: Normal. Negative for fracture or focal lesion. Sinuses/Orbits: The paranasal sinuses are clear. Bilateral lens implants are in place. The globes and orbits are otherwise unremarkable. Other: None. ASPECTS [facility] Stroke Program Early CT Score) - Ganglionic level infarction (caudate,  lentiform nuclei, internal capsule, insula, M1-M3 cortex): 5 - Supraganglionic infarction (M4-M6 cortex): 3 Total score (0-10 with 10 being normal): 8 IMPRESSION: 1. Hypodensity in the right insula and temporal lobe likely reflecting evolving infarct in the right MCA distribution given finding on earlier CTA, though evaluation is degraded by motion and streak artifact. Aspects is 8. 2. No acute intracranial hemorrhage. Electronically Signed   By: Valetta Mole M.D.   On: 08/26/2022 09:29   DG Chest Portable 1 View  Result Date: 08/26/2022 CLINICAL DATA:  Shortness of breath. EXAM: PORTABLE CHEST 1 VIEW  COMPARISON:  August 27, 2017. FINDINGS: Stable cardiomegaly. Status post right total shoulder arthroplasty. Mild bilateral perihilar and basilar interstitial densities are noted concerning for edema or possible atelectasis. IMPRESSION: Mild bilateral perihilar and basilar interstitial densities are noted concerning for edema or possibly atelectasis. Aortic Atherosclerosis (ICD10-I70.0). Electronically Signed   By: Marijo Conception M.D.   On: 08/26/2022 08:27   CT ANGIO HEAD NECK W WO CM W PERF  Result Date: 08/26/2022 CLINICAL DATA:  Stroke follow up EXAM: CT ANGIOGRAPHY HEAD AND NECK CT PERFUSION BRAIN TECHNIQUE: Multidetector CT imaging of the head and neck was performed using the standard protocol during bolus administration of intravenous contrast. Multiplanar CT image reconstructions and MIPs were obtained to evaluate the vascular anatomy. Carotid stenosis measurements (when applicable) are obtained utilizing NASCET criteria, using the distal internal carotid diameter as the denominator. Multiphase CT imaging of the brain was performed following IV bolus contrast injection. Subsequent parametric perfusion maps were calculated using RAPID software. RADIATION DOSE REDUCTION: This exam was performed according to the departmental dose-optimization program which includes automated exposure control, adjustment of the mA and/or kV according to patient size and/or use of iterative reconstruction technique. CONTRAST:  141m OMNIPAQUE IOHEXOL 350 MG/ML SOLN COMPARISON:  CT Chest 08/27/17 FINDINGS: CT HEAD FINDINGS See same day CT brain for intracranial findings. No contrast-enhancing lesion is visualized. Note that assessment contrast enhancement is limited due to timing of the contrast bolus. Bilateral lens replacements. No significant paranasal sinus disease. Presence of IV contrast markedly limits the ability to assess for intracranial hemorrhage. Review of the MIP images confirms the above findings CTA NECK  FINDINGS Aortic arch: Standard branching. Imaged portion shows no evidence of aneurysm or dissection. No significant stenosis of the major arch vessel origins. Right carotid system: There is atherosclerotic plaque at the carotid bifurcation that results in mild narrowing of the origin of the right ICA. Left carotid system: There is atherosclerotic plaque at the carotid bifurcation that results in mild narrowing at the origin of the left ICA. Vertebral arteries: There is mild to moderate narrowing of the V4 segment of the left vertebral artery. Right dominant system. Skeleton: Multilevel degenerative changes with degenerative pannus at C1-C2. Compared to prior exam from 2018 there is also a severe compression deformity at T5 with near-complete collapse of the vertebral body. Other neck: Negative. Upper chest: Small bilateral pleural effusions. The esophagus is patulous. Ground-glass appearance bilateral lung apices may be related to expiratory phase of imaging. Review of the MIP images confirms the above findings CTA HEAD FINDINGS Anterior circulation: Acute occlusion of a proximal right M2 segment (series 7, image 105). Bilateral A1 and A2 segments are normal. The left M1 and M2 segments are normal. Posterior circulation: Fetal type PCA on the right with the right P2 segment predominantly supplied by the right PCOM. Venous sinuses: As permitted by contrast timing, patent. Anatomic variants: Fetal PCA on the right.  Review of the MIP images confirms the above findings CT Brain Perfusion Findings: ASPECTS: 10 CBF (<30%) Volume: 25m Perfusion (Tmax>6.0s) volume: 454mMismatch Volume: 4883mnfarction Location:No infarct. IMPRESSION: 1. Acute occlusion of a proximal right M2 segment. 2. CT perfusion demonstrates no core infarct, but there is a mismatch volume of 48 mL in the right MCA territory. 3. Severe compression deformity at T5 with near-complete collapse of the vertebral body. This is age indeterminate, but new  compared to 2018. 4. Small bilateral pleural effusions and mild pulmonary edema. Findings were discussed with Dr. DixDoren Custard 08/26/22 at 8:04 AM. Electronically Signed   By: HemMarin RobertsD.   On: 08/26/2022 08:20   CT HEAD WO CONTRAST  Result Date: 08/26/2022 CLINICAL DATA:  Left arm weakness EXAM: CT HEAD WITHOUT CONTRAST TECHNIQUE: Contiguous axial images were obtained from the base of the skull through the vertex without intravenous contrast. RADIATION DOSE REDUCTION: This exam was performed according to the departmental dose-optimization program which includes automated exposure control, adjustment of the mA and/or kV according to patient size and/or use of iterative reconstruction technique. COMPARISON:  None Available. FINDINGS: Brain: No evidence of acute infarction, hemorrhage, hydrocephalus, extra-axial collection or mass lesion/mass effect. Patchy low-density in the cerebral white matter attributed to chronic small vessel ischemia. Low-density areas in the right thalamus and right pons appear discrete on reformats and are attributed to chronic lacune. Age normal brain volume Vascular: No hyperdense vessel or unexpected calcification. Extensive atheromatous calcification Skull: Negative Sinuses/Orbits: Negative Other: Prelim sent in epic chat IMPRESSION: 1. No acute finding.  ASPECTS is 10. 2. Chronic small vessel ischemia. Electronically Signed   By: JonJorje GuildD.   On: 08/26/2022 06:27   DG Abd 1 View  Result Date: 08/21/2022 CLINICAL DATA:  Lower abdominal pain EXAM: ABDOMEN - 1 VIEW COMPARISON:  None Available. FINDINGS: Right upper quadrant surgical clips. Nonobstructive bowel gas pattern. No free air or pneumatosis. No abnormal radio-opaque calculi or mass effect. Sequela of prior right pubic ramus fractures. IMPRESSION: 1. Nonobstructive bowel gas pattern. 2. Sequela of prior right pubic ramus fractures. Electronically Signed   By: LimDarrin NipperD.   On: 08/21/2022 12:27       HISTORY OF PRESENT ILLNESS Patient with history of hypertension and CKD stage III presented with difficulty moving left side, left facial droop and shortness of breath.  HOSPITAL COURSE Patient was found to have an acute stroke with right M2 occlusion and was taken for to IR for mechanical thrombectomy.  Procedure was successful.  Afterwards, she was noted to be in A-fib with RVR, and Eliquis and amiodarone were started. Stroke:  right MCA stroke status post mechanical thrombectomy Etiology: Cardioembolic in the setting of atrial fibrillation Code Stroke CT head hypodensity in right insula and temporal lobe ASPECTS 8   CTA head & neck acute occlusion of proximal right M2 CT perfusion no core infarct and 48 mL penumbra in right MCA territory MRI multiple small acute infarcts in right insula and frontal lobe 2D Echo EF 50 to 55%01%rade 2 diastolic dyspnea function, severely dilated left atrium, no atrial level shunt  LDL 67 HgbA1c 5.6 VTE prophylaxis -fully anticoagulated with Eliquis       Diet    Diet regular Room service appropriate? Yes; Fluid consistency: Thin        No antithrombotic prior to admission, now on aspirin 81 mg daily and Eliquis (apixaban) daily.  Therapy recommendations: Home health PT Disposition: Pending   Hypertension  Home meds:  none Stable Keep systolic blood pressure under 160 Long-term BP goal normotensive   Hyperlipidemia Home meds: None LDL 67, goal < 70 High intensity statin not indicated as LDL below goal Continue statin at discharge   Atrial fibrillation Patient was noted to be in A-fib with RVR We will begin anticoagulation with Eliquis   Other Stroke Risk Factors Advanced Age >/= 72    DISCHARGE EXAM Blood pressure (!) 156/60, pulse 60, temperature 98 F (36.7 C), temperature source Oral, resp. rate 19, height _0  (1.626 m), weight 76 kg, SpO2 98 %. PHYSICAL EXAM General: Alert, well-developed, well-nourished elderly patient in no  acute distress. Sitting up in a chair with family surrounding her. Respiratory: Regular, unlabored respirations on room air   NEURO:  Mental Status: AA&Ox3  Speech/Language: speech is without dysarthria or aphasia.  Naming, fluency, and comprehension intact.   Cranial Nerves:  II: PERRL.  III, IV, VI: EOMI. Eyelids elevate symmetrically.  V: Sensation is intact to light touch and symmetrical to face.  VII: Smile is symmetrical.   VIII: hearing intact to voice. IX, X: Phonation is normal.  XII: tongue is midline without fasciculations. Motor: 5/5 strength to all muscle groups tested.  Tone: is normal and bulk is normal Sensation- Intact to light touch bilaterally.   Coordination: FTN intact bilaterally Gait- deferred  Discharge Diet       Diet   Diet regular Room service appropriate? Yes; Fluid consistency: Thin   liquids  DISCHARGE PLAN Disposition:  Home with home health Eliquis (apixaban) daily for secondary stroke prevention Ongoing stroke risk factor control by Primary Care Physician at time of discharge Follow-up PCP Asencion Noble, MD in 2 weeks. Follow-up in [facility] in 8 weeks, office to schedule an appointment.  Follow up with [facility] outpatient in 2-4 weeks  30 minutes were spent preparing discharge.  Patient seen and examined by NP/APP with MD. MD to update note as needed.   Janine Ores, DNP, FNP-BC [facility] Pager: [phone]  ATTENDING ATTESTATION: Transition to p.o. amiodarone by cardiology today.  No issues overnight with telemetry.  She is doing well with no deficits on exam.  Multiple family members in the room were updated anticipating discharge.  Dr. Reeves Forth evaluated pt independently, reviewed imaging, chart, labs. Discussed and formulated plan with the Resident/APP. Changes were made to the note where appropriate. Please see APP/resident note above for details.    Gaurang Palikh,MD

## 2022-08-27 NOTE — Progress Notes (Signed)
ANTICOAGULATION CONSULT NOTE - Initial Consult  Pharmacy Consult for Apixaban Indication: atrial fibrillation  No Known Allergies  Patient Measurements: Height: 5\' 4"  (162.6 cm) Weight: 76 kg (167 lb 8.8 oz) IBW/kg (Calculated) : 54.7  Vital Signs: Temp: 98.4 F (36.9 C) (11/21 0819) Temp Source: Axillary (11/21 0819) BP: 132/97 (11/21 0800) Pulse Rate: 156 (11/21 0800)  Labs: Recent Labs    08/26/22 0609 08/26/22 0614 08/26/22 1813 08/26/22 2043 08/27/22 0343  HGB 12.4 13.3 11.2*  --  11.8*  HCT 37.1 39.0 33.0*  --  35.1*  PLT 194  --   --   --  161  APTT 28  --   --   --   --   LABPROT 14.4  --   --   --   --   INR 1.1  --   --   --   --   CREATININE 1.01* 1.00  --  0.94 1.14*    Estimated Creatinine Clearance: 31.4 mL/min (A) (by C-G formula based on SCr of 1.14 mg/dL (H)).   Medical History: Past Medical History:  Diagnosis Date   Arthritis    Hypertension     Assessment: 88 yo female presented on 08/26/22 with stroke. Pharmacy consulted for apixaban for afib per neurology. Age >80, Scr <1.5, wt > 60kg.    Goal of Therapy:  Monitor platelets by anticoagulation protocol: Yes   Plan:  Start apixaban 5mg   Pharmacy to provide education prior to discharge   08/28/22, PharmD, BCPS Clinical Pharmacist 08/27/2022 11:17 AM

## 2022-08-27 NOTE — Progress Notes (Signed)
OT Cancellation Note  Patient Details Name: Sheila Jordan MRN: [id number] DOB: 01-02-1928   Cancelled Treatment:    Reason Eval/Treat Not Completed: Patient not medically ready (plans to extubate this am. Will follow up later time.)  [facility] 08/27/2022, 9:01 AM Luisa Dago, OT/L   Acute OT Clinical Specialist Acute Rehabilitation Services Pager [phone] Office [phone]

## 2022-08-27 NOTE — Progress Notes (Signed)
[facility] ADULT ICU REPLACEMENT PROTOCOL   The patient does apply for the [facility] Adult ICU Electrolyte Replacment Protocol based on the criteria listed below:   1.Exclusion criteria: TCTS, ECMO, Dialysis, and Myasthenia Gravis patients 2. Is GFR >/= 30 ml/min? Yes.    Patient's GFR today is 45 3. Is SCr </= 2? Yes.   Patient's SCr is 1.14 mg/dL 4. Did SCr increase >/= 0.5 in 24 hours? No. 5.Pt's weight >40kg  Yes.   6. Abnormal electrolyte(s): K+ 3.7  7. Electrolytes replaced per protocol 8.  Call MD STAT for K+ </= 2.5, Phos </= 1, or Mag </= 1 Physician:  n/a  Melvern Banker 08/27/2022 4:54 AM

## 2022-08-28 ENCOUNTER — Other Ambulatory Visit (HOSPITAL_COMMUNITY): Payer: Self-pay

## 2022-08-28 DIAGNOSIS — I480 Paroxysmal atrial fibrillation: Secondary | ICD-10-CM

## 2022-08-28 DIAGNOSIS — I63411 Cerebral infarction due to embolism of right middle cerebral artery: Secondary | ICD-10-CM | POA: Diagnosis not present

## 2022-08-28 MED ORDER — AMIODARONE HCL IN DEXTROSE 360-4.14 MG/200ML-% IV SOLN
60.0000 mg/h | INTRAVENOUS | Status: AC
  Administered 2022-08-28 (×2): 60 mg/h via INTRAVENOUS
  Filled 2022-08-28: qty 200

## 2022-08-28 MED ORDER — METOPROLOL TARTRATE 5 MG/5ML IV SOLN
5.0000 mg | Freq: Once | INTRAVENOUS | Status: AC
  Administered 2022-08-28: 5 mg via INTRAVENOUS
  Filled 2022-08-28: qty 5

## 2022-08-28 MED ORDER — ASPIRIN 81 MG PO TBEC: 81.0000 mg | DELAYED_RELEASE_TABLET | Freq: Every day | ORAL | 12 refills | Status: DC

## 2022-08-28 MED ORDER — APIXABAN 5 MG PO TABS: 5.0000 mg | ORAL_TABLET | Freq: Two times a day (BID) | ORAL | 1 refills | Status: DC

## 2022-08-28 MED ORDER — METOPROLOL TARTRATE 25 MG PO TABS: 25.0000 mg | ORAL_TABLET | Freq: Two times a day (BID) | ORAL | Status: DC

## 2022-08-28 MED ORDER — IPRATROPIUM-ALBUTEROL 0.5-2.5 (3) MG/3ML IN SOLN
3.0000 mL | Freq: Four times a day (QID) | RESPIRATORY_TRACT | Status: DC | PRN
  Administered 2022-08-28: 3 mL via RESPIRATORY_TRACT
  Filled 2022-08-28: qty 3

## 2022-08-28 MED ORDER — METOPROLOL TARTRATE 25 MG PO TABS: 25.0000 mg | ORAL_TABLET | Freq: Two times a day (BID) | ORAL | 1 refills | Status: DC

## 2022-08-28 MED ORDER — AMIODARONE HCL IN DEXTROSE 360-4.14 MG/200ML-% IV SOLN
30.0000 mg/h | INTRAVENOUS | Status: DC
  Administered 2022-08-28: 30 mg/h via INTRAVENOUS
  Filled 2022-08-28 (×2): qty 200

## 2022-08-28 MED ORDER — APIXABAN 5 MG PO TABS
5.0000 mg | ORAL_TABLET | Freq: Two times a day (BID) | ORAL | 1 refills | Status: DC
  Filled 2022-08-28 – 2022-09-26 (×2): qty 60, 30d supply, fill #0

## 2022-08-28 MED ORDER — AMIODARONE LOAD VIA INFUSION
150.0000 mg | Freq: Once | INTRAVENOUS | Status: AC
  Administered 2022-08-28: 150 mg via INTRAVENOUS
  Filled 2022-08-28: qty 83.34

## 2022-08-28 NOTE — Consult Note (Addendum)
Cardiology Consultation   Patient ID: Sheila Jordan MRN: [id number]; DOB: May 10, 1928  Admit date: 08/26/2022 Date of Consult: 08/28/2022  PCP:  Asencion Noble, [place] [facility] Cardiologist:  None        Patient Profile:   Sheila Jordan is a 88 y.o. female with a hx of hypertension and arthritis who is being seen 08/28/2022 for the evaluation of afib with RVR in the setting of acute CVA at the request of Minor, NP with neurology.  History of Present Illness:   [name] is a 88 year old female with relatively little medical history. He presented to [facility] on 11/20 after awakening around 4am that day with difficulty moving her left side as well as shortness of breath. Last known normal was when she went to sleep around 10pm. Teleneurology was consulted and CT/CTP demonstrated M2 occlusion. She was emergently transferred to [facility] and a thrombectomy was successfully completed. Peri-operatively/post-operatively, patient noted with afib and RVR. She received Amiodarone 125m bolus and converted to NSR shortly after infusion was initiated. Anticoagulation was deferred at that time due to short duration. On 11/21, patient was discontinued from Amiodarone, transitioned to Metoprolol Tartrate PO. She was also initiated on Eliquis 55mBID.   A review of telemetry shows paroxysmal afib with RVR intermittently throughout this admission, persistent since early this morning. On my exam, patient is pleasant and denies any symptoms of palpitations, chest pain, abnormal heart rhythm either today, at any other point in this admission, or prior to admission. She denies any cardiac history and has never seen a cardiologist before.    Past Medical History:  Diagnosis Date   Arthritis    Hypertension     Past Surgical History:  Procedure Laterality Date   APPENDECTOMY     CHOLECYSTECTOMY     IR CT HEAD LTD  08/26/2022   IR PERCUTANEOUS ART THROMBECTOMY/INFUSION  INTRACRANIAL INC DIAG ANGIO  08/26/2022   IR [place]IDE VASC ACCESS RIGHT  08/26/2022   RADIOLOGY WITH ANESTHESIA N/A 08/26/2022   Procedure: RADIOLOGY WITH ANESTHESIA;  Surgeon: Radiologist, Medication, MD;  [place] [facility]: Radiology;  Laterality: N/A;   REVERSE SHOULDER ARTHROPLASTY Right 06/19/2020   Procedure: REVERSE TOTAL SHOULDER ARTHROPLASTY;  Surgeon: VaHiram GashMD;  [place] [facility] Service: Orthopedics;  Laterality: Right;     Home Medications:  Prior to Admission medications   Medication Sig Start Date End Date Taking? Authorizing Provider  alum & mag hydroxide-simeth (MYLANTA MAXIMUM STRENGTH) 400-400-40 MG/5ML suspension Take 15 mLs by mouth every 6 (six) hours as needed for indigestion. 08/21/22  Yes Leath-Warren, ChAlda LeaNP  docusate sodium (COLACE) 100 MG capsule Take 100 mg by mouth daily as needed for mild constipation.   Yes [provider]  ferrous sulfate 325 (65 FE) MG EC tablet Take 1 tablet (325 mg total) by mouth 2 (two) times daily. 06/21/20 08/27/22 Yes KcAntonieta PertMD  Multiple Vitamin (MULTIVITAMIN) tablet Take 1 tablet by mouth daily.   Yes [provider]  omeprazole (PRILOSEC) 20 MG capsule Take 1 capsule (20 mg total) by mouth daily. 08/21/22  Yes Leath-Warren, ChAlda LeaNP  apixaban (ELIQUIS) 5 MG TABS tablet Take 1 tablet (5 mg total) by mouth 2 (two) times daily. 08/28/22   ShJanine OresNP  aspirin EC 81 MG tablet Take 1 tablet (81 mg total) by mouth daily. Swallow whole. 08/29/22   ShJanine OresNP  metoprolol tartrate (  LOPRESSOR) 25 MG tablet Take 1 tablet (25 mg total) by mouth 2 (two) times daily. 08/28/22   Janine Ores, NP    Inpatient Medications: Scheduled Meds:   stroke: early stages of recovery book   Does not apply Once   amiodarone  150 mg Intravenous Once   apixaban  5 mg Oral BID   aspirin EC  81 mg Oral Daily   Chlorhexidine Gluconate Cloth  6 each Topical Daily   metoprolol tartrate  25 mg Oral  BID   Continuous Infusions:  sodium chloride 75 mL/hr at 08/28/22 0700   amiodarone     Followed by   amiodarone     PRN Meds: acetaminophen **OR** acetaminophen (TYLENOL) oral liquid 160 mg/5 mL **OR** acetaminophen, iohexol, ipratropium-albuterol, ondansetron (ZOFRAN) IV, mouth rinse  Allergies:   No Known Allergies  Social History:   Social History   Socioeconomic History   Marital status: Widowed    Spouse name: Not on file   Number of children: Not on file   Years of education: Not on file   Highest education level: Not on file  Occupational History   Not on file  Tobacco Use   Smoking status: Never   Smokeless tobacco: Never  Vaping Use   Vaping Use: Never used  Substance and Sexual Activity   Alcohol use: No   Drug use: No   Sexual activity: Not Currently    Birth control/protection: Post-menopausal  Other Topics Concern   Not on file  Social History Narrative   Not on file   Social Determinants of Health   Financial Resource Strain: Not on file  Food Insecurity: No Food Insecurity (08/27/2022)   Hunger Vital Sign    Worried About Running Out of Food in the Last Year: Never true    Ran Out of Food in the Last Year: Never true  Transportation Needs: No Transportation Needs (08/27/2022)   PRAPARE - Hydrologist (Medical): No    Lack of Transportation (Non-Medical): No  Physical Activity: Not on file  Stress: Not on file  Social Connections: Not on file  Intimate Partner Violence: Not At Risk (08/27/2022)   Humiliation, Afraid, Rape, and Kick questionnaire    Fear of Current or Ex-Partner: No    Emotionally Abused: No    Physically Abused: No    Sexually Abused: No    Family History:    Family History  Problem Relation Age of Onset   Other Father        heart condition   Parkinson's disease Mother    Breast cancer Daughter      ROS:  Please see the history of present illness.   All other ROS reviewed and  negative.     Physical Exam/Data:   Vitals:   08/28/22 1042 08/28/22 1100 08/28/22 1207 08/28/22 1300  BP:  129/78 129/86 108/87  Pulse:  (!) 129 (!) 130 (!) 132  Resp:  (!) _0 Temp:   98.3 F (36.8 C)   TempSrc:   Oral   SpO2: 95% 97% 96% 100%  Weight:      Height:        Intake/Output Summary (Last 24 hours) at 08/28/2022 1339 Last data filed at 08/28/2022 0700 Gross per 24 hour  Intake 1360.98 ml  Output 575 ml  Net 785.98 ml      08/26/2022    6:13 AM 06/21/2020    5:00 AM 06/20/2020    5:48  AM  Last 3 Weights  Weight (lbs) 167 lb 8.8 oz 168 lb 6.9 oz 178 lb 12.7 oz  Weight (kg) 76 kg 76.4 kg 81.1 kg     Body mass index is 28.76 kg/m.  General:  Well nourished, well developed, in no acute distress HEENT: normal Neck: no JVD Vascular: No carotid bruits; Distal pulses 2+ bilaterally Cardiac:  normal S1, S2; irregularly irregular and rapid; no murmur  Lungs:  clear to auscultation bilaterally, no wheezing, rhonchi or rales  Abd: soft, nontender, no hepatomegaly  Ext: no edema Musculoskeletal:  No deformities, BUE and BLE strength normal and equal Skin: warm and dry  Neuro:  CNs 2-12 intact, no focal abnormalities noted Psych:  Normal affect   EKG:  No tracing since 11/20. That tracing shows afib with RVR, ventricular rates in the 140s. Telemetry:  Telemetry was personally reviewed and demonstrates:  atrial fibrillation with RVR, rates 100s-140s. Brief period of sinus rhythm this morning.   Relevant CV Studies:  08/26/22 TTE  IMPRESSIONS     1. Left ventricular ejection fraction, by estimation, is 50 to 55%. The  left ventricle has low normal function. The left ventricle has no regional  wall motion abnormalities. Left ventricular diastolic parameters are  consistent with Grade II diastolic  dysfunction (pseudonormalization). Elevated left ventricular end-diastolic  pressure.   2. Right ventricular systolic function is normal. The right ventricular   size is normal. There is mildly elevated pulmonary artery systolic  pressure.   3. Left atrial size was severely dilated.   4. The mitral valve is normal in structure. Mild mitral valve  regurgitation. No evidence of mitral stenosis.   5. The aortic valve is tricuspid. Aortic valve regurgitation is not  visualized. No aortic stenosis is present.   6. Pulmonic valve regurgitation is moderate.   7. The inferior vena cava is normal in size with greater than 50%  respiratory variability, suggesting right atrial pressure of 3 mmHg.   FINDINGS   Left Ventricle: Left ventricular ejection fraction, by estimation, is 50  to 55%. The left ventricle has low normal function. The left ventricle has  no regional wall motion abnormalities. The left ventricular internal  cavity size was normal in size.  There is no left ventricular hypertrophy. Left ventricular diastolic  parameters are consistent with Grade II diastolic dysfunction  (pseudonormalization). Elevated left ventricular end-diastolic pressure.   Right Ventricle: The right ventricular size is normal. No increase in  right ventricular wall thickness. Right ventricular systolic function is  normal. There is mildly elevated pulmonary artery systolic pressure. The  tricuspid regurgitant velocity is 2.57   m/s, and with an assumed right atrial pressure of 15 mmHg, the estimated  right ventricular systolic pressure is 21.1 mmHg.   Left Atrium: Left atrial size was severely dilated.   Right Atrium: Right atrial size was normal in size.   Pericardium: There is no evidence of pericardial effusion.   Mitral Valve: The mitral valve is normal in structure. Mild mitral annular  calcification. Mild mitral valve regurgitation. No evidence of mitral  valve stenosis.   Tricuspid Valve: The tricuspid valve is normal in structure. Tricuspid  valve regurgitation is mild . No evidence of tricuspid stenosis.   Aortic Valve: The aortic valve is  tricuspid. Aortic valve regurgitation is  not visualized. No aortic stenosis is present.   Pulmonic Valve: The pulmonic valve was normal in structure. Pulmonic valve  regurgitation is moderate. No evidence of pulmonic stenosis.   Aorta:  The aortic root is normal in size and structure.   Venous: The inferior vena cava is normal in size with greater than 50%  respiratory variability, suggesting right atrial pressure of 3 mmHg.   IAS/Shunts: No atrial level shunt detected by color flow Doppler.   Laboratory Data:  High Sensitivity Troponin:  No results for input(s): "TROPONINIHS" in the last 720 hours.   Chemistry Recent Labs  Lab 08/26/22 0609 08/26/22 0614 08/26/22 1813 08/26/22 2043 08/27/22 0343  NA 140 142 140 142 140  K 3.7 3.7 3.7 3.8 3.7  CL 112* 109  --  115* 108  CO2 21*  --   --  19* 20*  GLUCOSE 122* 120*  --  121* 118*  BUN 17 17  --  12 13  CREATININE 1.01* 1.00  --  0.94 1.14*  CALCIUM 8.6*  --   --  7.2* 8.5*  MG  --   --   --  1.7 2.4  GFRNONAA 52*  --   --  57* 45*  ANIONGAP 7  --   --  8 12    Recent Labs  Lab 08/26/22 0609 08/26/22 2043 08/27/22 0343  PROT 7.0 5.1* 6.0*  ALBUMIN 4.2 3.1* 3.6  AST 38 38 37  ALT 38 43 47*  ALKPHOS 104 98 113  BILITOT 1.1 0.5 0.5   Lipids  Recent Labs  Lab 08/27/22 0343  CHOL 139  TRIG 67  HDL 59  LDLCALC 67  CHOLHDL 2.4    Hematology Recent Labs  Lab 08/26/22 0609 08/26/22 0614 08/26/22 1813 08/27/22 0343  WBC 6.9  --   --  7.3  RBC 4.13  --   --  3.88  HGB 12.4 13.3 11.2* 11.8*  HCT 37.1 39.0 33.0* 35.1*  MCV 89.8  --   --  90.5  MCH 30.0  --   --  30.4  MCHC 33.4  --   --  33.6  RDW 14.0  --   --  14.4  PLT 194  --   --  161   Thyroid No results for input(s): "TSH", "FREET4" in the last 168 hours.  BNPNo results for input(s): "BNP", "PROBNP" in the last 168 hours.  DDimer No results for input(s): "DDIMER" in the last 168 hours.   Radiology/Studies:  DG CHEST PORT 1 VIEW  Result Date:  08/26/2022 CLINICAL DATA:  History of endotracheal tube. EXAM: PORTABLE CHEST 1 VIEW COMPARISON:  Radiograph earlier today FINDINGS: Endotracheal tube tip is at the level of the clavicular heads 4 cm from the carina. Cardiomegaly. Stable mediastinal contours allowing for rotation on the current exam. Aortic atherosclerosis. Improving interstitial opacities from earlier today. Small pleural effusions. Fluid in the right minor fissure. No pneumothorax. IMPRESSION: 1. Endotracheal tube tip at the level of the clavicular heads 4 cm from the carina. 2. Improving interstitial opacities from earlier today. Small pleural effusions. Electronically Signed   By: Keith Rake M.D.   On: 08/26/2022 20:07   MR BRAIN WO CONTRAST  Result Date: 08/26/2022 CLINICAL DATA:  Stroke, follow up EXAM: MRI HEAD WITHOUT CONTRAST TECHNIQUE: Multiplanar, multiecho pulse sequences of the brain and surrounding structures were obtained without intravenous contrast. COMPARISON:  CT head November 30, 23. FINDINGS: Brain: Small acute infarct in the right insula. Many small acute infarcts in the overlying right frontal lobe. No evidence of acute hemorrhage, mass lesion, midline shift or hydrocephalus. Vascular: Major arterial flow voids are maintained at the skull base. Skull and upper  cervical spine: Normal marrow signal. Sinuses/Orbits: Mild paranasal sinus mucosal thickening. No acute orbital findings. Other: Embolized effusions. IMPRESSION: Multiple small acute infarcts in the right insula and overlying right frontal lobe. Electronically Signed   By: Margaretha Sheffield M.D.   On: 08/26/2022 16:55   ECHOCARDIOGRAM COMPLETE  Result Date: 08/26/2022    ECHOCARDIOGRAM REPORT   Patient Name:   SHUNA TABOR Date of Exam: 08/26/2022 Medical Rec #:  [id number]        Height:       64.0 in Accession #:    [id number]       Weight:       167.5 lb Date of Birth:  1928/08/23        BSA:          1.815 m Patient Age:    61 years         BP:            164/77 mmHg Patient Gender: F                HR:           80 bpm. Exam Location:  Inpatient Procedure: 2D Echo, Cardiac Doppler and Color Doppler Indications:    Stroke  History:        Patient has no prior history of Echocardiogram examinations.                 Risk Factors:Hypertension.  Sonographer:    Eartha Inch Referring Phys: Merlín.Osler MCNEILL P KIRKPATRICK  Sonographer Comments: Technically difficult study due to poor echo windows and echo performed with patient supine and on artificial respirator. Image acquisition challenging due to patient body habitus and Image acquisition challenging due to respiratory  motion. IMPRESSIONS  1. Left ventricular ejection fraction, by estimation, is 50 to 55%. The left ventricle has low normal function. The left ventricle has no regional wall motion abnormalities. Left ventricular diastolic parameters are consistent with Grade II diastolic dysfunction (pseudonormalization). Elevated left ventricular end-diastolic pressure.  2. Right ventricular systolic function is normal. The right ventricular size is normal. There is mildly elevated pulmonary artery systolic pressure.  3. Left atrial size was severely dilated.  4. The mitral valve is normal in structure. Mild mitral valve regurgitation. No evidence of mitral stenosis.  5. The aortic valve is tricuspid. Aortic valve regurgitation is not visualized. No aortic stenosis is present.  6. Pulmonic valve regurgitation is moderate.  7. The inferior vena cava is normal in size with greater than 50% respiratory variability, suggesting right atrial pressure of 3 mmHg. FINDINGS  Left Ventricle: Left ventricular ejection fraction, by estimation, is 50 to 55%. The left ventricle has low normal function. The left ventricle has no regional wall motion abnormalities. The left ventricular internal cavity size was normal in size. There is no left ventricular hypertrophy. Left ventricular diastolic parameters are consistent with Grade  II diastolic dysfunction (pseudonormalization). Elevated left ventricular end-diastolic pressure. Right Ventricle: The right ventricular size is normal. No increase in right ventricular wall thickness. Right ventricular systolic function is normal. There is mildly elevated pulmonary artery systolic pressure. The tricuspid regurgitant velocity is 2.57  m/s, and with an assumed right atrial pressure of 15 mmHg, the estimated right ventricular systolic pressure is 25.8 mmHg. Left Atrium: Left atrial size was severely dilated. Right Atrium: Right atrial size was normal in size. Pericardium: There is no evidence of pericardial effusion. Mitral Valve: The mitral valve is normal in structure. Mild mitral annular  calcification. Mild mitral valve regurgitation. No evidence of mitral valve stenosis. Tricuspid Valve: The tricuspid valve is normal in structure. Tricuspid valve regurgitation is mild . No evidence of tricuspid stenosis. Aortic Valve: The aortic valve is tricuspid. Aortic valve regurgitation is not visualized. No aortic stenosis is present. Pulmonic Valve: The pulmonic valve was normal in structure. Pulmonic valve regurgitation is moderate. No evidence of pulmonic stenosis. Aorta: The aortic root is normal in size and structure. Venous: The inferior vena cava is normal in size with greater than 50% respiratory variability, suggesting right atrial pressure of 3 mmHg. IAS/Shunts: No atrial level shunt detected by color flow Doppler.  LEFT VENTRICLE PLAX 2D LVIDd:         3.90 cm   Diastology LVIDs:         2.90 cm   LV e' medial:    7.29 cm/s LV PW:         0.80 cm   LV E/e' medial:  14.5 LV IVS:        0.70 cm   LV e' lateral:   5.77 cm/s LVOT diam:     1.90 cm   LV E/e' lateral: 18.4 LV SV:         57 LV SV Index:   32 LVOT Area:     2.84 cm  RIGHT VENTRICLE RV S prime:     13.90 cm/s TAPSE (M-mode): 1.4 cm LEFT ATRIUM             Index        RIGHT ATRIUM           Index LA diam:        3.30 cm 1.82 cm/m   RA  Area:     11.80 cm LA Vol (A2C):   92.6 ml 51.03 ml/m  RA Volume:   22.80 ml  12.56 ml/m LA Vol (A4C):   83.6 ml 46.07 ml/m LA Biplane Vol: 91.3 ml 50.31 ml/m  AORTIC VALVE             PULMONIC VALVE LVOT Vmax:   85.70 cm/s  PR End Diast Vel: 4.67 msec LVOT Vmean:  64.000 cm/s LVOT VTI:    0.202 m  AORTA Ao Root diam: 2.80 cm Ao Asc diam:  3.20 cm MITRAL VALVE                  TRICUSPID VALVE MV Area (PHT): 3.58 cm       TR Peak grad:   26.4 mmHg MV Decel Time: 212 msec       TR Mean grad:   18.0 mmHg MR Peak grad:    67.6 mmHg    TR Vmax:        257.00 cm/s MR Vmax:         411.00 cm/s  TR Vmean:       204.0 cm/s MR PISA:         0.57 cm MR PISA Eff ROA: 4 mm        SHUNTS MR PISA Radius:  0.30 cm      Systemic VTI:  0.20 m MV E velocity: 106.00 cm/s    Systemic Diam: 1.90 cm MV A velocity: 91.40 cm/s MV E/A ratio:  1.16 Skeet Latch MD Electronically signed by Skeet Latch MD Signature Date/Time: 08/26/2022/4:03:57 PM    Final    IR PERCUTANEOUS ART THROMBECTOMY/INFUSION INTRACRANIAL INC DIAG ANGIO  Result Date: 08/26/2022 INDICATION: 88 year old female with past medical history significant hypertension  who presented to [facility] with shortness of breath and left-sided weakness, NIHSS 6. Her last known well was 10 p.m. on 08/25/2022. Head CT showed no acute infarct (ASPECTS 10). CT angiogram of the head and neck showed an occlusion of a proximal right M2/MCA posterior middle branch. She did not received thrombolytic as she was outside the window. She was transferred to [facility] for mechanical thrombectomy. A repeat head CT was obtained, however, the study was significantly degraded by motion. Decision made to proceed with mechanical thrombectomy. EXAM: ULTRASOUND-GUIDED VASCULAR ACCESS DIAGNOSTIC CEREBRAL ANGIOGRAM MECHANICAL THROMBECTOMY FLAT PANEL HEAD CT COMPARISON:  CT/CT angiogram of the head and neck August 26, 2022. MEDICATIONS: No antibiotics administered.  ANESTHESIA/SEDATION: The procedure was performed under general anesthesia. CONTRAST:  50 mL of Omnipaque 300 milligram/mL. FLUOROSCOPY: Radiation Exposure Index (as provided by the fluoroscopic device): 697 mGy Kerma COMPLICATIONS: None immediate. TECHNIQUE: Informed written consent was waved due to inability to locate next of kin or healthcare proxy. Two physician discussion held by myself and Dr. Leonel Ramsay. Decision to proceed with intervention in the best interest of the patient. Maximal Sterile Barrier Technique was utilized including caps, mask, sterile gowns, sterile gloves, sterile drape, hand hygiene and skin antiseptic. A timeout was performed prior to the initiation of the procedure. The right groin was prepped and draped in the usual sterile fashion. Using a micropuncture kit and the modified Seldinger technique, access was gained to the right common femoral artery and an 8 French sheath was placed. Real-time ultrasound guidance was utilized for vascular access including the acquisition of a permanent ultrasound image documenting patency of the accessed vessel. Under fluoroscopy, a Zoom 88 guide catheter was navigated over a 6 [place] Berenstein 2 catheter and a 0.035" Terumo Glidewire into the aortic arch. The catheter was placed into the right common carotid artery and then advanced into the right internal carotid artery. The diagnostic catheter was removed. Frontal and lateral angiograms of the head were obtained. FINDINGS: 1. Normal caliber of the right common femoral artery, adequate for vascular access. 2. There is an occlusion of a proximal right M2/MCA middle division branch. PROCEDURE: Using biplane roadmap guidance, a Zoom 55 aspiration catheter was navigated over Colossus 35 microguidewire into the cavernous segment of the right ICA. The aspiration catheter was then advanced to the level of occlusion and connected to an aspiration pump. Continuous aspiration was performed for 1.5 minute. The  guide catheter was connected to a VacLok syringe. The aspiration catheter was subsequently removed under constant aspiration. The guide catheter was aspirated for debris. Right internal carotid artery angiograms with frontal lateral views of the head showed complete recanalization of the right M2/MCA branch (TICI 3). Flat panel CT of the head was obtained and post processed in a separate workstation with concurrent attending physician supervision. Selected images were sent to PACS. Minimal contrast staining in the right sylvian fissure is noted. The catheter was subsequently withdrawn. Right common femoral artery angiogram was obtained in right anterior oblique view. The puncture is at the level of the common femoral artery. The artery has mild atherosclerotic changes without significant stenosis, adequate for closure device. The sheath was exchanged over the wire for a Perclose prostyle which was utilized for access closure. Immediate hemostasis was achieved. IMPRESSION: Successful mechanical thrombectomy with direct contact aspiration for treatment of a right M2/MCA proximal occlusion achieving complete recanalization (TICI 3) after 1 pass. No thromboembolic or hemorrhagic complication. PLAN: Transfer to ICU for continued post stroke care. Electronically Signed  By: Pedro Earls M.D.   On: 08/26/2022 11:55   IR US Guide Vasc Access Right  Result Date: 08/26/2022 INDICATION: 88 year old female with past medical history significant hypertension who presented to [facility] with shortness of breath and left-sided weakness, NIHSS 6. Her last known well was 10 p.m. on 08/25/2022. Head CT showed no acute infarct (ASPECTS 10). CT angiogram of the head and neck showed an occlusion of a proximal right M2/MCA posterior middle branch. She did not received thrombolytic as she was outside the window. She was transferred to [facility] for mechanical thrombectomy. A repeat head CT was obtained,  however, the study was significantly degraded by motion. Decision made to proceed with mechanical thrombectomy. EXAM: ULTRASOUND-GUIDED VASCULAR ACCESS DIAGNOSTIC CEREBRAL ANGIOGRAM MECHANICAL THROMBECTOMY FLAT PANEL HEAD CT COMPARISON:  CT/CT angiogram of the head and neck August 26, 2022. MEDICATIONS: No antibiotics administered. ANESTHESIA/SEDATION: The procedure was performed under general anesthesia. CONTRAST:  50 mL of Omnipaque 300 milligram/mL. FLUOROSCOPY: Radiation Exposure Index (as provided by the fluoroscopic device): 550 mGy Kerma COMPLICATIONS: None immediate. TECHNIQUE: Informed written consent was waved due to inability to locate next of kin or healthcare proxy. Two physician discussion held by myself and Dr. Leonel Ramsay. Decision to proceed with intervention in the best interest of the patient. Maximal Sterile Barrier Technique was utilized including caps, mask, sterile gowns, sterile gloves, sterile drape, hand hygiene and skin antiseptic. A timeout was performed prior to the initiation of the procedure. The right groin was prepped and draped in the usual sterile fashion. Using a micropuncture kit and the modified Seldinger technique, access was gained to the right common femoral artery and an 8 French sheath was placed. Real-time ultrasound guidance was utilized for vascular access including the acquisition of a permanent ultrasound image documenting patency of the accessed vessel. Under fluoroscopy, a Zoom 88 guide catheter was navigated over a 6 [place] Berenstein 2 catheter and a 0.035" Terumo Glidewire into the aortic arch. The catheter was placed into the right common carotid artery and then advanced into the right internal carotid artery. The diagnostic catheter was removed. Frontal and lateral angiograms of the head were obtained. FINDINGS: 1. Normal caliber of the right common femoral artery, adequate for vascular access. 2. There is an occlusion of a proximal right M2/MCA middle  division branch. PROCEDURE: Using biplane roadmap guidance, a Zoom 55 aspiration catheter was navigated over Colossus 35 microguidewire into the cavernous segment of the right ICA. The aspiration catheter was then advanced to the level of occlusion and connected to an aspiration pump. Continuous aspiration was performed for 1.5 minute. The guide catheter was connected to a VacLok syringe. The aspiration catheter was subsequently removed under constant aspiration. The guide catheter was aspirated for debris. Right internal carotid artery angiograms with frontal lateral views of the head showed complete recanalization of the right M2/MCA branch (TICI 3). Flat panel CT of the head was obtained and post processed in a separate workstation with concurrent attending physician supervision. Selected images were sent to PACS. Minimal contrast staining in the right sylvian fissure is noted. The catheter was subsequently withdrawn. Right common femoral artery angiogram was obtained in right anterior oblique view. The puncture is at the level of the common femoral artery. The artery has mild atherosclerotic changes without significant stenosis, adequate for closure device. The sheath was exchanged over the wire for a Perclose prostyle which was utilized for access closure. Immediate hemostasis was achieved. IMPRESSION: Successful mechanical thrombectomy with direct contact  aspiration for treatment of a right M2/MCA proximal occlusion achieving complete recanalization (TICI 3) after 1 pass. No thromboembolic or hemorrhagic complication. PLAN: Transfer to ICU for continued post stroke care. Electronically Signed   By: Pedro Earls M.D.   On: 08/26/2022 11:55   IR CT Head Ltd  Result Date: 08/26/2022 INDICATION: 88 year old female with past medical history significant hypertension who presented to [facility] with shortness of breath and left-sided weakness, NIHSS 6. Her last known well was 10 p.m. on  08/25/2022. Head CT showed no acute infarct (ASPECTS 10). CT angiogram of the head and neck showed an occlusion of a proximal right M2/MCA posterior middle branch. She did not received thrombolytic as she was outside the window. She was transferred to [facility] for mechanical thrombectomy. A repeat head CT was obtained, however, the study was significantly degraded by motion. Decision made to proceed with mechanical thrombectomy. EXAM: ULTRASOUND-GUIDED VASCULAR ACCESS DIAGNOSTIC CEREBRAL ANGIOGRAM MECHANICAL THROMBECTOMY FLAT PANEL HEAD CT COMPARISON:  CT/CT angiogram of the head and neck August 26, 2022. MEDICATIONS: No antibiotics administered. ANESTHESIA/SEDATION: The procedure was performed under general anesthesia. CONTRAST:  50 mL of Omnipaque 300 milligram/mL. FLUOROSCOPY: Radiation Exposure Index (as provided by the fluoroscopic device): 885 mGy Kerma COMPLICATIONS: None immediate. TECHNIQUE: Informed written consent was waved due to inability to locate next of kin or healthcare proxy. Two physician discussion held by myself and Dr. Leonel Ramsay. Decision to proceed with intervention in the best interest of the patient. Maximal Sterile Barrier Technique was utilized including caps, mask, sterile gowns, sterile gloves, sterile drape, hand hygiene and skin antiseptic. A timeout was performed prior to the initiation of the procedure. The right groin was prepped and draped in the usual sterile fashion. Using a micropuncture kit and the modified Seldinger technique, access was gained to the right common femoral artery and an 8 French sheath was placed. Real-time ultrasound guidance was utilized for vascular access including the acquisition of a permanent ultrasound image documenting patency of the accessed vessel. Under fluoroscopy, a Zoom 88 guide catheter was navigated over a 6 [place] Berenstein 2 catheter and a 0.035" Terumo Glidewire into the aortic arch. The catheter was placed into the right common  carotid artery and then advanced into the right internal carotid artery. The diagnostic catheter was removed. Frontal and lateral angiograms of the head were obtained. FINDINGS: 1. Normal caliber of the right common femoral artery, adequate for vascular access. 2. There is an occlusion of a proximal right M2/MCA middle division branch. PROCEDURE: Using biplane roadmap guidance, a Zoom 55 aspiration catheter was navigated over Colossus 35 microguidewire into the cavernous segment of the right ICA. The aspiration catheter was then advanced to the level of occlusion and connected to an aspiration pump. Continuous aspiration was performed for 1.5 minute. The guide catheter was connected to a VacLok syringe. The aspiration catheter was subsequently removed under constant aspiration. The guide catheter was aspirated for debris. Right internal carotid artery angiograms with frontal lateral views of the head showed complete recanalization of the right M2/MCA branch (TICI 3). Flat panel CT of the head was obtained and post processed in a separate workstation with concurrent attending physician supervision. Selected images were sent to PACS. Minimal contrast staining in the right sylvian fissure is noted. The catheter was subsequently withdrawn. Right common femoral artery angiogram was obtained in right anterior oblique view. The puncture is at the level of the common femoral artery. The artery has mild atherosclerotic changes without significant  stenosis, adequate for closure device. The sheath was exchanged over the wire for a Perclose prostyle which was utilized for access closure. Immediate hemostasis was achieved. IMPRESSION: Successful mechanical thrombectomy with direct contact aspiration for treatment of a right M2/MCA proximal occlusion achieving complete recanalization (TICI 3) after 1 pass. No thromboembolic or hemorrhagic complication. PLAN: Transfer to ICU for continued post stroke care. Electronically Signed    By: Pedro Earls M.D.   On: 08/26/2022 11:55   CT HEAD CODE STROKE WO CONTRAST  Result Date: 08/26/2022 CLINICAL DATA:  Code stroke.  Left arm weakness EXAM: CT HEAD WITHOUT CONTRAST TECHNIQUE: Contiguous axial images were obtained from the base of the skull through the vertex without intravenous contrast. RADIATION DOSE REDUCTION: This exam was performed according to the departmental dose-optimization program which includes automated exposure control, adjustment of the mA and/or kV according to patient size and/or use of iterative reconstruction technique. COMPARISON:  Same day CT head. FINDINGS: Image quality is significantly degraded by motion and streak artifact. Brain: There is hypodensity with loss of gray-white differentiation in the right insula and temporal lobe suspicious for early MCA infarct given finding on same-day CTA. There is no evidence of hemorrhagic transformation. There is no other evidence of evolved infarct. The basal ganglia appear preserved. There is no acute intracranial hemorrhage or extra-axial fluid collection. Background parenchymal volume is normal. The ventricles are stable in size. There is no mass lesion. There is no mass effect or midline shift. Vascular: There is hyperdensity in the right M2 vessel corresponding to the occlusion seen on the same-day CTA. Skull: Normal. Negative for fracture or focal lesion. Sinuses/Orbits: The paranasal sinuses are clear. Bilateral lens implants are in place. The globes and orbits are otherwise unremarkable. Other: None. ASPECTS [facility]roke Program Early CT Score) - Ganglionic level infarction (caudate, lentiform nuclei, internal capsule, insula, M1-M3 cortex): 5 - Supraganglionic infarction (M4-M6 cortex): 3 Total score (0-10 with 10 being normal): 8 IMPRESSION: 1. Hypodensity in the right insula and temporal lobe likely reflecting evolving infarct in the right MCA distribution given finding on earlier CTA, though  evaluation is degraded by motion and streak artifact. Aspects is 8. 2. No acute intracranial hemorrhage. Electronically Signed   By: Valetta Mole M.D.   On: 08/26/2022 09:29   DG Chest Portable 1 View  Result Date: 08/26/2022 CLINICAL DATA:  Shortness of breath. EXAM: PORTABLE CHEST 1 VIEW COMPARISON:  August 27, 2017. FINDINGS: Stable cardiomegaly. Status post right total shoulder arthroplasty. Mild bilateral perihilar and basilar interstitial densities are noted concerning for edema or possible atelectasis. IMPRESSION: Mild bilateral perihilar and basilar interstitial densities are noted concerning for edema or possibly atelectasis. Aortic Atherosclerosis (ICD10-I70.0). Electronically Signed   By: Marijo Conception M.D.   On: 08/26/2022 08:27   CT ANGIO HEAD NECK W WO CM W PERF  Result Date: 08/26/2022 CLINICAL DATA:  Stroke follow up EXAM: CT ANGIOGRAPHY HEAD AND NECK CT PERFUSION BRAIN TECHNIQUE: Multidetector CT imaging of the head and neck was performed using the standard protocol during bolus administration of intravenous contrast. Multiplanar CT image reconstructions and MIPs were obtained to evaluate the vascular anatomy. Carotid stenosis measurements (when applicable) are obtained utilizing NASCET criteria, using the distal internal carotid diameter as the denominator. Multiphase CT imaging of the brain was performed following IV bolus contrast injection. Subsequent parametric perfusion maps were calculated using RAPID software. RADIATION DOSE REDUCTION: This exam was performed according to the departmental dose-optimization program which includes automated exposure  control, adjustment of the mA and/or kV according to patient size and/or use of iterative reconstruction technique. CONTRAST:  192m OMNIPAQUE IOHEXOL 350 MG/ML SOLN COMPARISON:  CT Chest 08/27/17 FINDINGS: CT HEAD FINDINGS See same day CT brain for intracranial findings. No contrast-enhancing lesion is visualized. Note that  assessment contrast enhancement is limited due to timing of the contrast bolus. Bilateral lens replacements. No significant paranasal sinus disease. Presence of IV contrast markedly limits the ability to assess for intracranial hemorrhage. Review of the MIP images confirms the above findings CTA NECK FINDINGS Aortic arch: Standard branching. Imaged portion shows no evidence of aneurysm or dissection. No significant stenosis of the major arch vessel origins. Right carotid system: There is atherosclerotic plaque at the carotid bifurcation that results in mild narrowing of the origin of the right ICA. Left carotid system: There is atherosclerotic plaque at the carotid bifurcation that results in mild narrowing at the origin of the left ICA. Vertebral arteries: There is mild to moderate narrowing of the V4 segment of the left vertebral artery. Right dominant system. Skeleton: Multilevel degenerative changes with degenerative pannus at C1-C2. Compared to prior exam from 2018 there is also a severe compression deformity at T5 with near-complete collapse of the vertebral body. Other neck: Negative. Upper chest: Small bilateral pleural effusions. The esophagus is patulous. Ground-glass appearance bilateral lung apices may be related to expiratory phase of imaging. Review of the MIP images confirms the above findings CTA HEAD FINDINGS Anterior circulation: Acute occlusion of a proximal right M2 segment (series 7, image 105). Bilateral A1 and A2 segments are normal. The left M1 and M2 segments are normal. Posterior circulation: Fetal type PCA on the right with the right P2 segment predominantly supplied by the right PCOM. Venous sinuses: As permitted by contrast timing, patent. Anatomic variants: Fetal PCA on the right. Review of the MIP images confirms the above findings CT Brain Perfusion Findings: ASPECTS: 10 CBF (<30%) Volume: 045mPerfusion (Tmax>6.0s) volume: 4852mismatch Volume: 66m87mfarction Location:No infarct.  IMPRESSION: 1. Acute occlusion of a proximal right M2 segment. 2. CT perfusion demonstrates no core infarct, but there is a mismatch volume of 48 mL in the right MCA territory. 3. Severe compression deformity at T5 with near-complete collapse of the vertebral body. This is age indeterminate, but new compared to 2018. 4. Small bilateral pleural effusions and mild pulmonary edema. Findings were discussed with Dr. DixoDoren Custard11/20/23 at 8:04 AM. Electronically Signed   By: HemaMarin Roberts.   On: 08/26/2022 08:20   CT HEAD WO CONTRAST  Result Date: 08/26/2022 CLINICAL DATA:  Left arm weakness EXAM: CT HEAD WITHOUT CONTRAST TECHNIQUE: Contiguous axial images were obtained from the base of the skull through the vertex without intravenous contrast. RADIATION DOSE REDUCTION: This exam was performed according to the departmental dose-optimization program which includes automated exposure control, adjustment of the mA and/or kV according to patient size and/or use of iterative reconstruction technique. COMPARISON:  None Available. FINDINGS: Brain: No evidence of acute infarction, hemorrhage, hydrocephalus, extra-axial collection or mass lesion/mass effect. Patchy low-density in the cerebral white matter attributed to chronic small vessel ischemia. Low-density areas in the right thalamus and right pons appear discrete on reformats and are attributed to chronic lacune. Age normal brain volume Vascular: No hyperdense vessel or unexpected calcification. Extensive atheromatous calcification Skull: Negative Sinuses/Orbits: Negative Other: Prelim sent in epic chat IMPRESSION: 1. No acute finding.  ASPECTS is 10. 2. Chronic small vessel ischemia. Electronically Signed   By: JonaRoderic Palau  Watts M.D.   On: 08/26/2022 06:27     Assessment and Plan:   Paroxysmal atrial fibrillation with RVR Secondary hypercoagulable state S/P right MCA stroke  Patient noted to be in afib with RVR peri/post thrombectomy for right MCA thrombus.  Appears that she converted to NSR following Amio bolus and a short period of Amio infusion. Following this, she was initiated on Eliquis 49m BID and oral metoprolol. Patient fortunately appears to have had full neurological recovery.  CHA2DS2-VASc Score = 6 TTE this admission notable for "severely dilated" left atrium Given acute CVA and enlarged atrium, very likely that patient has been in paroxysmal afib for some time with cardioembolic source.  Afib with RVR on telemetry today and at the time of my exam. Agree with Eliquis 519mBID Will re-bolus amiodarone and proceed with infusion. Plan transition to oral Amiodarone to further rhythm control and reduce stroke risk at time of discharge.   Hypertension  Patient with hx of hypertension though not requiring chronic medication. Will stop Metoprolol that had been ordered primarily for rate control.   Risk Assessment/Risk Scores:          CHA2DS2-VASc Score = 6   This indicates a 9.7% annual risk of stroke. The patient's score is based upon: CHF History: 0 HTN History: 1 Diabetes History: 0 Stroke History: 2 Vascular Disease History: 0 Age Score: 2 Gender Score: 1         For questions or updates, please contact [employer] consult [email or web address] for contact info under    Signed, EvLily KocherPA-C  08/28/2022 1:39 PM  Personally seen and examined. Agree with above.  [age]ear old with embolic stroke, newly discovered atrial fibrillation.  Paroxysmal atrial fibrillation, has been in and out.  Unfortunately since 8 AM this morning she has been with A-fib RVR.  Metoprolol 25 mg twice a day has not been helping nor has IV 5 mg boluses.  She is pleasant sitting in chair, smiling, family members in room.  She prided herself on not taking any prescription drugs up until now.  Heart irregularly irregular tachycardic.  Echo with dilated left atrium, normal EF.  Assessment and plan:  Paroxysmal atrial  fibrillation-CHA2DS2-VASc 6. - We will go ahead and start her back on IV amiodarone.  Tomorrow may be able to transition her to p.o. - Starting Eliquis 5 mg twice a day for anticoagulation.  Her weight is over 60 kg and her creatinine is less than 1.5.   Stroke, middle cerebral artery thrombectomy successful - Neurology notes reviewed.  Okay with Eliquis.  MaCandee FurbishMD

## 2022-08-28 NOTE — Progress Notes (Addendum)
STROKE TEAM PROGRESS NOTE   INTERVAL HISTORY Patient is seen in her room with son and sister at the bedside. Cardiology consulted due to afib rvr and they recommend starting an amiodarone drip.  We will consider transitioning to p.o. tomorrow. Medications were sent to [facility] pharmacy.  They are now in main pharmacy pending discharge  Vitals:   08/28/22 1400 08/28/22 1405 08/28/22 1414 08/28/22 1443  BP: (!) 98/56 109/83 100/67 110/75  Pulse: (!) 129 (!) 120 79 (!) 116  Resp: 20  20   Temp: 98.4 F (36.9 C)     TempSrc:      SpO2: 100% 100% 98% 99%  Weight:      Height:       CBC:  Recent Labs  Lab 08/26/22 0609 08/26/22 0614 08/26/22 1813 08/27/22 0343  WBC 6.9  --   --  7.3  NEUTROABS 5.2  --   --  5.6  HGB 12.4   < > 11.2* 11.8*  HCT 37.1   < > 33.0* 35.1*  MCV 89.8  --   --  90.5  PLT 194  --   --  161   < > = values in this interval not displayed.    Basic Metabolic Panel:  Recent Labs  Lab 08/26/22 2043 08/27/22 0343  NA 142 140  K 3.8 3.7  CL 115* 108  CO2 19* 20*  GLUCOSE 121* 118*  BUN 12 13  CREATININE 0.94 1.14*  CALCIUM 7.2* 8.5*  MG 1.7 2.4    Lipid Panel:  Recent Labs  Lab 08/27/22 0343  CHOL 139  TRIG 67  HDL 59  CHOLHDL 2.4  VLDL 13  LDLCALC 67    HgbA1c:  Recent Labs  Lab 08/27/22 0343  HGBA1C 5.6    Urine Drug Screen: No results for input(s): "LABOPIA", "COCAINSCRNUR", "LABBENZ", "AMPHETMU", "THCU", "LABBARB" in the last 168 hours.  Alcohol Level  Recent Labs  Lab 08/26/22 0609  ETH <10     IMAGING past 24 hours No results found.  PHYSICAL EXAM General: Alert, well-developed, well-nourished elderly patient in no acute distress Respiratory: Regular, unlabored respirations on room air  NEURO:  Mental Status: AA&Ox3  Speech/Language: speech is without dysarthria or aphasia.  Naming, fluency, and comprehension intact.  Cranial Nerves:  II: PERRL.  III, IV, VI: EOMI. Eyelids elevate symmetrically.  V: Sensation is  intact to light touch and symmetrical to face.  VII: Smile is symmetrical.   VIII: hearing intact to voice. IX, X: Phonation is normal.  XII: tongue is midline without fasciculations. Motor: 5/5 strength to all muscle groups tested.  Tone: is normal and bulk is normal Sensation- Intact to light touch bilaterally.   Coordination: FTN intact bilaterally Gait- deferred   ASSESSMENT/PLAN Sheila Jordan is a 88 y.o. female with history of hypertension and CKD stage III presenting with difficulty moving her left side, left facial droop and shortness of breath.  She was brought to the hospital and found to have a right M2 occlusion.  Mechanical thrombectomy was performed, and this procedure was successful.  Patient was noted to be in atrial fibrillation with RVR earlier and was started on amiodarone.  Need for anticoagulation was discussed, and patient was placed on Eliquis.  Stroke:  right MCA stroke status post mechanical thrombectomy Etiology: Cardioembolic in the setting of atrial fibrillation Code Stroke CT head hypodensity in right insula and temporal lobe ASPECTS 8   CTA head & neck acute occlusion of proximal right  M2 CT perfusion no core infarct and 48 mL penumbra in right MCA territory MRI multiple small acute infarcts in right insula and frontal lobe 2D Echo EF 50 to [id number], grade 2 diastolic dyspnea function, severely dilated left atrium, no atrial level shunt  LDL 67 HgbA1c 5.6 VTE prophylaxis -fully anticoagulated with Eliquis    Diet   Diet regular Room service appropriate? Yes; Fluid consistency: Thin   No antithrombotic prior to admission, now on aspirin 81 mg daily and Eliquis (apixaban) daily.  Therapy recommendations: Home health PT Disposition: Pending  Hypertension Home meds:  none Stable Keep systolic blood pressure under 160 Long-term BP goal normotensive  Hyperlipidemia Home meds: None LDL 67, goal < 70 High intensity statin not indicated as LDL below  goal Continue statin at discharge  Atrial fibrillation Patient was noted to be in A-fib with RVR We will begin anticoagulation with Eliquis Rate control: Metoprolol 25 mg twice daily -> now on amiodarone IV  Other Stroke Risk Factors Advanced Age >/= 75   Other Active Problems none  Hospital day # 2  Patient seen and examined by NP/APP with MD. MD to update note as needed.   Sheila Ores, DNP, FNP-BC [facility] Pager: [phone]  ATTENDING ATTESTATION:   88 year old with right M2 occlusion status post IR out of the window for TNK.  Etiology of her stroke is atrial fibrillation.  She continues to do well except for A-fib with rapid RVR.  Cardiology was consulted.  Started amiodarone drip.  Continue Eliquis.  Will continue to monitor.  Multiple family members are in the room and they were updated of her condition.  All questions answered.    Dr. Reeves Jordan evaluated pt independently, reviewed imaging, chart, labs. Discussed and formulated plan with the Resident/APP. Changes were made to the note where appropriate. Please see APP/resident note above for details.     This patient is critically ill due to afib, stroke s/p IR and at significant risk of neurological worsening, death form heart failure, respiratory failure, recurrent stroke, bleeding from [facility], seizure, sepsis. This patient's care requires constant monitoring of vital signs, hemodynamics, respiratory and cardiac monitoring, review of multiple databases, neurological assessment, discussion with family, other specialists and medical decision making of high complexity. I spent 35 minutes of neurocritical care time in the care of this patient.    Sheila Kaffenberger,MD    To contact Stroke Continuity provider, please refer to [email or web address] After hours, contact General Neurology

## 2022-08-28 NOTE — Progress Notes (Signed)
Physical Therapy Treatment Patient Details Name: Sheila Jordan MRN: [id number] DOB: 1928-04-18 Today's Date: 08/28/2022   History of Present Illness Pt is a 88 y.o. female who presented to [facility] 11/20 with SOB and L side weakness. CTA revealed R MCA occlusion. MRI revealed multiple small acute infarcts in the right insula and overlying right frontal lobe.  She was transferred to [facility] and underwent thrombectomy. She remained intubated following procedure due to a fib with RVR. Extubated 11/21. PMH: HTN, OA, h/o R tib/fib fx with IM nail (1995), h/o R prox humeral fx s/p sx repair and pelvic ring injury (2021)    PT Comments    Pt admitted with above diagnosis. Pt was able to ambulate with RW with min assist.  Pt with flexed posture and was unsteady needing constant min assist. Son reports that pt will have 24 hour care initially and progress back to being alone.   Pt has a RW she can use. Will continue to follow acutely.  Pt currently with functional limitations due to the deficits listed below. Pt will benefit from skilled PT to increase their independence and safety with mobility to allow discharge to the venue listed below.      Recommendations for follow up therapy are one component of a multi-disciplinary discharge planning process, led by the attending physician.  Recommendations may be updated based on patient status, additional functional criteria and insurance authorization.  Follow Up Recommendations  Home health PT     Assistance Recommended at Discharge Frequent or constant Supervision/Assistance  Patient can return home with the following A little help with walking and/or transfers;A little help with bathing/dressing/bathroom;Help with stairs or ramp for entrance;Assistance with cooking/housework;Assist for transportation   Equipment Recommendations  None recommended by PT    Recommendations for Other Services       Precautions / Restrictions Precautions Precautions: Fall      Mobility  Bed Mobility               General bed mobility comments: in chair on arrival    Transfers Overall transfer level: Needs assistance Equipment used: 1 person hand held assist Transfers: Sit to/from Stand Sit to Stand: Min assist           General transfer comment: Pt needed assist to rise and she maintained forward flexion of head, neck and upper trunk throughout which family states is different.    Ambulation/Gait Ambulation/Gait assistance: Min assist Gait Distance (Feet): 65 Feet Assistive device: 1 person hand held assist, Rolling walker (2 wheels) Gait Pattern/deviations: Decreased step length - right, Decreased step length - left, Leaning posteriorly, Trunk flexed, Narrow base of support   Gait velocity interpretation: <1.31 ft/sec, indicative of household ambulator   General Gait Details: Pt wanted to walk without device however reaching for bed and rails due to imbalance and flexed posture.  Pt did better with RW use however still needed guard assist due to poor coordination and posture. Pt aware that she needs to use RW and have assist initially upon d/c.   Stairs             Wheelchair Mobility    Modified Rankin (Stroke Patients Only) Modified Rankin (Stroke Patients Only) Pre-Morbid Rankin Score: No symptoms Modified Rankin: Moderately severe disability     Balance Overall balance assessment: Needs assistance Sitting-balance support: No upper extremity supported, Feet unsupported Sitting balance-Leahy Scale: Good     Standing balance support: Bilateral upper extremity supported, During functional activity Standing balance-Leahy Scale:  Fair Standing balance comment: reliant on external support                            Cognition Arousal/Alertness: Awake/alert Behavior During Therapy: WFL for tasks assessed/performed Overall Cognitive Status: Within Functional Limits for tasks assessed                                           Exercises      General Comments General comments (skin integrity, edema, etc.): O2 96% on 2L; VSS on RA; HR 120-140 bpm with nurse aware, did replace O2 at end of walk due to pt in afib and was SOB after walk.      Pertinent Vitals/Pain Pain Assessment Pain Assessment: No/denies pain    Home Living                          Prior Function            PT Goals (current goals can now be found in the care plan section) Acute Rehab PT Goals Patient Stated Goal: home Progress towards PT goals: Progressing toward goals    Frequency    Min 4X/week      PT Plan Current plan remains appropriate    Co-evaluation              AM-PAC PT "6 Clicks" Mobility   Outcome Measure  Help needed turning from your back to your side while in a flat bed without using bedrails?: A Little Help needed moving from lying on your back to sitting on the side of a flat bed without using bedrails?: A Little Help needed moving to and from a bed to a chair (including a wheelchair)?: A Little Help needed standing up from a chair using your arms (e.g., wheelchair or bedside chair)?: A Little Help needed to walk in hospital room?: A Lot Help needed climbing 3-5 steps with a railing? : A Lot 6 Click Score: 16    End of Session Equipment Utilized During Treatment: Gait belt;Oxygen Activity Tolerance: Patient limited by fatigue Patient left: with call bell/phone within reach;in chair;with family/visitor present Nurse Communication: Mobility status PT Visit Diagnosis: Difficulty in walking, not elsewhere classified (R26.2)     Time: 1441-1500 PT Time Calculation (min) (ACUTE ONLY): 19 min  Charges:  $Gait Training: 8-22 mins                     Brityn Mastrogiovanni M,PT Acute Rehab Services [place] 08/28/2022, 3:31 PM

## 2022-08-28 NOTE — Evaluation (Signed)
Occupational Therapy Evaluation Patient Details Name: Sheila Jordan MRN: [id number] DOB: 1928/03/07 Today's Date: 08/28/2022   History of Present Illness Pt is a 88 y.o. female who presented to [facility] 11/20 with SOB and L side weakness. CTA revealed R MCA occlusion. MRI revealed multiple small acute infarcts in the right insula and overlying right frontal lobe.  She was transferred to [facility] and underwent thrombectomy. She remained intubated following procedure due to a fib with RVR. Extubated 11/21. PMH: HTN, OA, h/o R tib/fib fx with IM nail (1995), h/o R prox humeral fx s/p sx repair and pelvic ring injury (2021)   Clinical Impression   PTA pt lives alone independently. Pt currently min guard A with ADL and mobility. Noted stridor type breathing with RR in the 40s and HR into the 150s with activity on RA. Nsg notified.  Recommend follow up with HHOT. Family can provide initial 24/7 S if needed. Acute OT to follow.      Recommendations for follow up therapy are one component of a multi-disciplinary discharge planning process, led by the attending physician.  Recommendations may be updated based on patient status, additional functional criteria and insurance authorization.   Follow Up Recommendations  Home health OT     Assistance Recommended at Discharge Frequent or constant Supervision/Assistance (initially)  Patient can return home with the following A little help with bathing/dressing/bathroom;Direct supervision/assist for medications management;Direct supervision/assist for financial management;Assist for transportation;Help with stairs or ramp for entrance;Assistance with cooking/housework    Functional Status Assessment  Patient has had a recent decline in their functional status and demonstrates the ability to make significant improvements in function in a reasonable and predictable amount of time.  Equipment Recommendations  None recommended by OT    Recommendations for Other  Services       Precautions / Restrictions Precautions Precautions: Fall      Mobility Bed Mobility Overal bed mobility: Modified Independent                  Transfers Overall transfer level: Needs assistance Equipment used: 1 person hand held assist Transfers: Sit to/from Stand Sit to Stand: Min guard                  Balance Overall balance assessment: Needs assistance   Sitting balance-Leahy Scale: Good       Standing balance-Leahy Scale: Fair                             ADL either performed or assessed with clinical judgement   ADL Overall ADL's : Needs assistance/impaired     Grooming: Set up   Upper Body Bathing: Set up;Sitting   Lower Body Bathing: Min guard;Sit to/from stand   Upper Body Dressing : Set up;Sitting   Lower Body Dressing: Min guard;Sit to/from stand   Toilet Transfer: Minimal assistance;Ambulation   Toileting- Clothing Manipulation and Hygiene: Min guard       Functional mobility during ADLs: Minimal assistance (mild unsteadiness)       Vision Baseline Vision/History: 1 Wears glasses Additional Comments: Did not have glasses during assessment; appears at baseline and pt does not complain of any changes     Perception Perception Comments: appears intact; will continue to assess   Praxis Praxis Praxis tested?: Within functional limits    Pertinent Vitals/Pain Pain Assessment Pain Assessment: No/denies pain     Hand Dominance Right   Extremity/Trunk Assessment Upper Extremity Assessment  Upper Extremity Assessment: Overall WFL for tasks assessed   Lower Extremity Assessment Lower Extremity Assessment: Defer to PT evaluation   Cervical / Trunk Assessment Cervical / Trunk Assessment: Kyphotic   Communication Communication Communication: No difficulties   Cognition Arousal/Alertness: Awake/alert Behavior During Therapy: WFL for tasks assessed/performed Overall Cognitive Status: Within  Functional Limits for tasks assessed                                 General Comments: Will further assess cognition; had difficulty problem solving how to use the incentive spirmatoer despite multiple directions     General Comments       Exercises Exercises: Other exercises Other Exercises Other Exercises: attempted use of incentive spirometer - pt unabl eto problem solve on how to use - family present for education   Shoulder Instructions      Home Living Family/patient expects to be discharged to:: Private residence Living Arrangements: Alone Available Help at Discharge: Family Type of Home: House Home Access: Ramped entrance     Home Layout: Two level;Able to live on main level with bedroom/bathroom     Bathroom Shower/Tub: Tub/shower unit;Curtain   Bathroom Toilet: Handicapped height Bathroom Accessibility: Yes How Accessible: Accessible via walker Home Equipment: Rolling Walker (2 wheels);Cane - single point;Wheelchair - manual;Grab bars - toilet;Grab bars - tub/shower   Additional Comments: 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  Lives With: Alone    Prior Functioning/Environment Prior Level of Function : Independent/Modified Independent;Driving                        OT Problem List: Decreased safety awareness;Cardiopulmonary status limiting activity;Decreased activity tolerance      OT Treatment/Interventions: Self-care/ADL training;Therapeutic exercise;DME and/or AE instruction;Therapeutic activities;Cognitive remediation/compensation;Patient/family education;Balance training    OT Goals(Current goals can be found in the care plan section) Acute Rehab OT Goals Patient Stated Goal: to regain independence OT Goal Formulation: With patient/family Time For Goal Achievement: 09/10/22 Potential to Achieve Goals: Good  OT Frequency: Min  2X/week    Co-evaluation              AM-PAC OT "6 Clicks" Daily Activity     Outcome Measure Help from another person eating meals?: None Help from another person taking care of personal grooming?: A Little Help from another person toileting, which includes using toliet, bedpan, or urinal?: A Little Help from another person bathing (including washing, rinsing, drying)?: A Little Help from another person to put on and taking off regular upper body clothing?: A Little Help from another person to put on and taking off regular lower body clothing?: A Little 6 Click Score: 19   End of Session Equipment Utilized During Treatment: Gait belt Nurse Communication: Other (comment) (breathing pattern)  Activity Tolerance: Patient tolerated treatment well Patient left: in bed;with call bell/phone within reach;with family/visitor present  OT Visit Diagnosis: Unsteadiness on feet (R26.81)                Time: 1000-1029 OT Time Calculation (min): 29 min Charges:  OT General Charges $OT Visit: 1 Visit OT Evaluation $OT Eval Moderate Complexity: 1 Mod OT Treatments $Self Care/Home Management : 8-22 mins  Luisa Dago, OT/L   Acute OT Clinical Specialist Acute Rehabilitation Services Pager [phone] Office [phone]   [facility] 08/28/2022, 10:45  AM

## 2022-08-28 NOTE — Progress Notes (Signed)
   08/28/22 1207  Assess: MEWS Score  Temp 98.3 F (36.8 C)  BP 129/86  MAP (mmHg) 97  Pulse Rate (!) 130  Resp 20  Level of Consciousness Alert  SpO2 96 %  O2 Device Nasal Cannula  O2 Flow Rate (L/min) 2 L/min  Assess: MEWS Score  MEWS Temp 0  MEWS Systolic 0  MEWS Pulse 3  MEWS RR 0  MEWS LOC 0  MEWS Score 3  MEWS Score Color Yellow  Assess: if the MEWS score is Yellow or Red  Were vital signs taken at a resting state? Yes  Focused Assessment No change from prior assessment  Does the patient meet 2 or more of the SIRS criteria? Yes  Does the patient have a confirmed or suspected source of infection? No  MEWS guidelines implemented *See Row Information* Yes  Treat  MEWS Interventions Administered scheduled meds/treatments  Pain Scale 0-10  Pain Score 0  Take Vital Signs  Increase Vital Sign Frequency  Yellow: Q 2hr X 2 then Q 4hr X 2, if remains yellow, continue Q 4hrs  Escalate  MEWS: Escalate Yellow: discuss with charge nurse/RN and consider discussing with provider and RRT  Notify: Charge Nurse/RN  Name of Charge Nurse/RN Notified Shanda Bumps RN  Date Charge Nurse/RN Notified 08/28/22  Time Charge Nurse/RN Notified 1210  Provider Notification  Provider Name/Title NP Shafer  Date Provider Notified 08/28/22  Time Provider Notified 1224  Method of Notification Page  Notification Reason Other (Comment) (afib maintained since arrival to unit)  Provider response Other (Comment) (advised to give previously ordered lopressor.)  Date of Provider Response 08/28/22  Time of Provider Response 1225  Document  Patient Outcome Other (Comment) (remains stable)  Progress note created (see row info) Yes  Assess: SIRS CRITERIA  SIRS Temperature  0  SIRS Pulse 1  SIRS Respirations  0  SIRS WBC 1  SIRS Score Sum  2

## 2022-08-28 NOTE — TOC Transition Note (Signed)
TOC meds locked up in MAIN pharmacy at end of day 11/22 for possible 11/23 discharge

## 2022-08-28 NOTE — Progress Notes (Signed)
Patient transferred to unit at 1207hrs.  HR noted to be in the 120-130s, BP 129/86.  Patient denies SOB/CP.  NP Shafer notified, will give lopressor 5mg  IV per ordered dose.  Cardiology to consult.

## 2022-08-28 NOTE — Progress Notes (Signed)
Mobility Specialist - Progress Note   08/28/22 1524  Mobility  Activity Transferred from chair to bed  Level of Assistance Contact guard assist, steadying assist  Assistive Device None  Activity Response Tolerated well  Mobility Referral Yes  $Mobility charge 1 Mobility   Pt received in chair and requesting assistance back to bed. No complaints throughout. Pt returned to bed with all needs met.   Franki Monte  Mobility Specialist Please contact via Solicitor or Rehab office at [phone]

## 2022-08-28 NOTE — Anesthesia Postprocedure Evaluation (Signed)
Anesthesia Post Note  Patient: Sheila Jordan  Procedure(Jordan) Performed: RADIOLOGY WITH ANESTHESIA     Patient location during evaluation: PACU Anesthesia Type: General Level of consciousness: sedated Pain management: pain level controlled Vital Signs Assessment: post-procedure vital signs reviewed and stable Respiratory status: patient remains intubated per anesthesia plan Cardiovascular status: stable Postop Assessment: no apparent nausea or vomiting Anesthetic complications: no   No notable events documented.  Last Vitals:  Vitals:   08/28/22 0731 08/28/22 0800  BP: 138/73 120/80  Pulse: (!) 59 (!) 122  Resp: 16 (!) 30  Temp: (!) 36.4 C   SpO2: 97% 92%    Last Pain:  Vitals:   08/28/22 0800  TempSrc:   PainSc: 0-No pain                 Sheila Jordan

## 2022-08-29 MED ORDER — AMIODARONE HCL 400 MG PO TABS: 400.0000 mg | ORAL_TABLET | Freq: Every day | ORAL | 1 refills | Status: DC

## 2022-08-29 MED ORDER — AMIODARONE HCL 200 MG PO TABS
400.0000 mg | ORAL_TABLET | Freq: Every day | ORAL | Status: DC
  Administered 2022-08-29: 400 mg via ORAL
  Filled 2022-08-29: qty 2

## 2022-08-29 MED ORDER — AMIODARONE HCL IN DEXTROSE 360-4.14 MG/200ML-% IV SOLN: 30.0000 mg/h | INTRAVENOUS | Status: DC

## 2022-08-29 NOTE — Progress Notes (Signed)
Pt safely discharged. Discharge packet provided with teach-back method to Pt, son and daughter. VS as per flow. IVs removed, Pt verbalized understanding. All questions and concerns addressed.

## 2022-08-29 NOTE — Progress Notes (Addendum)
Rounding Note    Patient Name: Sheila Jordan Date of Encounter: 08/29/2022  [facility] Cardiologist: None Dr. Anne Fu  Subjective   Feels wel.  Has a dry cough and feels tired after a coughing spell.  Feels it may be related to recent general anesthesia  Inpatient Medications    Scheduled Meds:   stroke: early stages of recovery book   Does not apply Once   apixaban  5 mg Oral BID   aspirin EC  81 mg Oral Daily   Continuous Infusions:  sodium chloride Stopped (08/28/22 1112)   amiodarone 30 mg/hr (08/28/22 2330)   PRN Meds: acetaminophen **OR** acetaminophen (TYLENOL) oral liquid 160 mg/5 mL **OR** acetaminophen, iohexol, ipratropium-albuterol, ondansetron (ZOFRAN) IV, mouth rinse   Vital Signs    Vitals:   08/28/22 1613 08/28/22 1643 08/28/22 2005 08/29/22 0500  BP: 110/65 93/62 (!) 131/95 (!) 147/62  Pulse: 65 (!) 122 96 (!) 58  Resp:  18 18 18   Temp:   (!) 97.5 F (36.4 C) (!) 97.4 F (36.3 C)  TempSrc:   Oral Oral  SpO2: 97% 97% 98% 99%  Weight:      Height:        Intake/Output Summary (Last 24 hours) at 08/29/2022 0858 Last data filed at 08/28/2022 2119 Gross per 24 hour  Intake 1116.01 ml  Output --  Net 1116.01 ml      08/26/2022    6:13 AM 06/21/2020    5:00 AM 06/20/2020    5:48 AM  Last 3 Weights  Weight (lbs) 167 lb 8.8 oz 168 lb 6.9 oz 178 lb 12.7 oz  Weight (kg) 76 kg 76.4 kg 81.1 kg      Telemetry    NSR - Personally Reviewed  ECG    None recent  Physical Exam   GEN: No acute distress.  frail Neck: No JVD Cardiac: RRR, no murmurs, rubs, or gallops.  Respiratory: Clear to auscultation bilaterally. GI: Soft, nontender, non-distended  MS: No edema; kyphosis; chronic right leg edema Neuro:  Nonfocal  Psych: Normal affect   Labs    High Sensitivity Troponin:  No results for input(s): "TROPONINIHS" in the last 720 hours.   Chemistry Recent Labs  Lab 08/26/22 0609 08/26/22 0614 08/26/22 1813 08/26/22 2043  08/27/22 0343  NA 140 142 140 142 140  K 3.7 3.7 3.7 3.8 3.7  CL 112* 109  --  115* 108  CO2 21*  --   --  19* 20*  GLUCOSE 122* 120*  --  121* 118*  BUN 17 17  --  12 13  CREATININE 1.01* 1.00  --  0.94 1.14*  CALCIUM 8.6*  --   --  7.2* 8.5*  MG  --   --   --  1.7 2.4  PROT 7.0  --   --  5.1* 6.0*  ALBUMIN 4.2  --   --  3.1* 3.6  AST 38  --   --  38 37  ALT 38  --   --  43 47*  ALKPHOS 104  --   --  98 113  BILITOT 1.1  --   --  0.5 0.5  GFRNONAA 52*  --   --  57* 45*  ANIONGAP 7  --   --  8 12    Lipids  Recent Labs  Lab 08/27/22 0343  CHOL 139  TRIG 67  HDL 59  LDLCALC 67  CHOLHDL 2.4    Hematology Recent Labs  Lab 08/26/22 0609 08/26/22 0614 08/26/22 1813 08/27/22 0343  WBC 6.9  --   --  7.3  RBC 4.13  --   --  3.88  HGB 12.4 13.3 11.2* 11.8*  HCT 37.1 39.0 33.0* 35.1*  MCV 89.8  --   --  90.5  MCH 30.0  --   --  30.4  MCHC 33.4  --   --  33.6  RDW 14.0  --   --  14.4  PLT 194  --   --  161   Thyroid No results for input(s): "TSH", "FREET4" in the last 168 hours.  BNPNo results for input(s): "BNP", "PROBNP" in the last 168 hours.  DDimer No results for input(s): "DDIMER" in the last 168 hours.   Radiology    No results found.  Cardiac Studies   LVEF 50-55%  Patient Profile     88 y.o. female with stroke, status post middle cerebral artery thrombectomy  Assessment & Plan    Atrial fibrillation: Was transition back to IV amiodarone yesterday for rate control in hopes of chemical cardioversion.  Now in NSR.  CHange AMio to PO 400 mg daily after current IV bag finishes.   Acquired thrombophilia: Eliquis 5 mg twice daily for anticoagulation.  Given her weight and normal renal function, she does qualify for the higher dose of Eliquis.  Watch for bleeding.  No bleeding issues currently.   CVA: appears to have had significant recovery thus far.   Discussed with family at bedside     For questions or updates, please contact [place] Please consult [email or web address] for contact info under        Signed, Larae Grooms, MD  08/29/2022, 8:58 AM

## 2022-08-29 NOTE — Progress Notes (Signed)
IV Amio rate decreased per order. Was in Afib with HR up to 120s at start of shift. Converted to NSR around 21:00.

## 2022-08-30 ENCOUNTER — Emergency Department (HOSPITAL_COMMUNITY): Payer: Medicare Other

## 2022-08-30 ENCOUNTER — Inpatient Hospital Stay (HOSPITAL_COMMUNITY)
Admission: EM | Admit: 2022-08-30 | Discharge: 2022-09-02 | DRG: 291 | Disposition: A | Discharge status: Home Health | Payer: Medicare Other | Attending: Internal Medicine | Admitting: Internal Medicine

## 2022-08-30 ENCOUNTER — Telehealth: Payer: Self-pay | Admitting: Physician Assistant

## 2022-08-30 ENCOUNTER — Other Ambulatory Visit: Payer: Self-pay

## 2022-08-30 ENCOUNTER — Encounter (HOSPITAL_COMMUNITY): Payer: Self-pay

## 2022-08-30 DIAGNOSIS — M1990 Unspecified osteoarthritis, unspecified site: Secondary | ICD-10-CM | POA: Diagnosis not present

## 2022-08-30 DIAGNOSIS — Z820 Family history of epilepsy and other diseases of the nervous system: Secondary | ICD-10-CM | POA: Diagnosis not present

## 2022-08-30 DIAGNOSIS — I5033 Acute on chronic diastolic (congestive) heart failure: Secondary | ICD-10-CM | POA: Diagnosis present

## 2022-08-30 DIAGNOSIS — I10 Essential (primary) hypertension: Secondary | ICD-10-CM | POA: Diagnosis not present

## 2022-08-30 DIAGNOSIS — I69354 Hemiplegia and hemiparesis following cerebral infarction affecting left non-dominant side: Secondary | ICD-10-CM | POA: Diagnosis not present

## 2022-08-30 DIAGNOSIS — K219 Gastro-esophageal reflux disease without esophagitis: Secondary | ICD-10-CM | POA: Diagnosis not present

## 2022-08-30 DIAGNOSIS — Z7901 Long term (current) use of anticoagulants: Secondary | ICD-10-CM

## 2022-08-30 DIAGNOSIS — Z66 Do not resuscitate: Secondary | ICD-10-CM | POA: Diagnosis not present

## 2022-08-30 DIAGNOSIS — T502X5A Adverse effect of carbonic-anhydrase inhibitors, benzothiadiazides and other diuretics, initial encounter: Secondary | ICD-10-CM | POA: Diagnosis not present

## 2022-08-30 DIAGNOSIS — I5031 Acute diastolic (congestive) heart failure: Secondary | ICD-10-CM | POA: Diagnosis present

## 2022-08-30 DIAGNOSIS — I480 Paroxysmal atrial fibrillation: Secondary | ICD-10-CM | POA: Diagnosis not present

## 2022-08-30 DIAGNOSIS — Z803 Family history of malignant neoplasm of breast: Secondary | ICD-10-CM | POA: Diagnosis not present

## 2022-08-30 DIAGNOSIS — Z1152 Encounter for screening for COVID-19: Secondary | ICD-10-CM

## 2022-08-30 DIAGNOSIS — R079 Chest pain, unspecified: Secondary | ICD-10-CM | POA: Diagnosis not present

## 2022-08-30 DIAGNOSIS — J811 Chronic pulmonary edema: Secondary | ICD-10-CM | POA: Diagnosis not present

## 2022-08-30 DIAGNOSIS — Z8673 Personal history of transient ischemic attack (TIA), and cerebral infarction without residual deficits: Secondary | ICD-10-CM

## 2022-08-30 DIAGNOSIS — J9811 Atelectasis: Secondary | ICD-10-CM | POA: Diagnosis not present

## 2022-08-30 DIAGNOSIS — E876 Hypokalemia: Secondary | ICD-10-CM | POA: Diagnosis present

## 2022-08-30 DIAGNOSIS — D649 Anemia, unspecified: Secondary | ICD-10-CM | POA: Diagnosis present

## 2022-08-30 DIAGNOSIS — I5032 Chronic diastolic (congestive) heart failure: Secondary | ICD-10-CM

## 2022-08-30 DIAGNOSIS — I110 Hypertensive heart disease with heart failure: Secondary | ICD-10-CM | POA: Diagnosis not present

## 2022-08-30 DIAGNOSIS — J90 Pleural effusion, not elsewhere classified: Secondary | ICD-10-CM | POA: Diagnosis not present

## 2022-08-30 DIAGNOSIS — Z96611 Presence of right artificial shoulder joint: Secondary | ICD-10-CM | POA: Diagnosis not present

## 2022-08-30 DIAGNOSIS — I5043 Acute on chronic combined systolic (congestive) and diastolic (congestive) heart failure: Principal | ICD-10-CM

## 2022-08-30 DIAGNOSIS — R0602 Shortness of breath: Secondary | ICD-10-CM | POA: Diagnosis not present

## 2022-08-30 DIAGNOSIS — R109 Unspecified abdominal pain: Secondary | ICD-10-CM | POA: Diagnosis not present

## 2022-08-30 HISTORY — DX: Personal history of transient ischemic attack (TIA), and cerebral infarction without residual deficits: Z86.73

## 2022-08-30 HISTORY — DX: Paroxysmal atrial fibrillation: I48.0

## 2022-08-30 HISTORY — DX: Chronic diastolic (congestive) heart failure: I50.32

## 2022-08-30 LAB — RESP PANEL BY RT-PCR (FLU A&B, COVID) ARPGX2
Influenza A by PCR: NEGATIVE
Influenza B by PCR: NEGATIVE
SARS Coronavirus 2 by RT PCR: NEGATIVE

## 2022-08-30 LAB — CBC WITH DIFFERENTIAL/PLATELET
Abs Immature Granulocytes: 0.03 10*3/uL (ref 0.00–0.07)
Basophils Absolute: 0 10*3/uL (ref 0.0–0.1)
Basophils Relative: 0 %
Eosinophils Absolute: 0 10*3/uL (ref 0.0–0.5)
Eosinophils Relative: 0 %
HCT: 34.8 % — ABNORMAL LOW (ref 36.0–46.0)
Hemoglobin: 11.6 g/dL — ABNORMAL LOW (ref 12.0–15.0)
Immature Granulocytes: 0 %
Lymphocytes Relative: 16 %
Lymphs Abs: 1.2 10*3/uL (ref 0.7–4.0)
MCH: 30 pg (ref 26.0–34.0)
MCHC: 33.3 g/dL (ref 30.0–36.0)
MCV: 89.9 fL (ref 80.0–100.0)
Monocytes Absolute: 0.6 10*3/uL (ref 0.1–1.0)
Monocytes Relative: 8 %
Neutro Abs: 5.7 10*3/uL (ref 1.7–7.7)
Neutrophils Relative %: 76 %
Platelets: 201 10*3/uL (ref 150–400)
RBC: 3.87 MIL/uL (ref 3.87–5.11)
RDW: 14.1 % (ref 11.5–15.5)
WBC: 7.5 10*3/uL (ref 4.0–10.5)
nRBC: 0 % (ref 0.0–0.2)

## 2022-08-30 LAB — COMPREHENSIVE METABOLIC PANEL
ALT: 36 U/L (ref 0–44)
AST: 30 U/L (ref 15–41)
Albumin: 3.4 g/dL — ABNORMAL LOW (ref 3.5–5.0)
Alkaline Phosphatase: 122 U/L (ref 38–126)
Anion gap: 10 (ref 5–15)
BUN: 11 mg/dL (ref 8–23)
CO2: 19 mmol/L — ABNORMAL LOW (ref 22–32)
Calcium: 8.6 mg/dL — ABNORMAL LOW (ref 8.9–10.3)
Chloride: 110 mmol/L (ref 98–111)
Creatinine, Ser: 0.97 mg/dL (ref 0.44–1.00)
GFR, Estimated: 55 mL/min — ABNORMAL LOW (ref >60)
Glucose, Bld: 105 mg/dL — ABNORMAL HIGH (ref 70–99)
Potassium: 4.1 mmol/L (ref 3.5–5.1)
Sodium: 139 mmol/L (ref 135–145)
Total Bilirubin: 1 mg/dL (ref 0.3–1.2)
Total Protein: 5.9 g/dL — ABNORMAL LOW (ref 6.5–8.1)

## 2022-08-30 LAB — TROPONIN I (HIGH SENSITIVITY)
Troponin I (High Sensitivity): 17 ng/L (ref <18)
Troponin I (High Sensitivity): 18 ng/L — ABNORMAL HIGH (ref <18)

## 2022-08-30 LAB — BRAIN NATRIURETIC PEPTIDE: B Natriuretic Peptide: 980.7 pg/mL — ABNORMAL HIGH (ref 0.0–100.0)

## 2022-08-30 MED ORDER — SODIUM CHLORIDE 0.9 % IV SOLN: 250.0000 mL | INTRAVENOUS | Status: DC | PRN

## 2022-08-30 MED ORDER — ASPIRIN 81 MG PO TBEC
81.0000 mg | DELAYED_RELEASE_TABLET | Freq: Every day | ORAL | Status: DC
  Administered 2022-08-30 – 2022-09-02 (×4): 81 mg via ORAL
  Filled 2022-08-30 (×4): qty 1

## 2022-08-30 MED ORDER — ALUM & MAG HYDROXIDE-SIMETH 200-200-20 MG/5ML PO SUSP
30.0000 mL | ORAL | Status: AC
  Administered 2022-08-31: 30 mL via ORAL
  Filled 2022-08-30: qty 30

## 2022-08-30 MED ORDER — SODIUM CHLORIDE 0.9% FLUSH: 3.0000 mL | INTRAVENOUS | Status: DC | PRN

## 2022-08-30 MED ORDER — LIDOCAINE VISCOUS HCL 2 % MT SOLN
15.0000 mL | OROMUCOSAL | Status: AC
  Administered 2022-08-31: 15 mL via ORAL

## 2022-08-30 MED ORDER — DOCUSATE SODIUM 100 MG PO CAPS
100.0000 mg | ORAL_CAPSULE | Freq: Two times a day (BID) | ORAL | Status: DC
  Administered 2022-08-31 – 2022-09-01 (×4): 100 mg via ORAL
  Filled 2022-08-30 (×6): qty 1

## 2022-08-30 MED ORDER — FUROSEMIDE 10 MG/ML IJ SOLN: 40.0000 mg | Freq: Every day | INTRAMUSCULAR | Status: DC

## 2022-08-30 MED ORDER — SODIUM CHLORIDE 0.9% FLUSH
3.0000 mL | Freq: Two times a day (BID) | INTRAVENOUS | Status: DC
  Administered 2022-08-30 – 2022-09-02 (×6): 3 mL via INTRAVENOUS

## 2022-08-30 MED ORDER — PANTOPRAZOLE SODIUM 40 MG IV SOLR
40.0000 mg | INTRAVENOUS | Status: AC
  Administered 2022-08-31: 40 mg via INTRAVENOUS
  Filled 2022-08-30: qty 10

## 2022-08-30 MED ORDER — LEVALBUTEROL HCL 0.63 MG/3ML IN NEBU
0.6300 mg | INHALATION_SOLUTION | Freq: Four times a day (QID) | RESPIRATORY_TRACT | Status: DC | PRN
  Administered 2022-09-01: 0.63 mg via RESPIRATORY_TRACT
  Filled 2022-08-30: qty 3

## 2022-08-30 MED ORDER — FUROSEMIDE 10 MG/ML IJ SOLN
20.0000 mg | Freq: Once | INTRAMUSCULAR | Status: AC
  Administered 2022-08-30: 20 mg via INTRAVENOUS
  Filled 2022-08-30: qty 2

## 2022-08-30 MED ORDER — LISINOPRIL 10 MG PO TABS
10.0000 mg | ORAL_TABLET | Freq: Every day | ORAL | Status: DC
  Administered 2022-08-30 – 2022-08-31 (×2): 10 mg via ORAL
  Filled 2022-08-30 (×2): qty 1

## 2022-08-30 MED ORDER — ONDANSETRON HCL 4 MG/2ML IJ SOLN: 4.0000 mg | Freq: Four times a day (QID) | INTRAMUSCULAR | Status: DC | PRN

## 2022-08-30 MED ORDER — HYDROMORPHONE HCL 1 MG/ML IJ SOLN
0.5000 mg | INTRAMUSCULAR | Status: AC
  Administered 2022-08-31: 0.5 mg via INTRAVENOUS
  Filled 2022-08-30: qty 1

## 2022-08-30 MED ORDER — APIXABAN 5 MG PO TABS
5.0000 mg | ORAL_TABLET | Freq: Two times a day (BID) | ORAL | Status: DC
  Administered 2022-08-30 – 2022-09-02 (×6): 5 mg via ORAL
  Filled 2022-08-30 (×6): qty 1

## 2022-08-30 MED ORDER — ALUM & MAG HYDROXIDE-SIMETH 200-200-20 MG/5ML PO SUSP: 15.0000 mL | Freq: Four times a day (QID) | ORAL | Status: DC | PRN

## 2022-08-30 MED ORDER — FAMOTIDINE IN NACL 20-0.9 MG/50ML-% IV SOLN
20.0000 mg | Freq: Once | INTRAVENOUS | Status: AC
  Administered 2022-08-30: 20 mg via INTRAVENOUS
  Filled 2022-08-30: qty 50

## 2022-08-30 MED ORDER — PANTOPRAZOLE SODIUM 40 MG PO TBEC
40.0000 mg | DELAYED_RELEASE_TABLET | Freq: Every day | ORAL | Status: DC
  Administered 2022-08-30 – 2022-09-01 (×3): 40 mg via ORAL
  Filled 2022-08-30 (×3): qty 1

## 2022-08-30 MED ORDER — AMIODARONE HCL 200 MG PO TABS
400.0000 mg | ORAL_TABLET | Freq: Every day | ORAL | Status: DC
  Administered 2022-08-30 – 2022-09-02 (×4): 400 mg via ORAL
  Filled 2022-08-30 (×4): qty 2

## 2022-08-30 MED ORDER — ALUM & MAG HYDROXIDE-SIMETH 400-400-40 MG/5ML PO SUSP: 15.0000 mL | Freq: Four times a day (QID) | ORAL | Status: DC | PRN

## 2022-08-30 MED ORDER — FUROSEMIDE 10 MG/ML IJ SOLN
40.0000 mg | Freq: Every day | INTRAMUSCULAR | Status: DC
  Administered 2022-08-31 (×2): 40 mg via INTRAVENOUS
  Filled 2022-08-30 (×2): qty 4

## 2022-08-30 MED ORDER — DICYCLOMINE HCL 10 MG/5ML PO SOLN
10.0000 mg | ORAL | Status: AC
  Administered 2022-08-31: 10 mg via ORAL
  Filled 2022-08-30: qty 5

## 2022-08-30 MED ORDER — ACETAMINOPHEN 325 MG PO TABS: 650.0000 mg | ORAL_TABLET | ORAL | Status: DC | PRN

## 2022-08-30 MED ORDER — METOPROLOL TARTRATE 25 MG PO TABS
50.0000 mg | ORAL_TABLET | Freq: Two times a day (BID) | ORAL | Status: DC
  Administered 2022-08-30: 50 mg via ORAL
  Filled 2022-08-30: qty 2

## 2022-08-30 MED ORDER — METOPROLOL TARTRATE 5 MG/5ML IV SOLN
10.0000 mg | Freq: Once | INTRAVENOUS | Status: AC
  Administered 2022-08-30: 10 mg via INTRAVENOUS
  Filled 2022-08-30: qty 10

## 2022-08-30 MED ORDER — ACETAMINOPHEN 500 MG PO TABS
1000.0000 mg | ORAL_TABLET | Freq: Once | ORAL | Status: AC
  Administered 2022-08-30: 1000 mg via ORAL
  Filled 2022-08-30: qty 2

## 2022-08-30 MED ORDER — PANTOPRAZOLE SODIUM 40 MG PO TBEC: 40.0000 mg | DELAYED_RELEASE_TABLET | Freq: Every day | ORAL | Status: DC

## 2022-08-30 MED ORDER — LABETALOL HCL 5 MG/ML IV SOLN
20.0000 mg | INTRAVENOUS | Status: DC | PRN
  Administered 2022-08-30 – 2022-09-01 (×2): 20 mg via INTRAVENOUS
  Filled 2022-08-30 (×2): qty 4

## 2022-08-30 MED ORDER — ALBUTEROL SULFATE HFA 108 (90 BASE) MCG/ACT IN AERS
2.0000 | INHALATION_SPRAY | RESPIRATORY_TRACT | Status: DC | PRN
  Administered 2022-08-30: 2 via RESPIRATORY_TRACT
  Filled 2022-08-30: qty 6.7

## 2022-08-30 NOTE — H&P (Addendum)
[facility] History and Physical  Sheila Jordan [id number] DOB: 1928/08/14 DOA: 08/30/2022   PCP: Asencion Noble, MD  Specialists: Patient was recently discharged and was seen by neurology and cardiology.  She is supposed to follow-up with them in the next few weeks.  Chief Complaint: Shortness of breath  HPI: Sheila Jordan is a 88 y.o. female with a past medical history of stroke for which she was recently hospitalized and discharged on 08/29/2022.  She also was diagnosed with atrial fibrillation at that time and was seen by cardiology.  Prior to this recent admission patient did not have any chronic health problems.  She was discharged yesterday.  She felt that she was doing quite well after discharge.  She was quite happy that the left-sided weakness that she had with her stroke had resolved by the time of discharge.  She did undergo M2/MCA thrombectomy during the hospitalization.  And then over the course of the night she started developing shortness of breath.  Had some discomfort in her lower chest and upper abdomen.  The symptoms progressively worsened during the course of the night.  She went from her bed to lie in the recliner without much relief.  Might have had some orthopnea though not certain.  She has noticed some swelling in her legs but this is not worse than usual.  Since her symptoms were getting worse she decided to come back to the hospital.  She was given Pepcid and lasix.  She denies any further chest discomfort or abdominal discomfort at this time.  Denies any recent nausea or vomiting.  No fever or chills.  The cough has been dry.  Unable to tell me for sure if she gets acid reflux or not but the son seems to think that she started having the symptoms recently.  In the emergency department there was concern for pulm edema.  BNP was noted to be elevated.  Her blood pressure was noted to be quite elevated.  She will be hospitalized for further management.  Home  Medications: Prior to Admission medications   Medication Sig Start Date End Date Taking? Authorizing Provider  amiodarone (PACERONE) 400 MG tablet Take 1 tablet (400 mg total) by mouth daily. 08/29/22  Yes Shafer, Marcelino Scot, NP  apixaban (ELIQUIS) 5 MG TABS tablet Take 1 tablet (5 mg total) by mouth 2 (two) times daily. 08/28/22  Yes Janine Ores, NP  aspirin EC 81 MG tablet Take 1 tablet (81 mg total) by mouth daily. Swallow whole. 08/29/22  Yes Shafer, Marcelino Scot, NP  docusate sodium (COLACE) 100 MG capsule Take 100 mg by mouth daily as needed for mild constipation.   Yes [provider]  alum & mag hydroxide-simeth (MYLANTA MAXIMUM STRENGTH) 400-400-40 MG/5ML suspension Take 15 mLs by mouth every 6 (six) hours as needed for indigestion. 08/21/22   Leath-Warren, Alda Lea, NP  ferrous sulfate 325 (65 FE) MG EC tablet Take 1 tablet (325 mg total) by mouth 2 (two) times daily. 06/21/20 08/27/22  Antonieta Pert, MD  metoprolol tartrate (LOPRESSOR) 25 MG tablet Take 1 tablet (25 mg total) by mouth 2 (two) times daily. 08/28/22   Janine Ores, NP  Multiple Vitamin (MULTIVITAMIN) tablet Take 1 tablet by mouth daily.    [provider]  omeprazole (PRILOSEC) 20 MG capsule Take 1 capsule (20 mg total) by mouth daily. 08/21/22   Leath-Warren, Alda Lea, NP    Allergies: No Known Allergies  Past Medical History: Past Medical History:  Diagnosis Date  Arthritis    Hypertension     Past Surgical History:  Procedure Laterality Date   APPENDECTOMY     CHOLECYSTECTOMY     IR CT HEAD LTD  08/26/2022   IR PERCUTANEOUS ART THROMBECTOMY/INFUSION INTRACRANIAL INC DIAG ANGIO  08/26/2022   IR US GUIDE VASC ACCESS RIGHT  08/26/2022   RADIOLOGY WITH ANESTHESIA N/A 08/26/2022   Procedure: RADIOLOGY WITH ANESTHESIA;  Surgeon: Radiologist, Medication, MD;  Location: [facility] OR;  Service: Radiology;  Laterality: N/A;   REVERSE SHOULDER ARTHROPLASTY Right 06/19/2020   Procedure: REVERSE TOTAL SHOULDER  ARTHROPLASTY;  Surgeon: Bjorn Pippin, MD;  Location: [facility] OR;  Service: Orthopedics;  Laterality: Right;    Social History: Quite independent with daily activities.  No history of smoking alcohol use or illicit drug use.  Family History:  Family History  Problem Relation Age of Onset   Other Father        heart condition   Parkinson's disease Mother    Breast cancer Daughter      Review of Systems - History obtained from the patient General ROS: positive for  - fatigue Psychological ROS: negative Ophthalmic ROS: negative ENT ROS: negative Allergy and Immunology ROS: negative Hematological and Lymphatic ROS: negative Endocrine ROS: negative Respiratory ROS: As in HPI Cardiovascular ROS: As in HPI Gastrointestinal ROS: no abdominal pain, change in bowel habits, or black or bloody stools Genito-Urinary ROS: no dysuria, trouble voiding, or hematuria Musculoskeletal ROS: negative Neurological ROS: Recent stroke as mentioned in HPI Dermatological ROS: negative  Physical Examination  Vitals:   08/30/22 1235 08/30/22 1400 08/30/22 1512 08/30/22 1600  BP: (!) 185/73 (!) 193/77  (!) 194/73  Pulse: 74 72  63  Resp: (!) 24 (!) 29  20  Temp:   98.4 F (36.9 C)   TempSrc:   Oral   SpO2: 98% 97%  96%  Weight:      Height:        BP (!) 194/73   Pulse 63   Temp 98.4 F (36.9 C) (Oral)   Resp 20   Ht 5\' 4"  (1.626 m)   Wt 73.5 kg   SpO2 96%   BMI 27.81 kg/m   General appearance: alert, cooperative, appears stated age, and no distress Head: Normocephalic, without obvious abnormality, atraumatic Eyes: conjunctivae/corneas clear. PERRL, EOM's intact.  Throat: lips, mucosa, and tongue normal; teeth and gums normal Neck: no adenopathy, no carotid bruit, no JVD, supple, symmetrical, trachea midline, and thyroid not enlarged, symmetric, no tenderness/mass/nodules Back: symmetric, no curvature. ROM normal. No CVA tenderness. Resp: Mildly tachypneic.  Crackles bilateral bases.   Occasional wheezing.  No rhonchi. Chest wall: no tenderness Cardio: regular rate and rhythm, S1, S2 normal, no murmur, click, rub or gallop GI: soft, non-tender; bowel sounds normal; no masses,  no organomegaly Extremities: Mild edema bilateral lower extremities. Pulses: 2+ and symmetric Skin: Skin color, texture, turgor normal. No rashes or lesions Lymph nodes: Cervical, supraclavicular, and axillary nodes normal. Neurologic: Awake and alert.  Oriented x 3.  No focal neurological deficits noted at this time.   Labs on Admission: I have personally reviewed following labs and imaging studies  CBC: Recent Labs  Lab 08/26/22 0609 08/26/22 0614 08/26/22 1813 08/27/22 0343 08/30/22 1343  WBC 6.9  --   --  7.3 7.5  NEUTROABS 5.2  --   --  5.6 5.7  HGB 12.4 13.3 11.2* 11.8* 11.6*  HCT 37.1 39.0 33.0* 35.1* 34.8*  MCV 89.8  --   --  90.5 89.9  PLT 194  --   --  161 [id number]   Basic Metabolic Panel: Recent Labs  Lab 08/26/22 0609 08/26/22 0614 08/26/22 1813 08/26/22 2043 08/27/22 0343 08/30/22 1343  NA 140 142 140 142 140 139  K 3.7 3.7 3.7 3.8 3.7 4.1  CL 112* 109  --  115* 108 110  CO2 21*  --   --  19* 20* 19*  GLUCOSE 122* 120*  --  121* 118* 105*  BUN 17 17  --  12 13 11   CREATININE 1.01* 1.00  --  0.94 1.14* 0.97  CALCIUM 8.6*  --   --  7.2* 8.5* 8.6*  MG  --   --   --  1.7 2.4  --    GFR: Estimated Creatinine Clearance: 36.3 mL/min (by C-G formula based on SCr of 0.97 mg/dL). Liver Function Tests: Recent Labs  Lab 08/26/22 0609 08/26/22 2043 08/27/22 0343 08/30/22 1343  AST 38 38 37 30  ALT 38 43 47* 36  ALKPHOS 104 98 113 122  BILITOT 1.1 0.5 0.5 1.0  PROT 7.0 5.1* 6.0* 5.9*  ALBUMIN 4.2 3.1* 3.6 3.4*    Coagulation Profile: Recent Labs  Lab 08/26/22 0609  INR 1.1    CBG: Recent Labs  Lab 08/27/22 0328 08/27/22 0811 08/27/22 1118 08/27/22 1539 08/27/22 1958  GLUCAP 98 97 108* 162* 118*    Radiological Exams on Admission: DG Chest 2  View  Result Date: 08/30/2022 CLINICAL DATA:  Chest pain and shortness of breath. EXAM: CHEST - 2 VIEW COMPARISON:  Chest radiograph 08/26/2022 FINDINGS: The endotracheal tube has been removed. The patient is mildly rotated to the right. The cardiac silhouette is mildly enlarged. Aortic atherosclerosis is noted. There is increased central pulmonary vascular congestion with mild prominence of the interstitial markings. Increased airspace opacities are present in the left greater than right lung bases, and there are small bilateral pleural effusions. No pneumothorax is identified. A right shoulder arthroplasty and chronic thoracic compression fractures are noted. IMPRESSION: 1. Pulmonary vascular congestion with increased bilateral interstitial and basilar airspace opacities which may reflect edema and atelectasis. 2. Small bilateral pleural effusions. Electronically Signed   By: Logan Bores M.D.   On: 08/30/2022 10:41    My interpretation of Electrocardiogram: Sinus rhythm in the 80s.  Normal axis.  Intervals are normal.  PAC noted.  No concerning ST or T wave changes.   Problem List  Principal Problem:   Acute on chronic diastolic CHF (congestive heart failure) (HCC) Active Problems:   HTN (hypertension)   PAF (paroxysmal atrial fibrillation) (HCC)   History of stroke   Accelerated hypertension   Acute diastolic (congestive) heart failure (HCC)   Assessment: This is a 88 year old Caucasian female with past medical history as stated earlier who was discharged just yesterday after being admitted for an acute stroke and is status post thrombectomy.  Was also diagnosed with atrial fibrillation and was placed on anticoagulation.  Comes in with shortness of breath.  Since she has been on anticoagulation PE is thought to be less likely.  Chest x-ray does raise concern for volume overload with mild edema and bilateral pleural effusions.  Pneumonia is thought to be less likely considering that her WBC is  normal and that she is afebrile.   Plan: #1. Acute diastolic CHF: Recent echocardiogram showed normal systolic function of the left ventricle.  Diastolic dysfunction was noted.  BNP is noted to be elevated.  She has been given 20 mg  of furosemide.  Will give her an additional 20 mg this evening and start her on 40 mg daily from tomorrow morning.  Strict ins and outs and daily weights.  No need to repeat echocardiogram.  She is not requiring any oxygen currently.  COVID-19 and influenza PCR was negative. Chest discomfort could have been due to GERD. But will check Troponins. EKG not ischemic.  #2. Accelerated hypertension: Her blood pressure is noted to be quite significantly elevated.  When she was discharged yesterday she was discharged just on metoprolol presumably for her atrial fibrillation.  Her pressures were noted to be in the [id number] systolic yesterday morning.  Systolic blood pressures in the 190s tonight.  We will continue with metoprolol.  Will start her on lisinopril.  Labetalol as needed.  She may need additional medications.  #3. Recent embolic stroke: She is status post thrombectomy.  She was seen by neurology.  She was discharged on apixaban and aspirin.  Her LDL was noted to be 67.  It does not appear that she was discharged on statin.  She had left-sided weakness which had completely resolved after the thrombectomy.  PT evaluation will be ordered.  Will inform stroke service that the patient has been rehospitalized.  #4. Paroxysmal atrial fibrillation: Required amiodarone infusion.  She is noted to be in sinus rhythm.  She was discharged on oral amiodarone which will be continued.  Continue apixaban.  Supposed to follow-up with cardiology though I do not see any outpatient appointments yet.  Continue to monitor on telemetry for now.  #5. GERD: Her chest discomfort/abdominal discomfort possibly due to gastritis.  Symptoms resolved after she was given Pepcid.  Abdomen is benign.  Will place  her on PPI.  DVT Prophylaxis: On apixaban Code Status: DNR Family Communication: Discussed with her son who was at the bedside Disposition: Hopefully return home in improved Consults called: Will notify neurology for rehospitalization. Admission Status: Status is: Observation The patient remains OBS appropriate and will d/c before 2 midnights.    Severity of Illness: The appropriate patient status for this patient is OBSERVATION. Observation status is judged to be reasonable and necessary in order to provide the required intensity of service to ensure the patient's safety. The patient's presenting symptoms, physical exam findings, and initial radiographic and laboratory data in the context of their medical condition is felt to place them at decreased risk for further clinical deterioration. Furthermore, it is anticipated that the patient will be medically stable for discharge from the hospital within 2 midnights of admission.    Further management decisions will depend on results of further testing and patient's response to treatment.   Maliah Pyles [employer]  [facility] operational officer on [email or web address]  08/30/2022, 5:50 PM

## 2022-08-30 NOTE — ED Notes (Signed)
This RN went into room and found that patient had urinated on her sheets. This RN was able to provide patient with a full linen and gown change with assistance from [facility] ED Tech. Pericare also performed for patient at this time. Patient tolerated well. NIBP, cardiac monitor and pulse ox remain in place.

## 2022-08-30 NOTE — ED Triage Notes (Signed)
Pt arrived POV from home stating she was discharged yesterday after having a clot removed from her brain and states she cannot catch her breath.

## 2022-08-30 NOTE — ED Provider Notes (Signed)
[facility] EMERGENCY DEPARTMENT Provider Note   CSN: [id number] Arrival date & time: 08/30/22  1004     History  Chief Complaint  Patient presents with   Shortness of Breath    Sheila Jordan is a 88 y.o. female.  HPI Patient presents after discharge yesterday following presentation for stroke with reportedly successful thrombectomy procedure now with concern for ongoing dyspnea.  She is here with her son who assists with the history.  She notes that on discharge, just prior to it she was wearing oxygen, but did not continue having this at home.  She notes that she has persistent dyspnea since yesterday on discharge.  After speaking with cardiology today she was encouraged to come here for evaluation.  No fever, no chills.  There is ongoing cough, though this has been attributed to her endotracheal intubation.    Home Medications Prior to Admission medications   Medication Sig Start Date End Date Taking? Authorizing Provider  alum & mag hydroxide-simeth (MYLANTA MAXIMUM STRENGTH) 400-400-40 MG/5ML suspension Take 15 mLs by mouth every 6 (six) hours as needed for indigestion. 08/21/22   Leath-Warren, Alda Lea, NP  amiodarone (PACERONE) 400 MG tablet Take 1 tablet (400 mg total) by mouth daily. 08/29/22   Janine Ores, NP  apixaban (ELIQUIS) 5 MG TABS tablet Take 1 tablet (5 mg total) by mouth 2 (two) times daily. 08/28/22   Janine Ores, NP  aspirin EC 81 MG tablet Take 1 tablet (81 mg total) by mouth daily. Swallow whole. 08/29/22   Janine Ores, NP  docusate sodium (COLACE) 100 MG capsule Take 100 mg by mouth daily as needed for mild constipation.    [provider]  ferrous sulfate 325 (65 FE) MG EC tablet Take 1 tablet (325 mg total) by mouth 2 (two) times daily. 06/21/20 08/27/22  Antonieta Pert, MD  metoprolol tartrate (LOPRESSOR) 25 MG tablet Take 1 tablet (25 mg total) by mouth 2 (two) times daily. 08/28/22   Janine Ores, NP  Multiple Vitamin  (MULTIVITAMIN) tablet Take 1 tablet by mouth daily.    [provider]  omeprazole (PRILOSEC) 20 MG capsule Take 1 capsule (20 mg total) by mouth daily. 08/21/22   Leath-Warren, Alda Lea, NP      Allergies    Patient has no known allergies.    Review of Systems   Review of Systems  All other systems reviewed and are negative.   Physical Exam Updated Vital Signs BP (!) 193/77   Pulse 72   Temp 98.4 F (36.9 C) (Oral)   Resp (!) 29   Ht 5\' 4"  (1.626 m)   Wt 73.5 kg   SpO2 97%   BMI 27.81 kg/m  Physical Exam Vitals and nursing note reviewed.  Constitutional:      General: She is not in acute distress.    Appearance: She is well-developed.  HENT:     Head: Normocephalic and atraumatic.  Eyes:     Conjunctiva/sclera: Conjunctivae normal.  Cardiovascular:     Rate and Rhythm: Normal rate and regular rhythm.  Pulmonary:     Effort: Pulmonary effort is normal. No respiratory distress.     Breath sounds: Normal breath sounds. No stridor. No wheezing.  Abdominal:     General: There is no distension.  Skin:    General: Skin is warm and dry.  Neurological:     Mental Status: She is alert and oriented to person, place, and time.     Cranial Nerves:  No cranial nerve deficit.  Psychiatric:        Mood and Affect: Mood normal.     ED Results / Procedures / Treatments   Labs (all labs ordered are listed, but only abnormal results are displayed) Labs Reviewed  COMPREHENSIVE METABOLIC PANEL - Abnormal; Notable for the following components:      Result Value   CO2 19 (*)    Glucose, Bld 105 (*)    Calcium 8.6 (*)    Total Protein 5.9 (*)    Albumin 3.4 (*)    GFR, Estimated 55 (*)    All other components within normal limits  CBC WITH DIFFERENTIAL/PLATELET - Abnormal; Notable for the following components:   Hemoglobin 11.6 (*)    HCT 34.8 (*)    All other components within normal limits  BRAIN NATRIURETIC PEPTIDE - Abnormal; Notable for the following  components:   B Natriuretic Peptide 980.7 (*)    All other components within normal limits  RESP PANEL BY RT-PCR (FLU A&B, COVID) [id number]    EKG EKG Interpretation  Date/Time:  Friday August 30 2022 10:10:21 EST Ventricular Rate:  82 PR Interval:  144 QRS Duration: 76 QT Interval:  414 QTC Calculation: 483 R Axis:   72 Text Interpretation: Sinus rhythm with Premature supraventricular complexes Otherwise normal ECG When compared with ECG of 26-Aug-2022 12:23, PREVIOUS ECG IS PRESENT Confirmed by Gerhard Munch [phone]) on 08/30/2022 12:06:12 PM  Radiology DG Chest 2 View  Result Date: 08/30/2022 CLINICAL DATA:  Chest pain and shortness of breath. EXAM: CHEST - 2 VIEW COMPARISON:  Chest radiograph 08/26/2022 FINDINGS: The endotracheal tube has been removed. The patient is mildly rotated to the right. The cardiac silhouette is mildly enlarged. Aortic atherosclerosis is noted. There is increased central pulmonary vascular congestion with mild prominence of the interstitial markings. Increased airspace opacities are present in the left greater than right lung bases, and there are small bilateral pleural effusions. No pneumothorax is identified. A right shoulder arthroplasty and chronic thoracic compression fractures are noted. IMPRESSION: 1. Pulmonary vascular congestion with increased bilateral interstitial and basilar airspace opacities which may reflect edema and atelectasis. 2. Small bilateral pleural effusions. Electronically Signed   By: Sebastian Ache M.D.   On: 08/30/2022 10:41    Procedures Procedures    Medications Ordered in ED Medications  albuterol (VENTOLIN HFA) 108 (90 Base) MCG/ACT inhaler 2 puff (2 puffs Inhalation Given 08/30/22 1135)  famotidine (PEPCID) IVPB 20 mg premix (20 mg Intravenous New Bag/Given 08/30/22 1516)  furosemide (LASIX) injection 20 mg (has no administration in time range)  acetaminophen (TYLENOL) tablet 1,000 mg (1,000 mg Oral Given 08/30/22 1330)   metoprolol tartrate (LOPRESSOR) injection 10 mg (10 mg Intravenous Given 08/30/22 1503)    ED Course/ Medical Decision Making/ A&P                           Medical Decision Making Elderly female multiple medical issues including A-fib, recent stroke, status post intravascular thrombectomy now presents with dyspnea.  Patient is awake and alert on initial exam, has no wheezing, but given concern for dyspnea, recent resuscitation some suspicion for fluid overload versus pneumonia versus sepsis, less likely symptomatic A-fib.  Amount and/or Complexity of Data Reviewed Independent Historian:     Details: Son at bedside External Data Reviewed: notes.    Details: Substantial documentation from recent presentation for MCA stroke, status post thrombectomy Labs: ordered. Decision-making details documented in ED Course.  Radiology: ordered and independent interpretation performed. Decision-making details documented in ED Course. ECG/medicine tests:  Decision-making details documented in ED Course.  Risk OTC drugs. Prescription drug management. Decision regarding hospitalization.   3:42 PM On repeat exam patient remains hypertensive, will receive labetalol.  I reviewed x-rays, labs with patient and her son.  With pulmonary vascular congestion, increased work of breathing, though no true hypoxia, and BNP greater than 900 she will receive IV Lasix.  On reviewing her chart she had echocardiogram performed earlier this week with 50/55% ejection fraction, though with grade 2 diastolic dysfunction and there is suspicion for heart failure contributing to her tachypnea, increased work of breathing.  No early evidence for concurrent new phenomena such as pneumonia, no evidence for decompensation, but with need for IV diuretics patient admitted for further monitoring, management.        Final Clinical Impression(s) / ED Diagnoses Final diagnoses:  Acute on chronic combined systolic and diastolic  congestive heart failure ([place])     Carmin Muskrat, MD 08/30/22 [phone]

## 2022-08-30 NOTE — Telephone Encounter (Signed)
   The patient;s son Ferne Reus called the answering service after-hours today. He was not on her DPR so she gave verbal permission on the phone to speak to him. She was just recently in the hospital for a stroke and was on O2. On day of DC, O2 sat documented as wnl on RA, then 99% on 2L. She was not discharged on home O2. Do not see home O2 assessment prior to discharge. Her son was calling to find out if they could get some O2 ordered as she is having some increased SOB persistently, rather than intermittently. Unfortunately without a formal O2 assessment this is not something that can be simply called in. Furthermore I recommended that if she is having SOB, likely need re-evaluation today to exclude worsening PNA or new HF. Discussed proceeding to ER for evaluation since office is closed. Son verbalized understanding and gratitude.  Laurann Montana, PA-C

## 2022-08-30 NOTE — ED Notes (Signed)
Paged admitting provider at this time to make them aware of patient continuing to have active chest pain.

## 2022-08-30 NOTE — ED Provider Notes (Signed)
From Dr. Jeraldine Loots.  88 year old female recently in the hospital for strokelike symptoms found to have a embolic stroke that was treated with thrombectomy.  Also had some fibrillation currently on Eliquis and amiodarone.  Here with increased shortness of breath found to have some element of fluid overload. Physical Exam  BP (!) 194/73   Pulse 63   Temp 98.4 F (36.9 C) (Oral)   Resp 20   Ht 5\' 4"  (1.626 m)   Wt 73.5 kg   SpO2 96%   BMI 27.81 kg/m   Physical Exam  Procedures  Procedures  ED Course / MDM    Medical Decision Making Amount and/or Complexity of Data Reviewed Labs: ordered. Radiology: ordered.  Risk OTC drugs. Prescription drug management. Decision regarding hospitalization.   Discussed with [facility] Dr. who will evaluate the patient for admission.       Georgiana Spinner, MD 08/31/22 [phone]

## 2022-08-31 ENCOUNTER — Observation Stay (HOSPITAL_COMMUNITY): Payer: Medicare Other

## 2022-08-31 DIAGNOSIS — I10 Essential (primary) hypertension: Secondary | ICD-10-CM | POA: Diagnosis not present

## 2022-08-31 DIAGNOSIS — Z803 Family history of malignant neoplasm of breast: Secondary | ICD-10-CM | POA: Diagnosis not present

## 2022-08-31 DIAGNOSIS — J811 Chronic pulmonary edema: Secondary | ICD-10-CM | POA: Diagnosis not present

## 2022-08-31 DIAGNOSIS — J9811 Atelectasis: Secondary | ICD-10-CM | POA: Diagnosis not present

## 2022-08-31 DIAGNOSIS — R109 Unspecified abdominal pain: Secondary | ICD-10-CM | POA: Diagnosis not present

## 2022-08-31 DIAGNOSIS — K219 Gastro-esophageal reflux disease without esophagitis: Secondary | ICD-10-CM | POA: Diagnosis present

## 2022-08-31 DIAGNOSIS — I5033 Acute on chronic diastolic (congestive) heart failure: Secondary | ICD-10-CM | POA: Diagnosis present

## 2022-08-31 DIAGNOSIS — I110 Hypertensive heart disease with heart failure: Secondary | ICD-10-CM | POA: Diagnosis present

## 2022-08-31 DIAGNOSIS — D649 Anemia, unspecified: Secondary | ICD-10-CM | POA: Diagnosis present

## 2022-08-31 DIAGNOSIS — Z820 Family history of epilepsy and other diseases of the nervous system: Secondary | ICD-10-CM | POA: Diagnosis not present

## 2022-08-31 DIAGNOSIS — I69354 Hemiplegia and hemiparesis following cerebral infarction affecting left non-dominant side: Secondary | ICD-10-CM | POA: Diagnosis not present

## 2022-08-31 DIAGNOSIS — M1990 Unspecified osteoarthritis, unspecified site: Secondary | ICD-10-CM | POA: Diagnosis present

## 2022-08-31 DIAGNOSIS — Z96611 Presence of right artificial shoulder joint: Secondary | ICD-10-CM | POA: Diagnosis present

## 2022-08-31 DIAGNOSIS — E876 Hypokalemia: Secondary | ICD-10-CM | POA: Diagnosis present

## 2022-08-31 DIAGNOSIS — I480 Paroxysmal atrial fibrillation: Secondary | ICD-10-CM | POA: Diagnosis present

## 2022-08-31 DIAGNOSIS — T502X5A Adverse effect of carbonic-anhydrase inhibitors, benzothiadiazides and other diuretics, initial encounter: Secondary | ICD-10-CM | POA: Diagnosis not present

## 2022-08-31 DIAGNOSIS — Z7901 Long term (current) use of anticoagulants: Secondary | ICD-10-CM | POA: Diagnosis not present

## 2022-08-31 DIAGNOSIS — Z66 Do not resuscitate: Secondary | ICD-10-CM | POA: Diagnosis present

## 2022-08-31 DIAGNOSIS — Z1152 Encounter for screening for COVID-19: Secondary | ICD-10-CM | POA: Diagnosis not present

## 2022-08-31 DIAGNOSIS — R0602 Shortness of breath: Secondary | ICD-10-CM | POA: Diagnosis present

## 2022-08-31 DIAGNOSIS — J90 Pleural effusion, not elsewhere classified: Secondary | ICD-10-CM | POA: Diagnosis not present

## 2022-08-31 LAB — HEPATIC FUNCTION PANEL
ALT: 28 U/L (ref 0–44)
AST: 19 U/L (ref 15–41)
Albumin: 3.1 g/dL — ABNORMAL LOW (ref 3.5–5.0)
Alkaline Phosphatase: 95 U/L (ref 38–126)
Bilirubin, Direct: 0.2 mg/dL (ref 0.0–0.2)
Indirect Bilirubin: 0.8 mg/dL (ref 0.3–0.9)
Total Bilirubin: 1 mg/dL (ref 0.3–1.2)
Total Protein: 5.7 g/dL — ABNORMAL LOW (ref 6.5–8.1)

## 2022-08-31 LAB — BASIC METABOLIC PANEL
Anion gap: 13 (ref 5–15)
BUN: 12 mg/dL (ref 8–23)
CO2: 23 mmol/L (ref 22–32)
Calcium: 8.5 mg/dL — ABNORMAL LOW (ref 8.9–10.3)
Chloride: 103 mmol/L (ref 98–111)
Creatinine, Ser: 1.27 mg/dL — ABNORMAL HIGH (ref 0.44–1.00)
GFR, Estimated: 40 mL/min — ABNORMAL LOW (ref >60)
Glucose, Bld: 109 mg/dL — ABNORMAL HIGH (ref 70–99)
Potassium: 3.2 mmol/L — ABNORMAL LOW (ref 3.5–5.1)
Sodium: 139 mmol/L (ref 135–145)

## 2022-08-31 LAB — BRAIN NATRIURETIC PEPTIDE: B Natriuretic Peptide: 879 pg/mL — ABNORMAL HIGH (ref 0.0–100.0)

## 2022-08-31 LAB — LIPASE, BLOOD: Lipase: 50 U/L (ref 11–51)

## 2022-08-31 MED ORDER — POTASSIUM CHLORIDE CRYS ER 20 MEQ PO TBCR
40.0000 meq | EXTENDED_RELEASE_TABLET | Freq: Once | ORAL | Status: AC
  Administered 2022-08-31: 40 meq via ORAL
  Filled 2022-08-31: qty 2

## 2022-08-31 MED ORDER — POTASSIUM CHLORIDE 20 MEQ PO PACK
40.0000 meq | PACK | Freq: Once | ORAL | Status: AC
  Administered 2022-08-31: 40 meq via ORAL
  Filled 2022-08-31: qty 2

## 2022-08-31 MED ORDER — METOPROLOL TARTRATE 25 MG PO TABS
25.0000 mg | ORAL_TABLET | Freq: Two times a day (BID) | ORAL | Status: DC
  Administered 2022-08-31 – 2022-09-02 (×4): 25 mg via ORAL
  Filled 2022-08-31 (×4): qty 1

## 2022-08-31 MED ORDER — IOHEXOL 9 MG/ML PO SOLN
ORAL | Status: AC
  Filled 2022-08-31: qty 500

## 2022-08-31 NOTE — Progress Notes (Signed)
[facility] PROGRESS NOTE   Sheila Jordan [id number] DOB: 02/19/1928 DOA: 08/30/2022  PCP: Asencion Noble, MD  Brief History/Interval Summary: 88 y.o. female with a past medical history of stroke for which she was recently hospitalized and discharged on 08/29/2022.  She also was diagnosed with atrial fibrillation at that time and was seen by cardiology.  Prior to this recent admission patient did not have any chronic health problems.  She presented with progressively worsening shortness of breath and upper abdominal and lower chest pain.  She was hospitalized due to concern for fluid overload.   Consultants: None  Procedures: None    Subjective/Interval History: Patient mentions that she again had abdominal discomfort overnight but was relieved after she was given Protonix and Bentyl.  Shortness of breath is about the same as yesterday.  No nausea or vomiting.  Son is at the bedside.    Assessment/Plan:  Acute diastolic CHF Recent echocardiogram showed normal systolic function but diastolic dysfunction was noted.  BNP was noted to be elevated.  She was given furosemide yesterday with good diuresis.  Will give additional dose of furosemide today.  Increasing creatinine as noted. No need to repeat echocardiogram. COVID-19 and influenza PCR's were negative. Troponin levels unremarkable.  EKG did not show any ischemia. Continue with strict ins and outs and daily weights.  Start mobilizing. Chest x-ray shows improvement in pulmonary edema.  Accelerated hypertension Blood pressure was quite elevated yesterday with systolic in the [id number].  Seems to have improved overnight.  Continue with metoprolol.  Started also on lisinopril overnight.  Recent embolic strokes status post thrombectomy Seen by neurology.  She is on apixaban and aspirin.  LDL was 67. Her left-sided weakness has completely resolved.  PT evaluation.  Paroxysmal atrial fibrillation Required amiodarone infusion  during previous hospitalization.  Currently in sinus rhythm.  Continue oral amiodarone.  Continue apixaban. Sinus bradycardia is noted.  Continue to monitor for now without any changes to her antihypertensives.  GERD/upper abdominal pain Symptoms are suggestive of GERD.  Could also be due to CHF as mentioned above but does not appear to be ischemic type of pain since troponin levels unremarkable.  Continue with PPI for now.  Will check LFTs and lipase.  Proceed with CT scan of the abdomen pelvis since the symptoms appear to be relatively new for the patient.  Hypokalemia Likely due to diuresis.  Will supplement potassium.  Normocytic anemia No evidence of overt bleeding.  Continue to monitor.  DVT Prophylaxis: On apixaban Code Status: DNR Family Communication: Discussed with patient and her son Disposition Plan: Hopefully return home when improved  Status is: Observation The patient will require care spanning > 2 midnights and should be moved to inpatient because: Acute diastolic CHF      Medications: Scheduled:  amiodarone  400 mg Oral Daily   apixaban  5 mg Oral BID   aspirin EC  81 mg Oral Daily   clobetasol cream   Topical BID   docusate sodium  100 mg Oral BID   furosemide  40 mg Intravenous Daily   lisinopril  10 mg Oral Daily   metoprolol tartrate  50 mg Oral BID   pantoprazole  40 mg Oral Q1200   potassium chloride  40 mEq Oral Once   sodium chloride flush  3 mL Intravenous Q12H   Continuous:  sodium chloride     FN:[id number] chloride, acetaminophen, alum & mag hydroxide-simeth, labetalol, levalbuterol, ondansetron (ZOFRAN) IV, sodium chloride flush  Antibiotics: Anti-infectives (  From admission, onward)    None       Objective:  Vital Signs  Vitals:   08/31/22 0330 08/31/22 0342 08/31/22 0500 08/31/22 0530  BP: 135/60  138/67 135/73  Pulse: (!) 49  (!) 49 (!) 50  Resp: 15  16 18   Temp:  98.3 F (36.8 C)    TempSrc:  Oral    SpO2: 98%  97% 100%   Weight:      Height:        Intake/Output Summary (Last 24 hours) at 08/31/2022 0906 Last data filed at 08/31/2022 0453 Gross per 24 hour  Intake 51.75 ml  Output 2500 ml  Net -2448.25 ml   Filed Weights   08/30/22 1012  Weight: 73.5 kg    General appearance: Awake alert.  In no distress Resp: Slightly improved air entry bilaterally.  Less tachypneic compared to yesterday.  Continues to have crackles at the bases.  No wheezing or rhonchi. Cardio: S1-S2 is normal regular.  No S3-S4.  No rubs murmurs or bruit GI: Abdomen is soft.  Remains tender in the epigastric area without any rebound rigidity or guarding.  No masses organomegaly. Extremities:  Moving all of her extremities.  Mild edema is noted. Neurologic: No focal neurological deficits.    Lab Results:  Data Reviewed: I have personally reviewed following labs and reports of the imaging studies  CBC: Recent Labs  Lab 08/26/22 0609 08/26/22 0614 08/26/22 1813 08/27/22 0343 08/30/22 1343  WBC 6.9  --   --  7.3 7.5  NEUTROABS 5.2  --   --  5.6 5.7  HGB 12.4 13.3 11.2* 11.8* 11.6*  HCT 37.1 39.0 33.0* 35.1* 34.8*  MCV 89.8  --   --  90.5 89.9  PLT 194  --   --  161 [id number]    Basic Metabolic Panel: Recent Labs  Lab 08/26/22 0609 08/26/22 0614 08/26/22 1813 08/26/22 2043 08/27/22 0343 08/30/22 1343 08/31/22 0648  NA 140 142 140 142 140 139 139  K 3.7 3.7 3.7 3.8 3.7 4.1 3.2*  CL 112* 109  --  115* 108 110 103  CO2 21*  --   --  19* 20* 19* 23  GLUCOSE 122* 120*  --  121* 118* 105* 109*  BUN 17 17  --  12 13 11 12   CREATININE 1.01* 1.00  --  0.94 1.14* 0.97 1.27*  CALCIUM 8.6*  --   --  7.2* 8.5* 8.6* 8.5*  MG  --   --   --  1.7 2.4  --   --     GFR: Estimated Creatinine Clearance: 27.8 mL/min (A) (by C-G formula based on SCr of 1.27 mg/dL (H)).  Liver Function Tests: Recent Labs  Lab 08/26/22 0609 08/26/22 2043 08/27/22 0343 08/30/22 1343  AST 38 38 37 30  ALT 38 43 47* 36  ALKPHOS 104 98 113  122  BILITOT 1.1 0.5 0.5 1.0  PROT 7.0 5.1* 6.0* 5.9*  ALBUMIN 4.2 3.1* 3.6 3.4*     Coagulation Profile: Recent Labs  Lab 08/26/22 0609  INR 1.1    CBG: Recent Labs  Lab 08/27/22 0328 08/27/22 0811 08/27/22 1118 08/27/22 1539 08/27/22 1958  GLUCAP 98 97 108* 162* 118*     Recent Results (from the past 240 hour(s))  Resp Panel by RT-PCR (Flu A&B, Covid) Anterior Nasal Swab     Status: None   Collection Time: 08/26/22  8:00 AM   Specimen: Anterior Nasal Swab  Result Value  Ref Range Status   SARS Coronavirus 2 by RT PCR NEGATIVE NEGATIVE Final    Comment: (NOTE) SARS-CoV-2 target nucleic acids are NOT DETECTED.  The SARS-CoV-2 RNA is generally detectable in upper respiratory specimens during the acute phase of infection. The lowest concentration of SARS-CoV-2 viral copies this assay can detect is 138 copies/mL. A negative result does not preclude SARS-Cov-2 infection and should not be used as the sole basis for treatment or other patient management decisions. A negative result may occur with  improper specimen collection/handling, submission of specimen other than nasopharyngeal swab, presence of viral mutation(s) within the areas targeted by this assay, and inadequate number of viral copies(<138 copies/mL). A negative result must be combined with clinical observations, patient history, and epidemiological information. The expected result is Negative.  Fact Sheet for Patients:  EntrepreneurPulse.com.au  Fact Sheet for Healthcare Providers:  IncredibleEmployment.be  This test is no t yet approved or cleared by the [place] FDA and  has been authorized for detection and/or diagnosis of SARS-CoV-2 by FDA under an Emergency Use Authorization (EUA). This EUA will remain  in effect (meaning this test can be used) for the duration of the COVID-19 declaration under Section 564(b)(1) of the Act, 21 U.S.C.section 360bbb-3(b)(1),  unless the authorization is terminated  or revoked sooner.       Influenza A by PCR NEGATIVE NEGATIVE Final   Influenza B by PCR NEGATIVE NEGATIVE Final    Comment: (NOTE) The Xpert Xpress SARS-CoV-2/FLU/RSV plus assay is intended as an aid in the diagnosis of influenza from Nasopharyngeal swab specimens and should not be used as a sole basis for treatment. Nasal washings and aspirates are unacceptable for Xpert Xpress SARS-CoV-2/FLU/RSV testing.  Fact Sheet for Patients: EntrepreneurPulse.com.au  Fact Sheet for Healthcare Providers: IncredibleEmployment.be  This test is not yet approved or cleared by the [place] FDA and has been authorized for detection and/or diagnosis of SARS-CoV-2 by FDA under an Emergency Use Authorization (EUA). This EUA will remain in effect (meaning this test can be used) for the duration of the COVID-19 declaration under Section 564(b)(1) of the Act, 21 U.S.C. section 360bbb-3(b)(1), unless the authorization is terminated or revoked.  Performed at [facility], [address] [place], [place] [address]   MRSA Next Gen by PCR, Nasal     Status: None   Collection Time: 08/26/22 11:45 AM   Specimen: Nasal Mucosa; Nasal Swab  Result Value Ref Range Status   MRSA by PCR Next Gen NOT DETECTED NOT DETECTED Final    Comment: (NOTE) The GeneXpert MRSA Assay (FDA approved for NASAL specimens only), is one component of a comprehensive MRSA colonization surveillance program. It is not intended to diagnose MRSA infection nor to guide or monitor treatment for MRSA infections. Test performance is not FDA approved in patients less than 33 years old. Performed at [facility], [place] [address] [place], [place] [address]   Resp Panel by RT-PCR (Flu A&B, Covid) Anterior Nasal Swab     Status: None   Collection Time: 08/30/22 11:15 AM   Specimen: Anterior Nasal Swab  Result Value Ref Range Status   SARS Coronavirus 2  by RT PCR NEGATIVE NEGATIVE Final    Comment: (NOTE) SARS-CoV-2 target nucleic acids are NOT DETECTED.  The SARS-CoV-2 RNA is generally detectable in upper respiratory specimens during the acute phase of infection. The lowest concentration of SARS-CoV-2 viral copies this assay can detect is 138 copies/mL. A negative result does not preclude SARS-Cov-2 infection and  should not be used as the sole basis for treatment or other patient management decisions. A negative result may occur with  improper specimen collection/handling, submission of specimen other than nasopharyngeal swab, presence of viral mutation(s) within the areas targeted by this assay, and inadequate number of viral copies(<138 copies/mL). A negative result must be combined with clinical observations, patient history, and epidemiological information. The expected result is Negative.  Fact Sheet for Patients:  EntrepreneurPulse.com.au  Fact Sheet for Healthcare Providers:  IncredibleEmployment.be  This test is no t yet approved or cleared by the [place] FDA and  has been authorized for detection and/or diagnosis of SARS-CoV-2 by FDA under an Emergency Use Authorization (EUA). This EUA will remain  in effect (meaning this test can be used) for the duration of the COVID-19 declaration under Section 564(b)(1) of the Act, 21 U.S.C.section 360bbb-3(b)(1), unless the authorization is terminated  or revoked sooner.       Influenza A by PCR NEGATIVE NEGATIVE Final   Influenza B by PCR NEGATIVE NEGATIVE Final    Comment: (NOTE) The Xpert Xpress SARS-CoV-2/FLU/RSV plus assay is intended as an aid in the diagnosis of influenza from Nasopharyngeal swab specimens and should not be used as a sole basis for treatment. Nasal washings and aspirates are unacceptable for Xpert Xpress SARS-CoV-2/FLU/RSV testing.  Fact Sheet for Patients: EntrepreneurPulse.com.au  Fact  Sheet for Healthcare Providers: IncredibleEmployment.be  This test is not yet approved or cleared by the [place] FDA and has been authorized for detection and/or diagnosis of SARS-CoV-2 by FDA under an Emergency Use Authorization (EUA). This EUA will remain in effect (meaning this test can be used) for the duration of the COVID-19 declaration under Section 564(b)(1) of the Act, 21 U.S.C. section 360bbb-3(b)(1), unless the authorization is terminated or revoked.  Performed at [facility], [place] [address], [place], [place] [address]       Radiology Studies: DG Chest [place] 1 View  Result Date: 08/31/2022 CLINICAL DATA:  88 year old female with recent pulmonary edema. EXAM: PORTABLE CHEST 1 VIEW COMPARISON:  Chest radiographs 08/30/2022 and earlier. FINDINGS: Portable AP upright view at 0746 hours. Stable lung volumes. Stable cardiac size and mediastinal contours. Pulmonary vascularity appears regressed since yesterday. Small bilateral pleural effusions are less apparent. Trace pleural fluid now in the right minor fissure. No pneumothorax or consolidation. Calcified aortic atherosclerosis. Stable visualized osseous structures. Right shoulder arthroplasty. Negative visible bowel gas pattern. IMPRESSION: Evidence of regressed interstitial edema and small pleural effusions since yesterday. No new cardiopulmonary abnormality. Aortic Atherosclerosis (ICD10-I70.0). Electronically Signed   By: Genevie Ann M.D.   On: 08/31/2022 07:59   DG Chest 2 View  Result Date: 08/30/2022 CLINICAL DATA:  Chest pain and shortness of breath. EXAM: CHEST - 2 VIEW COMPARISON:  Chest radiograph 08/26/2022 FINDINGS: The endotracheal tube has been removed. The patient is mildly rotated to the right. The cardiac silhouette is mildly enlarged. Aortic atherosclerosis is noted. There is increased central pulmonary vascular congestion with mild prominence of the interstitial markings. Increased  airspace opacities are present in the left greater than right lung bases, and there are small bilateral pleural effusions. No pneumothorax is identified. A right shoulder arthroplasty and chronic thoracic compression fractures are noted. IMPRESSION: 1. Pulmonary vascular congestion with increased bilateral interstitial and basilar airspace opacities which may reflect edema and atelectasis. 2. Small bilateral pleural effusions. Electronically Signed   By: Logan Bores M.D.   On: 08/30/2022 10:41       LOS: 0  days   Sheila Jordan  [facility] Pager on [email or web address]  08/31/2022, 9:06 AM

## 2022-08-31 NOTE — Progress Notes (Signed)
Physical Therapy Evaluation Patient Details Name: Sheila Jordan MRN: [id number] DOB: 04-Aug-1928 Today's Date: 08/31/2022  History of Present Illness  Pt is a 88 y/o female presenting 11/24 with worsening SOB and upper abdominal and lower chest pain.  Work up includes dCHF with fluid overload.  PMH:  HTN, stroke  Clinical Impression  Pt admitted with/for SOB, fluid overload and dCHF.  Pt not quite at baseline function, needing min to min guard assist for mobility OOB and gait.Marland Kitchen  Pt currently limited functionally due to the problems listed below.  (see problems list.)  Pt will benefit from PT to maximize function and safety to be able to get home safely with available assist .        Recommendations for follow up therapy are one component of a multi-disciplinary discharge planning process, led by the attending physician.  Recommendations may be updated based on patient status, additional functional criteria and insurance authorization.  Follow Up Recommendations No PT follow up      Assistance Recommended at Discharge Set up Supervision/Assistance  Patient can return home with the following  A little help with bathing/dressing/bathroom;Help with stairs or ramp for entrance;Assistance with cooking/housework;Assist for transportation    Equipment Recommendations None recommended by PT  Recommendations for Other Services       Functional Status Assessment Patient has had a recent decline in their functional status and demonstrates the ability to make significant improvements in function in a reasonable and predictable amount of time.     Precautions / Restrictions Precautions Precautions: Fall      Mobility  Bed Mobility Overal bed mobility: Modified Independent             General bed mobility comments: in chair on arrival    Transfers Overall transfer level: Needs assistance Equipment used: 1 person hand held assist Transfers: Sit to/from Stand Sit to Stand: Min  guard           General transfer comment: used UE's appropriately, no assist ascending or descending    Ambulation/Gait Ambulation/Gait assistance: Min guard, Min assist Gait Distance (Feet): 180 Feet Assistive device: 1 person hand held assist, None Gait Pattern/deviations: Decreased step length - left, Leaning posteriorly, Trunk flexed, Narrow base of support, Step-through pattern, Decreased step length - right, Decreased stride length       General Gait Details: mild unsteadiness with stiffness initially, then improved stability, some guardedness, generally slower.  VSS throughout, sats 98% on RA, but dyspnea  2+/4  Stairs            Wheelchair Mobility    Modified Rankin (Stroke Patients Only) Modified Rankin (Stroke Patients Only) Pre-Morbid Rankin Score: No symptoms Modified Rankin: Moderately severe disability     Balance Overall balance assessment: Needs assistance Sitting-balance support: No upper extremity supported, Feet unsupported Sitting balance-Leahy Scale: Good     Standing balance support: Bilateral upper extremity supported, During functional activity Standing balance-Leahy Scale: Fair Standing balance comment: preferring stationary surface for support after some time without assist                             Pertinent Vitals/Pain Pain Assessment Pain Assessment: No/denies pain    Home Living Family/patient expects to be discharged to:: Private residence Living Arrangements: Alone Available Help at Discharge: Family Type of Home: House Home Access: Ramped entrance       Home Layout: Two level;Able to live on main level with bedroom/bathroom  Home Equipment: Agricultural consultant (2 wheels);Cane - single point;Wheelchair - manual;Grab bars - toilet;Grab bars - tub/shower Additional Comments: 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     Prior Function Prior Level of Function : Independent/Modified Independent;Driving             Mobility Comments: no AD for amb ADLs Comments: does her own grocery shopping, cooking, and housework     Hand Dominance   Dominant Hand: Right    Extremity/Trunk Assessment   Upper Extremity Assessment Upper Extremity Assessment: Overall WFL for tasks assessed    Lower Extremity Assessment Lower Extremity Assessment: Overall WFL for tasks assessed    Cervical / Trunk Assessment Cervical / Trunk Assessment: Kyphotic  Communication   Communication: No difficulties  Cognition Arousal/Alertness: Awake/alert Behavior During Therapy: WFL for tasks assessed/performed Overall Cognitive Status: Within Functional Limits for tasks assessed                                          General Comments General comments (skin integrity, edema, etc.): VSS    Exercises     Assessment/Plan    PT Assessment Patient needs continued PT services  PT Problem List Decreased activity tolerance;Decreased balance;Decreased mobility;Cardiopulmonary status limiting activity       PT Treatment Interventions Functional mobility training;Balance training;Patient/family education;Therapeutic activities;Gait training;Therapeutic exercise    PT Goals (Current goals can be found in the Care Plan section)  Acute Rehab PT Goals Patient Stated Goal: home PT Goal Formulation: With patient Time For Goal Achievement: 09/06/22 Potential to Achieve Goals: Good    Frequency Min 3X/week     Co-evaluation               AM-PAC PT "6 Clicks" Mobility  Outcome Measure Help needed turning from your back to your side while in a flat bed without using bedrails?: None Help needed moving from lying on your back to sitting on the side of a flat bed without using bedrails?: None Help needed moving to and from a bed to a chair (including a wheelchair)?: A Little Help needed standing up from  a chair using your arms (e.g., wheelchair or bedside chair)?: A Little Help needed to walk in hospital room?: A Little Help needed climbing 3-5 steps with a railing? : A Little 6 Click Score: 20    End of Session   Activity Tolerance: Patient limited by fatigue;Patient tolerated treatment well Patient left: with call bell/phone within reach;with family/visitor present;in bed Nurse Communication: Mobility status PT Visit Diagnosis: Difficulty in walking, not elsewhere classified (R26.2)    Time: 1050-1109 PT Time Calculation (min) (ACUTE ONLY): 19 min   Charges:   PT Evaluation $PT Eval Moderate Complexity: 1 Mod          08/31/2022  Jacinto Halim., PT Acute Rehabilitation Services [phone]  (office)  Sheila Jordan 08/31/2022, 11:22 AM

## 2022-08-31 NOTE — ED Notes (Signed)
Pt's son given update via phone

## 2022-08-31 NOTE — ED Notes (Signed)
Pt is not tolerating the powder potassium. Provider has been contacted about changing to 40 meq in pills per pt request

## 2022-08-31 NOTE — ED Notes (Signed)
ED TO INPATIENT HANDOFF REPORT  ED Nurse Name and Phone #: Iona Coach Name/Age/Gender Sheila Jordan 88 y.o. female Room/Bed: 039C/039C  Code Status   Code Status: DNR  Home/SNF/Other Home Patient oriented to: self, place, time, and situation Is this baseline? Yes   Triage Complete: Triage complete  Chief Complaint Acute diastolic (congestive) heart failure (HCC) [I50.31]  Triage Note Pt arrived POV from home stating she was discharged yesterday after having a clot removed from her brain and states she cannot catch her breath.    Allergies No Known Allergies  Level of Care/Admitting Diagnosis ED Disposition     ED Disposition  Admit   Condition  --   Comment  Hospital Area: [facility] [[id number]]  Level of Care: Telemetry Cardiac [[id number]]  May admit patient to [facility] or [facility] if equivalent level of care is available:: No  Covid Evaluation: Confirmed COVID Negative  Diagnosis: Acute diastolic (congestive) heart failure [facility]) [id number]  Admitting Physician: Bonnielee Haff [[id number]]  Attending Physician: Bonnielee Haff [id number]  Certification:: I certify this patient will need inpatient services for at least 2 midnights          B Medical/Surgery History Past Medical History:  Diagnosis Date   Arthritis    Hypertension    Past Surgical History:  Procedure Laterality Date   APPENDECTOMY     CHOLECYSTECTOMY     IR CT HEAD LTD  08/26/2022   IR PERCUTANEOUS ART THROMBECTOMY/INFUSION INTRACRANIAL INC DIAG ANGIO  08/26/2022   IR US GUIDE VASC ACCESS RIGHT  08/26/2022   RADIOLOGY WITH ANESTHESIA N/A 08/26/2022   Procedure: RADIOLOGY WITH ANESTHESIA;  Surgeon: Radiologist, Medication, MD;  Location: [place];  Service: Radiology;  Laterality: N/A;   REVERSE SHOULDER ARTHROPLASTY Right 06/19/2020   Procedure: REVERSE TOTAL SHOULDER ARTHROPLASTY;  Surgeon: Hiram Gash, MD;  Location: [place];  Service: Orthopedics;  Laterality: Right;     A IV  Location/Drains/Wounds Patient Lines/Drains/Airways Status     Active Line/Drains/Airways     Name Placement date Placement time Site Days   Peripheral IV 08/30/22 20 G Left Antecubital 08/30/22  1338  Antecubital  1   Incision (Closed) 06/19/20 Arm Right 06/19/20  1537  -- 803            Intake/Output Last 24 hours  Intake/Output Summary (Last 24 hours) at 08/31/2022 1704 Last data filed at 08/31/2022 0453 Gross per 24 hour  Intake --  Output 2500 ml  Net -2500 ml    Labs/Imaging Results for orders placed or performed during the hospital encounter of 08/30/22 (from the past 48 hour(s))  Resp Panel by RT-PCR (Flu A&B, Covid) Anterior Nasal Swab     Status: None   Collection Time: 08/30/22 11:15 AM   Specimen: Anterior Nasal Swab  Result Value Ref Range   SARS Coronavirus 2 by RT PCR NEGATIVE NEGATIVE    Comment: (NOTE) SARS-CoV-2 target nucleic acids are NOT DETECTED.  The SARS-CoV-2 RNA is generally detectable in upper respiratory specimens during the acute phase of infection. The lowest concentration of SARS-CoV-2 viral copies this assay can detect is 138 copies/mL. A negative result does not preclude SARS-Cov-2 infection and should not be used as the sole basis for treatment or other patient management decisions. A negative result may occur with  improper specimen collection/handling, submission of specimen other than nasopharyngeal swab, presence of viral mutation(s) within the areas targeted by this assay, and inadequate number of viral copies(<138 copies/mL). A  negative result must be combined with clinical observations, patient history, and epidemiological information. The expected result is Negative.  Fact Sheet for Patients:  BloggerCourse.com  Fact Sheet for Healthcare Providers:  SeriousBroker.it  This test is no t yet approved or cleared by the [place] FDA and  has been authorized for detection  and/or diagnosis of SARS-CoV-2 by FDA under an Emergency Use Authorization (EUA). This EUA will remain  in effect (meaning this test can be used) for the duration of the COVID-19 declaration under Section 564(b)(1) of the Act, 21 U.S.C.section 360bbb-3(b)(1), unless the authorization is terminated  or revoked sooner.       Influenza A by PCR NEGATIVE NEGATIVE   Influenza B by PCR NEGATIVE NEGATIVE    Comment: (NOTE) The Xpert Xpress SARS-CoV-2/FLU/RSV plus assay is intended as an aid in the diagnosis of influenza from Nasopharyngeal swab specimens and should not be used as a sole basis for treatment. Nasal washings and aspirates are unacceptable for Xpert Xpress SARS-CoV-2/FLU/RSV testing.  Fact Sheet for Patients: BloggerCourse.com  Fact Sheet for Healthcare Providers: SeriousBroker.it  This test is not yet approved or cleared by the [place] FDA and has been authorized for detection and/or diagnosis of SARS-CoV-2 by FDA under an Emergency Use Authorization (EUA). This EUA will remain in effect (meaning this test can be used) for the duration of the COVID-19 declaration under Section 564(b)(1) of the Act, 21 U.S.C. section 360bbb-3(b)(1), unless the authorization is terminated or revoked.  Performed at [facility], [address] [place], [place] [address]   Comprehensive metabolic panel     Status: Abnormal   Collection Time: 08/30/22  1:43 PM  Result Value Ref Range   Sodium 139 135 - 145 mmol/L   Potassium 4.1 3.5 - 5.1 mmol/L   Chloride 110 98 - 111 mmol/L   CO2 19 (L) 22 - 32 mmol/L   Glucose, Bld 105 (H) 70 - 99 mg/dL    Comment: Glucose reference range applies only to samples taken after fasting for at least 8 hours.   BUN 11 8 - 23 mg/dL   Creatinine, Ser 2.11 0.44 - 1.00 mg/dL   Calcium 8.6 (L) 8.9 - 10.3 mg/dL   Total Protein 5.9 (L) 6.5 - 8.1 g/dL   Albumin 3.4 (L) 3.5 - 5.0 g/dL   AST 30 15 -  41 U/L   ALT 36 0 - 44 U/L   Alkaline Phosphatase 122 38 - 126 U/L   Total Bilirubin 1.0 0.3 - 1.2 mg/dL   GFR, Estimated 55 (L) >60 mL/min    Comment: (NOTE) Calculated using the CKD-EPI Creatinine Equation (2021)    Anion gap 10 5 - 15    Comment: Performed at [facility], [address], [place], [place] [address]  CBC with Differential/Platelet     Status: Abnormal   Collection Time: 08/30/22  1:43 PM  Result Value Ref Range   WBC 7.5 4.0 - 10.5 K/uL   RBC 3.87 3.87 - 5.11 MIL/uL   Hemoglobin 11.6 (L) 12.0 - 15.0 g/dL   HCT 08.1 (L) 44.8 - 18.5 %   MCV 89.9 80.0 - 100.0 fL   MCH 30.0 26.0 - 34.0 pg   MCHC 33.3 30.0 - 36.0 g/dL   RDW 63.1 49.7 - 02.6 %   Platelets 201 150 - 400 K/uL   nRBC 0.0 0.0 - 0.2 %   Neutrophils Relative % 76 %   Neutro Abs 5.7 1.7 - 7.7  K/uL   Lymphocytes Relative 16 %   Lymphs Abs 1.2 0.7 - 4.0 K/uL   Monocytes Relative 8 %   Monocytes Absolute 0.6 0.1 - 1.0 K/uL   Eosinophils Relative 0 %   Eosinophils Absolute 0.0 0.0 - 0.5 K/uL   Basophils Relative 0 %   Basophils Absolute 0.0 0.0 - 0.1 K/uL   Immature Granulocytes 0 %   Abs Immature Granulocytes 0.03 0.00 - 0.07 K/uL    Comment: Performed at [facility], [place] [address], [place], [place] [address]  Brain natriuretic peptide     Status: Abnormal   Collection Time: 08/30/22  1:43 PM  Result Value Ref Range   B Natriuretic Peptide 980.7 (H) 0.0 - 100.0 pg/mL    Comment: Performed at [facility] [address], [place], [place] [address]  Troponin I (High Sensitivity)     Status: None   Collection Time: 08/30/22  7:07 PM  Result Value Ref Range   Troponin I (High Sensitivity) 17 <18 ng/L    Comment: (NOTE) Elevated high sensitivity troponin I (hsTnI) values and significant  changes across serial measurements may suggest ACS but many other  chronic and acute conditions are known to elevate hsTnI results.  Refer to the "Links" section for chest pain algorithms and  additional  guidance. Performed at [facility], [place] [address], [place], [place] [address]   Troponin I (High Sensitivity)     Status: Abnormal   Collection Time: 08/30/22  8:53 PM  Result Value Ref Range   Troponin I (High Sensitivity) 18 (H) <18 ng/L    Comment: (NOTE) Elevated high sensitivity troponin I (hsTnI) values and significant  changes across serial measurements may suggest ACS but many other  chronic and acute conditions are known to elevate hsTnI results.  Refer to the "Links" section for chest pain algorithms and additional  guidance. Performed at [facility], [place] [address] [place], [place] [id number]   Basic metabolic panel     Status: Abnormal   Collection Time: 08/31/22  6:48 AM  Result Value Ref Range   Sodium 139 135 - 145 mmol/L   Potassium 3.2 (L) 3.5 - 5.1 mmol/L   Chloride 103 98 - 111 mmol/L   CO2 23 22 - 32 mmol/L   Glucose, Bld 109 (H) 70 - 99 mg/dL    Comment: Glucose reference range applies only to samples taken after fasting for at least 8 hours.   BUN 12 8 - 23 mg/dL   Creatinine, Ser 1.27 (H) 0.44 - 1.00 mg/dL   Calcium 8.5 (L) 8.9 - 10.3 mg/dL   GFR, Estimated 40 (L) >60 mL/min    Comment: (NOTE) Calculated using the CKD-EPI Creatinine Equation (2021)    Anion gap 13 5 - 15    Comment: Performed at [facility] [address] [place], [place] [address]  Brain natriuretic peptide     Status: Abnormal   Collection Time: 08/31/22  6:48 AM  Result Value Ref Range   B Natriuretic Peptide 879.0 (H) 0.0 - 100.0 pg/mL    Comment: Performed at [facility] [address], [place], [place] [address]   DG Chest Port 1 View  Result Date: 08/31/2022 CLINICAL DATA:  88 year old female with recent pulmonary edema. EXAM: PORTABLE CHEST 1 VIEW COMPARISON:  Chest radiographs 08/30/2022 and earlier. FINDINGS: Portable AP upright view at 0746 hours. Stable lung volumes. Stable cardiac size and mediastinal contours. Pulmonary  vascularity appears regressed since yesterday. Small bilateral pleural effusions are less apparent. Trace pleural fluid now in the right minor fissure. No pneumothorax or consolidation. Calcified aortic atherosclerosis. Stable visualized osseous structures. Right shoulder arthroplasty. Negative visible bowel gas pattern. IMPRESSION: Evidence of regressed interstitial edema and small pleural effusions since yesterday. No new cardiopulmonary abnormality. Aortic Atherosclerosis (ICD10-I70.0). Electronically Signed   By: Genevie Ann M.D.   On: 08/31/2022 07:59   DG Chest 2 View  Result Date: 08/30/2022 CLINICAL DATA:  Chest pain and shortness of breath. EXAM: CHEST - 2 VIEW COMPARISON:  Chest radiograph 08/26/2022 FINDINGS: The endotracheal tube has been removed. The patient is mildly rotated to the right. The cardiac silhouette is mildly enlarged. Aortic atherosclerosis is noted. There is increased central pulmonary vascular congestion with mild prominence of the interstitial markings. Increased airspace opacities are present in the left greater than right lung bases, and there are small bilateral pleural effusions. No pneumothorax is identified. A right shoulder arthroplasty and chronic thoracic compression fractures are noted. IMPRESSION: 1. Pulmonary vascular congestion with increased bilateral interstitial and basilar airspace opacities which may reflect edema and atelectasis. 2. Small bilateral pleural effusions. Electronically Signed   By: Logan Bores M.D.   On: 08/30/2022 10:41    Pending Labs Unresulted Labs (From admission, onward)     Start     Ordered   09/01/22 0500  Magnesium  Tomorrow morning,   R        08/31/22 1346   09/01/22 0500  Comprehensive metabolic panel  Tomorrow morning,   R        08/31/22 1406   09/01/22 0500  Lipase, blood  Tomorrow morning,   R        08/31/22 1406   08/31/22 0840  Hepatic function panel  Add-on,   AD        08/31/22 0839   08/31/22 0840  Lipase, blood   Add-on,   AD        08/31/22 0839            Vitals/Pain Today's Vitals   08/31/22 1300 08/31/22 1330 08/31/22 1345 08/31/22 1400  BP: (!) 155/60 (!) 154/138  (!) 158/88  Pulse: 60 65  69  Resp: 16     Temp:      TempSrc:      SpO2: 99% 99%  98%  Weight:      Height:      PainSc:   0-No pain     Isolation Precautions No active isolations  Medications Medications  levalbuterol (XOPENEX) nebulizer solution 0.63 mg (has no administration in time range)  sodium chloride flush (NS) 0.9 % injection 3 mL (3 mLs Intravenous Given 08/31/22 1039)  sodium chloride flush (NS) 0.9 % injection 3 mL (has no administration in time range)  0.9 %  sodium chloride infusion (has no administration in time range)  acetaminophen (TYLENOL) tablet 650 mg (has no administration in time range)  ondansetron (ZOFRAN) injection 4 mg (has no administration in time range)  lisinopril (ZESTRIL) tablet 10 mg (10 mg Oral Given 08/31/22 1030)  amiodarone (PACERONE) tablet 400 mg (400 mg Oral Given 08/31/22 1030)  apixaban (ELIQUIS) tablet 5 mg (5 mg Oral Given 08/31/22 1029)  aspirin EC tablet 81 mg (81 mg Oral Given 08/31/22 1029)  docusate sodium (COLACE) capsule 100 mg (100 mg Oral Given 08/31/22 1029)  labetalol (NORMODYNE) injection 20 mg (20 mg Intravenous Given 08/30/22 2213)  alum & mag hydroxide-simeth (MAALOX/MYLANTA) 200-200-20 MG/5ML  suspension 15 mL (has no administration in time range)  pantoprazole (PROTONIX) EC tablet 40 mg (40 mg Oral Given 08/30/22 1903)  furosemide (LASIX) injection 40 mg (40 mg Intravenous Given 08/31/22 1033)  metoprolol tartrate (LOPRESSOR) tablet 25 mg (has no administration in time range)  iohexol (OMNIPAQUE) 9 MG/ML oral solution (has no administration in time range)  potassium chloride SA (KLOR-CON M) CR tablet 40 mEq (has no administration in time range)  acetaminophen (TYLENOL) tablet 1,000 mg (1,000 mg Oral Given 08/30/22 1330)  metoprolol tartrate (LOPRESSOR)  injection 10 mg (10 mg Intravenous Given 08/30/22 1503)  famotidine (PEPCID) IVPB 20 mg premix (0 mg Intravenous Stopped 08/30/22 1608)  furosemide (LASIX) injection 20 mg (20 mg Intravenous Given 08/30/22 1608)  furosemide (LASIX) injection 20 mg (20 mg Intravenous Given 08/30/22 1825)  HYDROmorphone (DILAUDID) injection 0.5 mg (0.5 mg Intravenous Given 08/31/22 0208)  pantoprazole (PROTONIX) injection 40 mg (40 mg Intravenous Given 08/31/22 0018)  dicyclomine (BENTYL) 10 MG/5ML solution 10 mg (10 mg Oral Given 08/31/22 0020)  alum & mag hydroxide-simeth (MAALOX/MYLANTA) 200-200-20 MG/5ML suspension 30 mL (30 mLs Oral Given 08/31/22 0019)    And  lidocaine (XYLOCAINE) 2 % viscous mouth solution 15 mL (15 mLs Oral Given 08/31/22 0019)  potassium chloride (KLOR-CON) packet 40 mEq (40 mEq Oral Given 08/31/22 1027)    Mobility walks Moderate fall risk   Focused Assessments     R Recommendations: See Admitting Provider Note  Report given to:   Additional Notes:

## 2022-08-31 NOTE — Progress Notes (Signed)
Received to 6E06 via bed from ED. Oriented to room/unit. Call bell in reach. Instructed to call for needs, OOB etc, Verbalized understanding. Cardiac Monitor [phone] verified with Central [employer]. Family into room at bedside.

## 2022-09-01 ENCOUNTER — Inpatient Hospital Stay (HOSPITAL_COMMUNITY): Payer: Medicare Other

## 2022-09-01 DIAGNOSIS — I480 Paroxysmal atrial fibrillation: Secondary | ICD-10-CM | POA: Diagnosis not present

## 2022-09-01 DIAGNOSIS — I10 Essential (primary) hypertension: Secondary | ICD-10-CM | POA: Diagnosis not present

## 2022-09-01 DIAGNOSIS — I5033 Acute on chronic diastolic (congestive) heart failure: Secondary | ICD-10-CM | POA: Diagnosis not present

## 2022-09-01 LAB — COMPREHENSIVE METABOLIC PANEL
ALT: 34 U/L (ref 0–44)
AST: 27 U/L (ref 15–41)
Albumin: 3.4 g/dL — ABNORMAL LOW (ref 3.5–5.0)
Alkaline Phosphatase: 109 U/L (ref 38–126)
Anion gap: 14 (ref 5–15)
BUN: 15 mg/dL (ref 8–23)
CO2: 21 mmol/L — ABNORMAL LOW (ref 22–32)
Calcium: 8.7 mg/dL — ABNORMAL LOW (ref 8.9–10.3)
Chloride: 104 mmol/L (ref 98–111)
Creatinine, Ser: 1.39 mg/dL — ABNORMAL HIGH (ref 0.44–1.00)
GFR, Estimated: 36 mL/min — ABNORMAL LOW (ref >60)
Glucose, Bld: 105 mg/dL — ABNORMAL HIGH (ref 70–99)
Potassium: 4.7 mmol/L (ref 3.5–5.1)
Sodium: 139 mmol/L (ref 135–145)
Total Bilirubin: 1.5 mg/dL — ABNORMAL HIGH (ref 0.3–1.2)
Total Protein: 5.9 g/dL — ABNORMAL LOW (ref 6.5–8.1)

## 2022-09-01 LAB — MAGNESIUM: Magnesium: 1.8 mg/dL (ref 1.7–2.4)

## 2022-09-01 LAB — LIPASE, BLOOD: Lipase: 43 U/L (ref 11–51)

## 2022-09-01 MED ORDER — POTASSIUM CHLORIDE CRYS ER 20 MEQ PO TBCR
40.0000 meq | EXTENDED_RELEASE_TABLET | ORAL | Status: AC
  Administered 2022-09-01: 40 meq via ORAL
  Filled 2022-09-01: qty 2

## 2022-09-01 MED ORDER — HYDRALAZINE HCL 25 MG PO TABS
25.0000 mg | ORAL_TABLET | Freq: Three times a day (TID) | ORAL | Status: DC
  Administered 2022-09-01 – 2022-09-02 (×3): 25 mg via ORAL
  Filled 2022-09-01 (×3): qty 1

## 2022-09-01 MED ORDER — FUROSEMIDE 10 MG/ML IJ SOLN
40.0000 mg | Freq: Every day | INTRAMUSCULAR | Status: DC
  Administered 2022-09-01 – 2022-09-02 (×2): 40 mg via INTRAVENOUS
  Filled 2022-09-01 (×2): qty 4

## 2022-09-01 MED ORDER — FUROSEMIDE 10 MG/ML IJ SOLN
40.0000 mg | Freq: Once | INTRAMUSCULAR | Status: AC
  Administered 2022-09-01: 40 mg via INTRAVENOUS
  Filled 2022-09-01: qty 4

## 2022-09-01 NOTE — Progress Notes (Signed)
Prn xopenex given to pt due to SOB.

## 2022-09-01 NOTE — Progress Notes (Signed)
Patient complains of shortness of breath. Blood pressure elevated with systolics in the 190s. Oxygen saturations 95 on RA. Prn labetalol and breathing treatment given. Margo Aye, MD notified. Lasix and Potassium supplements ordered. Patient states, "I am breathing better" after treatment.

## 2022-09-01 NOTE — Progress Notes (Signed)
Mobility Specialist Progress Note    09/01/22 1024  Mobility  Activity Ambulated with assistance in hallway  Level of Assistance Contact guard assist, steadying assist  Assistive Device Front wheel walker  Distance Ambulated (ft) 200 ft  Activity Response Tolerated well  Mobility Referral Yes  $Mobility charge 1 Mobility   Pre-Mobility: 63 HR, 125/53 (73) BP, 95% SpO2 During Mobility: 81 HR Post-Mobility: 65 HR, 97% SpO2  Pt received in bed and agreeable. Had void in BR. No complaints on walk. Returned to chair with call bell in reach.    Rayle Nation Mobility Specialist  Please Neurosurgeon or Rehab Office at [phone]

## 2022-09-01 NOTE — Progress Notes (Signed)
[facility] PROGRESS NOTE   Sheila Jordan [id number] DOB: [birth year] DOA: 08/30/2022  PCP: Asencion Noble, MD  Brief History/Interval Summary: 88 y.o. female with a past medical history of stroke for which she was recently hospitalized and discharged on 08/29/2022.  She also was diagnosed with atrial fibrillation at that time and was seen by cardiology.  Prior to this recent admission patient did not have any chronic health problems.  She presented with progressively worsening shortness of breath and upper abdominal and lower chest pain.  She was hospitalized due to concern for fluid overload.   Consultants: None  Procedures: None    Subjective/Interval History: Patient mentions that her abdominal discomfort appears to have improved.  Still having occasional shortness of breath.  Denies any chest pain.  No nausea or vomiting.      Assessment/Plan:  Acute diastolic CHF Recent echocardiogram showed normal systolic function but diastolic dysfunction was noted.  No need to repeat echocardiogram.  BNP was noted to be elevated.   Patient started on IV diuretics.  Continue to monitor ins and outs and daily weights.  COVID-19 and influenza PCR's were negative. Troponin levels unremarkable.  EKG did not show any ischemia. Will repeat chest x-ray today.  CT abdomen and pelvis done yesterday which did show evidence for basilar consolidation and pleural effusions bilaterally.  Accelerated hypertension Will have to hold lisinopril due to rising creatinine.  Continue with metoprolol.  Will add hydralazine.  Continue to monitor blood pressures.  Recent embolic strokes status post thrombectomy Seen by neurology during previous admission.  She is on apixaban and aspirin.  LDL was 67. Her left-sided weakness has completely resolved.  Seen by PT.  No follow-up is thought to be necessary.  Paroxysmal atrial fibrillation Required amiodarone infusion during previous hospitalization.   Currently in sinus rhythm.  Continue oral amiodarone.  Continue apixaban. Sinus bradycardia is noted.  No changes to dose of beta-blocker.  GERD/upper abdominal pain Symptoms are suggestive of GERD.  Could also be due to CHF as mentioned above but does not appear to be ischemic type of pain since troponin levels unremarkable.  Continue with PPI for now.   LFTs and lipase unremarkable.  CT abdomen pelvis does not show any acute findings.    Hypokalemia Supplemented.  Normal today.  Magnesium 1.8.  Normocytic anemia No evidence of overt bleeding.  Continue to monitor.  DVT Prophylaxis: On apixaban Code Status: DNR Family Communication: Discussed with patient.  Son is not at bedside today.  Will call him later today. Disposition Plan: Hopefully return home when improved  Status is: Inpatient Remains inpatient appropriate because: Acute diastolic CHF     Medications: Scheduled:  amiodarone  400 mg Oral Daily   apixaban  5 mg Oral BID   aspirin EC  81 mg Oral Daily   docusate sodium  100 mg Oral BID   furosemide  40 mg Intravenous Daily   metoprolol tartrate  25 mg Oral BID   pantoprazole  40 mg Oral Q1200   sodium chloride flush  3 mL Intravenous Q12H   Continuous:  sodium chloride     [id number] chloride, acetaminophen, alum & mag hydroxide-simeth, labetalol, levalbuterol, ondansetron (ZOFRAN) IV, sodium chloride flush  Antibiotics: Anti-infectives (From admission, onward)    None       Objective:  Vital Signs  Vitals:   09/01/22 0210 09/01/22 0225 09/01/22 0452 09/01/22 0902  BP: (!) 193/86 (!) 161/66 (!) 161/59 (!) 170/67  Pulse: 68 61 66 (!)  59  Resp:   16 16  Temp:   98.5 F (36.9 C) 98.3 F (36.8 C)  TempSrc:   Oral Oral  SpO2: 96% 95% 97% 97%  Weight:   76.3 kg   Height:        Intake/Output Summary (Last 24 hours) at 09/01/2022 0939 Last data filed at 09/01/2022 0716 Gross per 24 hour  Intake --  Output 1000 ml  Net -1000 ml    Filed  Weights   08/30/22 1012 09/01/22 0452  Weight: 73.5 kg 76.3 kg    General appearance: Awake alert.  In no distress Resp: Normal effort at rest.  Diminished air entry at the bases with crackles bilaterally. Cardio: S1-S2 is normal regular.  No S3-S4.  No rubs murmurs or bruit GI: Abdomen is soft.  Nontender nondistended.  Bowel sounds are present normal.  No masses organomegaly Extremities: No edema.  Full range of motion of lower extremities.   Lab Results:  Data Reviewed: I have personally reviewed following labs and reports of the imaging studies  CBC: Recent Labs  Lab 08/26/22 0609 08/26/22 0614 08/26/22 1813 08/27/22 0343 08/30/22 1343  WBC 6.9  --   --  7.3 7.5  NEUTROABS 5.2  --   --  5.6 5.7  HGB 12.4 13.3 11.2* 11.8* 11.6*  HCT 37.1 39.0 33.0* 35.1* 34.8*  MCV 89.8  --   --  90.5 89.9  PLT 194  --   --  161 201     Basic Metabolic Panel: Recent Labs  Lab 08/26/22 2043 08/27/22 0343 08/30/22 1343 08/31/22 0648 09/01/22 0207  NA 142 140 139 139 139  K 3.8 3.7 4.1 3.2* 4.7  CL 115* 108 110 103 104  CO2 19* 20* 19* 23 21*  GLUCOSE 121* 118* 105* 109* 105*  BUN 12 13 11 12 15   CREATININE 0.94 1.14* 0.97 1.27* 1.39*  CALCIUM 7.2* 8.5* 8.6* 8.5* 8.7*  MG 1.7 2.4  --   --  1.8     GFR: Estimated Creatinine Clearance: 25.8 mL/min (A) (by C-G formula based on SCr of 1.39 mg/dL (H)).  Liver Function Tests: Recent Labs  Lab 08/26/22 2043 08/27/22 0343 08/30/22 1343 08/31/22 2021 09/01/22 0207  AST 38 37 30 19 27   ALT 43 47* 36 28 34  ALKPHOS 98 113 122 95 109  BILITOT 0.5 0.5 1.0 1.0 1.5*  PROT 5.1* 6.0* 5.9* 5.7* 5.9*  ALBUMIN 3.1* 3.6 3.4* 3.1* 3.4*      Coagulation Profile: Recent Labs  Lab 08/26/22 0609  INR 1.1     CBG: Recent Labs  Lab 08/27/22 0328 08/27/22 0811 08/27/22 1118 08/27/22 1539 08/27/22 1958  GLUCAP 98 97 108* 162* 118*      Recent Results (from the past 240 hour(s))  Resp Panel by RT-PCR (Flu A&B, Covid)  Anterior Nasal Swab     Status: None   Collection Time: 08/26/22  8:00 AM   Specimen: Anterior Nasal Swab  Result Value Ref Range Status   SARS Coronavirus 2 by RT PCR NEGATIVE NEGATIVE Final    Comment: (NOTE) SARS-CoV-2 target nucleic acids are NOT DETECTED.  The SARS-CoV-2 RNA is generally detectable in upper respiratory specimens during the acute phase of infection. The lowest concentration of SARS-CoV-2 viral copies this assay can detect is 138 copies/mL. A negative result does not preclude SARS-Cov-2 infection and should not be used as the sole basis for treatment or other patient management decisions. A negative result may occur with  improper specimen collection/handling, submission of specimen other than nasopharyngeal swab, presence of viral mutation(s) within the areas targeted by this assay, and inadequate number of viral copies(<138 copies/mL). A negative result must be combined with clinical observations, patient history, and epidemiological information. The expected result is Negative.  Fact Sheet for Patients:  EntrepreneurPulse.com.au  Fact Sheet for Healthcare Providers:  IncredibleEmployment.be  This test is no t yet approved or cleared by the [place] FDA and  has been authorized for detection and/or diagnosis of SARS-CoV-2 by FDA under an Emergency Use Authorization (EUA). This EUA will remain  in effect (meaning this test can be used) for the duration of the COVID-19 declaration under Section 564(b)(1) of the Act, 21 U.S.C.section 360bbb-3(b)(1), unless the authorization is terminated  or revoked sooner.       Influenza A by PCR NEGATIVE NEGATIVE Final   Influenza B by PCR NEGATIVE NEGATIVE Final    Comment: (NOTE) The Xpert Xpress SARS-CoV-2/FLU/RSV plus assay is intended as an aid in the diagnosis of influenza from Nasopharyngeal swab specimens and should not be used as a sole basis for treatment. Nasal washings  and aspirates are unacceptable for Xpert Xpress SARS-CoV-2/FLU/RSV testing.  Fact Sheet for Patients: EntrepreneurPulse.com.au  Fact Sheet for Healthcare Providers: IncredibleEmployment.be  This test is not yet approved or cleared by the [place] FDA and has been authorized for detection and/or diagnosis of SARS-CoV-2 by FDA under an Emergency Use Authorization (EUA). This EUA will remain in effect (meaning this test can be used) for the duration of the COVID-19 declaration under Section 564(b)(1) of the Act, 21 U.S.C. section 360bbb-3(b)(1), unless the authorization is terminated or revoked.  Performed at [facility], [address] [place], [place] [address]   MRSA Next Gen by PCR, Nasal     Status: None   Collection Time: 08/26/22 11:45 AM   Specimen: Nasal Mucosa; Nasal Swab  Result Value Ref Range Status   MRSA by PCR Next Gen NOT DETECTED NOT DETECTED Final    Comment: (NOTE) The GeneXpert MRSA Assay (FDA approved for NASAL specimens only), is one component of a comprehensive MRSA colonization surveillance program. It is not intended to diagnose MRSA infection nor to guide or monitor treatment for MRSA infections. Test performance is not FDA approved in patients less than 5 years old. Performed at [facility], [place] [address], [place],  [address]   Resp Panel by RT-PCR (Flu A&B, Covid) Anterior Nasal Swab     Status: None   Collection Time: 08/30/22 11:15 AM   Specimen: Anterior Nasal Swab  Result Value Ref Range Status   SARS Coronavirus 2 by RT PCR NEGATIVE NEGATIVE Final    Comment: (NOTE) SARS-CoV-2 target nucleic acids are NOT DETECTED.  The SARS-CoV-2 RNA is generally detectable in upper respiratory specimens during the acute phase of infection. The lowest concentration of SARS-CoV-2 viral copies this assay can detect is 138 copies/mL. A negative result does not preclude SARS-Cov-2 infection and should  not be used as the sole basis for treatment or other patient management decisions. A negative result may occur with  improper specimen collection/handling, submission of specimen other than nasopharyngeal swab, presence of viral mutation(s) within the areas targeted by this assay, and inadequate number of viral copies(<138 copies/mL). A negative result must be combined with clinical observations, patient history, and epidemiological information. The expected result is Negative.  Fact Sheet for Patients:  EntrepreneurPulse.com.au  Fact Sheet for Healthcare Providers:  IncredibleEmployment.be  This test is  no t yet approved or cleared by the [place] and  has been authorized for detection and/or diagnosis of SARS-CoV-2 by FDA under an Emergency Use Authorization (EUA). This EUA will remain  in effect (meaning this test can be used) for the duration of the COVID-19 declaration under Section 564(b)(1) of the Act, 21 U.S.C.section 360bbb-3(b)(1), unless the authorization is terminated  or revoked sooner.       Influenza A by PCR NEGATIVE NEGATIVE Final   Influenza B by PCR NEGATIVE NEGATIVE Final    Comment: (NOTE) The Xpert Xpress SARS-CoV-2/FLU/RSV plus assay is intended as an aid in the diagnosis of influenza from Nasopharyngeal swab specimens and should not be used as a sole basis for treatment. Nasal washings and aspirates are unacceptable for Xpert Xpress SARS-CoV-2/FLU/RSV testing.  Fact Sheet for Patients: EntrepreneurPulse.com.au  Fact Sheet for Healthcare Providers: IncredibleEmployment.be  This test is not yet approved or cleared by the [place] FDA and has been authorized for detection and/or diagnosis of SARS-CoV-2 by FDA under an Emergency Use Authorization (EUA). This EUA will remain in effect (meaning this test can be used) for the duration of the COVID-19 declaration under  Section 564(b)(1) of the Act, 21 U.S.C. section 360bbb-3(b)(1), unless the authorization is terminated or revoked.  Performed at [facility], [place] [address] [place], [place] [address]       Radiology Studies: DG Chest [place] 1 View  Result Date: 08/31/2022 CLINICAL DATA:  88 year old female with recent pulmonary edema. EXAM: PORTABLE CHEST 1 VIEW COMPARISON:  Chest radiographs 08/30/2022 and earlier. FINDINGS: Portable AP upright view at 0746 hours. Stable lung volumes. Stable cardiac size and mediastinal contours. Pulmonary vascularity appears regressed since yesterday. Small bilateral pleural effusions are less apparent. Trace pleural fluid now in the right minor fissure. No pneumothorax or consolidation. Calcified aortic atherosclerosis. Stable visualized osseous structures. Right shoulder arthroplasty. Negative visible bowel gas pattern. IMPRESSION: Evidence of regressed interstitial edema and small pleural effusions since yesterday. No new cardiopulmonary abnormality. Aortic Atherosclerosis (ICD10-I70.0). Electronically Signed   By: Genevie Ann M.D.   On: 08/31/2022 07:59   DG Chest 2 View  Result Date: 08/30/2022 CLINICAL DATA:  Chest pain and shortness of breath. EXAM: CHEST - 2 VIEW COMPARISON:  Chest radiograph 08/26/2022 FINDINGS: The endotracheal tube has been removed. The patient is mildly rotated to the right. The cardiac silhouette is mildly enlarged. Aortic atherosclerosis is noted. There is increased central pulmonary vascular congestion with mild prominence of the interstitial markings. Increased airspace opacities are present in the left greater than right lung bases, and there are small bilateral pleural effusions. No pneumothorax is identified. A right shoulder arthroplasty and chronic thoracic compression fractures are noted. IMPRESSION: 1. Pulmonary vascular congestion with increased bilateral interstitial and basilar airspace opacities which may reflect edema and  atelectasis. 2. Small bilateral pleural effusions. Electronically Signed   By: Logan Bores M.D.   On: 08/30/2022 10:41       LOS: 1 day   [facility] Pager on [email or web address]  09/01/2022, 9:39 AM

## 2022-09-02 ENCOUNTER — Other Ambulatory Visit (HOSPITAL_COMMUNITY): Payer: Self-pay

## 2022-09-02 LAB — BASIC METABOLIC PANEL
Anion gap: 10 (ref 5–15)
BUN: 17 mg/dL (ref 8–23)
CO2: 27 mmol/L (ref 22–32)
Calcium: 8.7 mg/dL — ABNORMAL LOW (ref 8.9–10.3)
Chloride: 104 mmol/L (ref 98–111)
Creatinine, Ser: 1.41 mg/dL — ABNORMAL HIGH (ref 0.44–1.00)
GFR, Estimated: 35 mL/min — ABNORMAL LOW (ref >60)
Glucose, Bld: 102 mg/dL — ABNORMAL HIGH (ref 70–99)
Potassium: 3.7 mmol/L (ref 3.5–5.1)
Sodium: 141 mmol/L (ref 135–145)

## 2022-09-02 LAB — CBC
HCT: 35 % — ABNORMAL LOW (ref 36.0–46.0)
Hemoglobin: 11.5 g/dL — ABNORMAL LOW (ref 12.0–15.0)
MCH: 29.3 pg (ref 26.0–34.0)
MCHC: 32.9 g/dL (ref 30.0–36.0)
MCV: 89.1 fL (ref 80.0–100.0)
Platelets: 225 10*3/uL (ref 150–400)
RBC: 3.93 MIL/uL (ref 3.87–5.11)
RDW: 13.9 % (ref 11.5–15.5)
WBC: 6 10*3/uL (ref 4.0–10.5)
nRBC: 0 % (ref 0.0–0.2)

## 2022-09-02 MED ORDER — PANTOPRAZOLE SODIUM 40 MG PO TBEC
40.0000 mg | DELAYED_RELEASE_TABLET | Freq: Every day | ORAL | 1 refills | Status: DC
  Filled 2022-09-02 – 2022-09-26 (×2): qty 30, 30d supply, fill #0

## 2022-09-02 MED ORDER — POTASSIUM CHLORIDE CRYS ER 20 MEQ PO TBCR
20.0000 meq | EXTENDED_RELEASE_TABLET | Freq: Every day | ORAL | 1 refills | Status: DC
  Filled 2022-09-02 – 2022-09-26 (×2): qty 30, 30d supply, fill #0

## 2022-09-02 MED ORDER — FUROSEMIDE 20 MG PO TABS
40.0000 mg | ORAL_TABLET | Freq: Every day | ORAL | 3 refills | Status: DC
  Filled 2022-09-02 – 2022-09-26 (×2): qty 60, 30d supply, fill #0
  Filled 2022-10-24: qty 60, 30d supply, fill #1
  Filled 2022-11-24: qty 60, 30d supply, fill #2

## 2022-09-02 MED ORDER — HYDRALAZINE HCL 25 MG PO TABS
25.0000 mg | ORAL_TABLET | Freq: Three times a day (TID) | ORAL | 1 refills | Status: DC
  Filled 2022-09-02 – 2022-09-26 (×2): qty 90, 30d supply, fill #0

## 2022-09-02 NOTE — Progress Notes (Signed)
Heart Failure Navigator Progress Note  Assessed for Heart & Vascular TOC clinic readiness.  Patient EF 50-55%, has a Cardiology hospital follow up on 09/09/2022. .   Navigator will sign off at this time.    Rhae Hammock, BSN, Scientist, clinical (histocompatibility and immunogenetics) Only

## 2022-09-02 NOTE — Discharge Summary (Signed)
[facility]  Physician Discharge Summary   Patient ID: Sheila Jordan MRN: [id number] DOB/AGE: 86/20/1929 86 y.o.  Admit date: 08/30/2022 Discharge date: 09/02/2022    PCP: Sheila Noble, MD  DISCHARGE DIAGNOSES:    Acute on chronic diastolic CHF (congestive heart failure) (HCC)   HTN (hypertension)   PAF (paroxysmal atrial fibrillation) ([place])   History of stroke   RECOMMENDATIONS FOR OUTPATIENT FOLLOW UP: Patient instructed to follow-up with her PCP in the next few days to have her renal function checked. Message sent to cardiology to see if she can be seen in outpatient setting in an expedited fashion    Home Health: PT and OT Equipment/Devices: None  CODE STATUS: DNR  DISCHARGE CONDITION: fair  Diet recommendation: As before  INITIAL HISTORY: 88 y.o. female with a past medical history of stroke for which she was recently hospitalized and discharged on 08/29/2022.  She also was diagnosed with atrial fibrillation at that time and was seen by cardiology.  Prior to this recent admission patient did not have any chronic health problems.  She presented with progressively worsening shortness of breath and upper abdominal and lower chest pain.  She was hospitalized due to concern for fluid overload.     HOSPITAL COURSE:   Acute diastolic CHF Recent echocardiogram showed normal systolic function but diastolic dysfunction was noted.  No need to repeat echocardiogram.  BNP was noted to be elevated.   COVID-19 and influenza PCR's were negative. Troponin levels unremarkable.  EKG did not show any ischemia. Patient was given IV diuretics.  Chest x-ray was repeated.  Feels much better this morning.  Saturating normal on room air.  Lower extremity edema has significantly improved.  Will be discharged on daily dose of furosemide.  Due to elevated creatinine we will hold off on ACE inhibitor or ARB. Patient is already on beta-blocker. Message sent to cardiology to arrange  outpatient follow-up. Her serum creatinine was noted to be elevated.  Likely due to effective diuresis.  Will recommend that she follow-up with PCP to have blood work done by the end of the week to make sure renal function is stable.   Accelerated hypertension Continue with metoprolol and hydralazine.   Recent embolic strokes status post thrombectomy Seen by neurology during previous admission.  She is on apixaban and aspirin.  LDL was 67. Her left-sided weakness has completely resolved.  She was seen by physical therapy.  Ambulated without difficulty.   Paroxysmal atrial fibrillation Required amiodarone infusion during previous hospitalization.  Currently in sinus rhythm.  Continue oral amiodarone.  Continue apixaban. Sinus bradycardia is noted.  No changes to dose of beta-blocker.   GERD/upper abdominal pain Symptoms are suggestive of GERD.  Could also be due to CHF as mentioned above but does not appear to be ischemic type of pain since troponin levels unremarkable.  Continue with PPI for now.   LFTs and lipase unremarkable.  CT abdomen pelvis does not show any acute findings.     Hypokalemia Supplemented.  Will be discharged on daily potassium supplement.   Normocytic anemia No evidence of overt bleeding.    Patient feels better.  All of the above discussed with patient and her son.  Okay for discharge today.   PERTINENT LABS:  The results of significant diagnostics from this hospitalization (including imaging, microbiology, ancillary and laboratory) are listed below for reference.    Microbiology: Recent Results (from the past 240 hour(s))  Resp Panel by RT-PCR (Flu A&B, Covid) Anterior Nasal Swab  Status: None   Collection Time: 08/26/22  8:00 AM   Specimen: Anterior Nasal Swab  Result Value Ref Range Status   SARS Coronavirus 2 by RT PCR NEGATIVE NEGATIVE Final    Comment: (NOTE) SARS-CoV-2 target nucleic acids are NOT DETECTED.  The SARS-CoV-2 RNA is generally  detectable in upper respiratory specimens during the acute phase of infection. The lowest concentration of SARS-CoV-2 viral copies this assay can detect is 138 copies/mL. A negative result does not preclude SARS-Cov-2 infection and should not be used as the sole basis for treatment or other patient management decisions. A negative result may occur with  improper specimen collection/handling, submission of specimen other than nasopharyngeal swab, presence of viral mutation(s) within the areas targeted by this assay, and inadequate number of viral copies(<138 copies/mL). A negative result must be combined with clinical observations, patient history, and epidemiological information. The expected result is Negative.  Fact Sheet for Patients:  EntrepreneurPulse.com.au  Fact Sheet for Healthcare Providers:  IncredibleEmployment.be  This test is no t yet approved or cleared by the [place] FDA and  has been authorized for detection and/or diagnosis of SARS-CoV-2 by FDA under an Emergency Use Authorization (EUA). This EUA will remain  in effect (meaning this test can be used) for the duration of the COVID-19 declaration under Section 564(b)(1) of the Act, 21 U.S.C.section 360bbb-3(b)(1), unless the authorization is terminated  or revoked sooner.       Influenza A by PCR NEGATIVE NEGATIVE Final   Influenza B by PCR NEGATIVE NEGATIVE Final    Comment: (NOTE) The Xpert Xpress SARS-CoV-2/FLU/RSV plus assay is intended as an aid in the diagnosis of influenza from Nasopharyngeal swab specimens and should not be used as a sole basis for treatment. Nasal washings and aspirates are unacceptable for Xpert Xpress SARS-CoV-2/FLU/RSV testing.  Fact Sheet for Patients: EntrepreneurPulse.com.au  Fact Sheet for Healthcare Providers: IncredibleEmployment.be  This test is not yet approved or cleared by the [place] FDA  and has been authorized for detection and/or diagnosis of SARS-CoV-2 by FDA under an Emergency Use Authorization (EUA). This EUA will remain in effect (meaning this test can be used) for the duration of the COVID-19 declaration under Section 564(b)(1) of the Act, 21 U.S.C. section 360bbb-3(b)(1), unless the authorization is terminated or revoked.  Performed at [facility], [address] [place], [place] [address]   MRSA Next Gen by PCR, Nasal     Status: None   Collection Time: 08/26/22 11:45 AM   Specimen: Nasal Mucosa; Nasal Swab  Result Value Ref Range Status   MRSA by PCR Next Gen NOT DETECTED NOT DETECTED Final    Comment: (NOTE) The GeneXpert MRSA Assay (FDA approved for NASAL specimens only), is one component of a comprehensive MRSA colonization surveillance program. It is not intended to diagnose MRSA infection nor to guide or monitor treatment for MRSA infections. Test performance is not FDA approved in patients less than 31 years old. Performed at [facility], [place] [address], [place],  [address]   Resp Panel by RT-PCR (Flu A&B, Covid) Anterior Nasal Swab     Status: None   Collection Time: 08/30/22 11:15 AM   Specimen: Anterior Nasal Swab  Result Value Ref Range Status   SARS Coronavirus 2 by RT PCR NEGATIVE NEGATIVE Final    Comment: (NOTE) SARS-CoV-2 target nucleic acids are NOT DETECTED.  The SARS-CoV-2 RNA is generally detectable in upper respiratory specimens during the acute phase of infection. The lowest concentration of  SARS-CoV-2 viral copies this assay can detect is 138 copies/mL. A negative result does not preclude SARS-Cov-2 infection and should not be used as the sole basis for treatment or other patient management decisions. A negative result may occur with  improper specimen collection/handling, submission of specimen other than nasopharyngeal swab, presence of viral mutation(s) within the areas targeted by this assay, and inadequate  number of viral copies(<138 copies/mL). A negative result must be combined with clinical observations, patient history, and epidemiological information. The expected result is Negative.  Fact Sheet for Patients:  EntrepreneurPulse.com.au  Fact Sheet for Healthcare Providers:  IncredibleEmployment.be  This test is no t yet approved or cleared by the [place] FDA and  has been authorized for detection and/or diagnosis of SARS-CoV-2 by FDA under an Emergency Use Authorization (EUA). This EUA will remain  in effect (meaning this test can be used) for the duration of the COVID-19 declaration under Section 564(b)(1) of the Act, 21 U.S.C.section 360bbb-3(b)(1), unless the authorization is terminated  or revoked sooner.       Influenza A by PCR NEGATIVE NEGATIVE Final   Influenza B by PCR NEGATIVE NEGATIVE Final    Comment: (NOTE) The Xpert Xpress SARS-CoV-2/FLU/RSV plus assay is intended as an aid in the diagnosis of influenza from Nasopharyngeal swab specimens and should not be used as a sole basis for treatment. Nasal washings and aspirates are unacceptable for Xpert Xpress SARS-CoV-2/FLU/RSV testing.  Fact Sheet for Patients: EntrepreneurPulse.com.au  Fact Sheet for Healthcare Providers: IncredibleEmployment.be  This test is not yet approved or cleared by the [place] FDA and has been authorized for detection and/or diagnosis of SARS-CoV-2 by FDA under an Emergency Use Authorization (EUA). This EUA will remain in effect (meaning this test can be used) for the duration of the COVID-19 declaration under Section 564(b)(1) of the Act, 21 U.S.C. section 360bbb-3(b)(1), unless the authorization is terminated or revoked.  Performed at [facility], [place] [address], [place], [place] [address]      Labs:   Basic Metabolic Panel: Recent Labs  Lab 08/26/22 2043 08/27/22 0343 08/30/22 1343  08/31/22 0648 09/01/22 0207 09/02/22 0221  NA 142 140 139 139 139 141  K 3.8 3.7 4.1 3.2* 4.7 3.7  CL 115* 108 110 103 104 104  CO2 19* 20* 19* 23 21* 27  GLUCOSE 121* 118* 105* 109* 105* 102*  BUN 12 13 11 12 15 17   CREATININE 0.94 1.14* 0.97 1.27* 1.39* 1.41*  CALCIUM 7.2* 8.5* 8.6* 8.5* 8.7* 8.7*  MG 1.7 2.4  --   --  1.8  --    Liver Function Tests: Recent Labs  Lab 08/26/22 2043 08/27/22 0343 08/30/22 1343 08/31/22 2021 09/01/22 0207  AST 38 37 30 19 27   ALT 43 47* 36 28 34  ALKPHOS 98 113 122 95 109  BILITOT 0.5 0.5 1.0 1.0 1.5*  PROT 5.1* 6.0* 5.9* 5.7* 5.9*  ALBUMIN 3.1* 3.6 3.4* 3.1* 3.4*   Recent Labs  Lab 08/31/22 2021 09/01/22 0207  LIPASE 50 43    CBC: Recent Labs  Lab 08/26/22 1813 08/27/22 0343 08/30/22 1343 09/02/22 0221  WBC  --  7.3 7.5 6.0  NEUTROABS  --  5.6 5.7  --   HGB 11.2* 11.8* 11.6* 11.5*  HCT 33.0* 35.1* 34.8* 35.0*  MCV  --  90.5 89.9 89.1  PLT  --  161 201 225    BNP: BNP (last 3 results) Recent Labs    08/30/22 1343 08/31/22 [id number]  BNP 980.7* 879.0*    CBG: Recent Labs  Lab 08/27/22 0328 08/27/22 0811 08/27/22 1118 08/27/22 1539 08/27/22 1958  GLUCAP 98 97 108* 162* 118*     IMAGING STUDIES DG CHEST PORT 1 VIEW  Result Date: 09/01/2022 CLINICAL DATA:  Shortness of breath and dyspnea. EXAM: PORTABLE CHEST 1 VIEW COMPARISON:  08/31/2022 FINDINGS: 0946 hours. The cardio pericardial silhouette is enlarged. Similar basilar atelectasis with tiny bilateral pleural effusions. Interstitial markings are diffusely coarsened with chronic features. No pulmonary edema. Bones are diffusely demineralized. IMPRESSION: Similar basilar atelectasis with tiny bilateral pleural effusions. Electronically Signed   By: Kennith Center M.D.   On: 09/01/2022 10:41   CT ABDOMEN PELVIS WO CONTRAST  Result Date: 08/31/2022 CLINICAL DATA:  Abdominal pain EXAM: CT ABDOMEN AND PELVIS WITHOUT CONTRAST TECHNIQUE: Multidetector CT imaging of  the abdomen and pelvis was performed following the standard protocol with oral contrast. RADIATION DOSE REDUCTION: This exam was performed according to the departmental dose-optimization program which includes automated exposure control, adjustment of the mA and/or kV according to patient size and/or use of iterative reconstruction technique. COMPARISON:  None Available. FINDINGS: Lower chest: Moderate bilateral pleural effusions. Bibasilar consolidation or volume loss above the effusions. Calcified mitral valve. Hepatobiliary: Suboptimal post cholecystectomy. No focal hepatic parenchymal abnormalities. Pancreas: Unremarkable. No pancreatic ductal dilatation or surrounding inflammatory changes. Spleen: Normal in size without focal abnormality. Adrenals/Urinary Tract: Adrenal glands are unremarkable. Kidneys are normal, without renal calculi, focal lesion, or hydronephrosis. Bladder is moderately distended. Stomach/Bowel: No evidence of bowel obstruction. Oral contrast progressed 2 descending colon. Appendix is not discretely visualized and there is no evidence of appendicitis. Vascular/Lymphatic: Dense atheromatous calcifications noted of the abdominal aorta and its branches. Reproductive: Uterus and bilateral adnexa are unremarkable. Other: No abdominal wall hernia or abnormality. No abdominopelvic ascites. Musculoskeletal: No acute or significant osseous findings. IMPRESSION: Bibasilar consolidation and pleural effusions. No abdominopelvic pathology identified. Electronically Signed   By: Layla Maw M.D.   On: 08/31/2022 14:09   DG Chest Port 1 View  Result Date: 08/31/2022 CLINICAL DATA:  88 year old female with recent pulmonary edema. EXAM: PORTABLE CHEST 1 VIEW COMPARISON:  Chest radiographs 08/30/2022 and earlier. FINDINGS: Portable AP upright view at 0746 hours. Stable lung volumes. Stable cardiac size and mediastinal contours. Pulmonary vascularity appears regressed since yesterday. Small  bilateral pleural effusions are less apparent. Trace pleural fluid now in the right minor fissure. No pneumothorax or consolidation. Calcified aortic atherosclerosis. Stable visualized osseous structures. Right shoulder arthroplasty. Negative visible bowel gas pattern. IMPRESSION: Evidence of regressed interstitial edema and small pleural effusions since yesterday. No new cardiopulmonary abnormality. Aortic Atherosclerosis (ICD10-I70.0). Electronically Signed   By: Odessa Fleming M.D.   On: 08/31/2022 07:59   DG Chest 2 View  Result Date: 08/30/2022 CLINICAL DATA:  Chest pain and shortness of breath. EXAM: CHEST - 2 VIEW COMPARISON:  Chest radiograph 08/26/2022 FINDINGS: The endotracheal tube has been removed. The patient is mildly rotated to the right. The cardiac silhouette is mildly enlarged. Aortic atherosclerosis is noted. There is increased central pulmonary vascular congestion with mild prominence of the interstitial markings. Increased airspace opacities are present in the left greater than right lung bases, and there are small bilateral pleural effusions. No pneumothorax is identified. A right shoulder arthroplasty and chronic thoracic compression fractures are noted. IMPRESSION: 1. Pulmonary vascular congestion with increased bilateral interstitial and basilar airspace opacities which may reflect edema and atelectasis. 2. Small bilateral pleural effusions. Electronically Signed   By: Freida Busman  Mosetta Putt M.D.   On: 08/30/2022 10:41    ECHOCARDIOGRAM COMPLETE  Result Date: 08/26/2022    ECHOCARDIOGRAM REPORT   Patient Name:   Sheila Jordan Date of Exam: 08/26/2022 Medical Rec #:  [id number]        Height:       64.0 in Accession #:    [id number]       Weight:       167.5 lb Date of Birth:  30-Oct-1927        BSA:          1.815 m Patient Age:    89 years         BP:           164/77 mmHg Patient Gender: F                HR:           80 bpm. Exam Location:  Inpatient Procedure: 2D Echo, Cardiac Doppler and  Color Doppler Indications:    Stroke  History:        Patient has no prior history of Echocardiogram examinations.                 Risk Factors:Hypertension.  Sonographer:    Milda Smart Referring Phys: Tara.[email or web address] MCNEILL P KIRKPATRICK  Sonographer Comments: Technically difficult study due to poor echo windows and echo performed with patient supine and on artificial respirator. Image acquisition challenging due to patient body habitus and Image acquisition challenging due to respiratory  motion. IMPRESSIONS  1. Left ventricular ejection fraction, by estimation, is 50 to 55%. The left ventricle has low normal function. The left ventricle has no regional wall motion abnormalities. Left ventricular diastolic parameters are consistent with Grade II diastolic dysfunction (pseudonormalization). Elevated left ventricular end-diastolic pressure.  2. Right ventricular systolic function is normal. The right ventricular size is normal. There is mildly elevated pulmonary artery systolic pressure.  3. Left atrial size was severely dilated.  4. The mitral valve is normal in structure. Mild mitral valve regurgitation. No evidence of mitral stenosis.  5. The aortic valve is tricuspid. Aortic valve regurgitation is not visualized. No aortic stenosis is present.  6. Pulmonic valve regurgitation is moderate.  7. The inferior vena cava is normal in size with greater than 50% respiratory variability, suggesting right atrial pressure of 3 mmHg. FINDINGS  Left Ventricle: Left ventricular ejection fraction, by estimation, is 50 to 55%. The left ventricle has low normal function. The left ventricle has no regional wall motion abnormalities. The left ventricular internal cavity size was normal in size. There is no left ventricular hypertrophy. Left ventricular diastolic parameters are consistent with Grade II diastolic dysfunction (pseudonormalization). Elevated left ventricular end-diastolic pressure. Right Ventricle: The right ventricular  size is normal. No increase in right ventricular wall thickness. Right ventricular systolic function is normal. There is mildly elevated pulmonary artery systolic pressure. The tricuspid regurgitant velocity is 2.57  m/s, and with an assumed right atrial pressure of 15 mmHg, the estimated right ventricular systolic pressure is 41.4 mmHg. Left Atrium: Left atrial size was severely dilated. Right Atrium: Right atrial size was normal in size. Pericardium: There is no evidence of pericardial effusion. Mitral Valve: The mitral valve is normal in structure. Mild mitral annular calcification. Mild mitral valve regurgitation. No evidence of mitral valve stenosis. Tricuspid Valve: The tricuspid valve is normal in structure. Tricuspid valve regurgitation is mild . No evidence of tricuspid stenosis. Aortic Valve: The aortic valve is tricuspid.  Aortic valve regurgitation is not visualized. No aortic stenosis is present. Pulmonic Valve: The pulmonic valve was normal in structure. Pulmonic valve regurgitation is moderate. No evidence of pulmonic stenosis. Aorta: The aortic root is normal in size and structure. Venous: The inferior vena cava is normal in size with greater than 50% respiratory variability, suggesting right atrial pressure of 3 mmHg. IAS/Shunts: No atrial level shunt detected by color flow Doppler.  LEFT VENTRICLE PLAX 2D LVIDd:         3.90 cm   Diastology LVIDs:         2.90 cm   LV e' medial:    7.29 cm/s LV PW:         0.80 cm   LV E/e' medial:  14.5 LV IVS:        0.70 cm   LV e' lateral:   5.77 cm/s LVOT diam:     1.90 cm   LV E/e' lateral: 18.4 LV SV:         57 LV SV Index:   32 LVOT Area:     2.84 cm  RIGHT VENTRICLE RV S prime:     13.90 cm/s TAPSE (M-mode): 1.4 cm LEFT ATRIUM             Index        RIGHT ATRIUM           Index LA diam:        3.30 cm 1.82 cm/m   RA Area:     11.80 cm LA Vol (A2C):   92.6 ml 51.03 ml/m  RA Volume:   22.80 ml  12.56 ml/m LA Vol (A4C):   83.6 ml 46.07 ml/m LA Biplane  Vol: 91.3 ml 50.31 ml/m  AORTIC VALVE             PULMONIC VALVE LVOT Vmax:   85.70 cm/s  PR End Diast Vel: 4.67 msec LVOT Vmean:  64.000 cm/s LVOT VTI:    0.202 m  AORTA Ao Root diam: 2.80 cm Ao Asc diam:  3.20 cm MITRAL VALVE                  TRICUSPID VALVE MV Area (PHT): 3.58 cm       TR Peak grad:   26.4 mmHg MV Decel Time: 212 msec       TR Mean grad:   18.0 mmHg MR Peak grad:    67.6 mmHg    TR Vmax:        257.00 cm/s MR Vmax:         411.00 cm/s  TR Vmean:       204.0 cm/s MR PISA:         0.57 cm MR PISA Eff ROA: 4 mm        SHUNTS MR PISA Radius:  0.30 cm      Systemic VTI:  0.20 m MV E velocity: 106.00 cm/s    Systemic Diam: 1.90 cm MV A velocity: 91.40 cm/s MV E/A ratio:  1.16 Skeet Latch MD Electronically signed by Skeet Latch MD Signature Date/Time: 08/26/2022/4:03:57 PM    Final     DISCHARGE EXAMINATION: Vitals:   09/01/22 2000 09/01/22 2109 09/02/22 0505 09/02/22 0951  BP:  (!) 121/57 (!) 139/54 122/65  Pulse: (!) 58 (!) 58 (!) 56 60  Resp:  16 16   Temp:  98.8 F (37.1 C) 97.8 F (36.6 C) 98.3 F (36.8 C)  TempSrc:  Oral Oral Oral  SpO2: 93% 95% 94% 98%  Weight:   77.5 kg   Height:       General appearance: Awake alert.  In no distress Resp: Improved air entry with fewer crackles compared to yesterday.  Normal effort at rest. Cardio: S1-S2 is normal regular.  No S3-S4.  No rubs murmurs or bruit GI: Abdomen is soft.  Nontender nondistended.  Bowel sounds are present normal.  No masses organomegaly Extremities: Improved edema bilateral lower extremities   DISPOSITION: Home with the son  Discharge Instructions     (HEART FAILURE PATIENTS) Call MD:  Anytime you have any of the following symptoms: 1) 3 pound weight gain in 24 hours or 5 pounds in 1 week 2) shortness of breath, with or without a dry hacking cough 3) swelling in the hands, feet or stomach 4) if you have to sleep on extra pillows at night in order to breathe.   Complete by: As directed    Call  MD for:  difficulty breathing, headache or visual disturbances   Complete by: As directed    Call MD for:  extreme fatigue   Complete by: As directed    Call MD for:  persistant dizziness or light-headedness   Complete by: As directed    Call MD for:  persistant nausea and vomiting   Complete by: As directed    Call MD for:  severe uncontrolled pain   Complete by: As directed    Call MD for:  temperature >100.4   Complete by: As directed    Diet - low sodium heart healthy   Complete by: As directed    Discharge instructions   Complete by: As directed    Please take your medications as prescribed.  Please seek attention if your symptoms recur.  Please be sure to follow-up with Dr. Ria Comment office by Thursday or Friday to have blood work done to check your kidney function.   Heart Failure patients record your daily weight using the same scale at the same time of day   Complete by: As directed    Increase activity slowly   Complete by: As directed    STOP any activity that causes chest pain, shortness of breath, dizziness, sweating, or exessive weakness   Complete by: As directed           Allergies as of 09/02/2022   No Known Allergies      Medication List     STOP taking these medications    ferrous sulfate 325 (65 FE) MG EC tablet   Mylanta Maximum Strength 400-400-40 MG/5ML suspension Generic drug: alum & mag hydroxide-simeth       TAKE these medications    amiodarone 400 MG tablet Commonly known as: PACERONE Take 1 tablet (400 mg total) by mouth daily.   aspirin EC 81 MG tablet Take 1 tablet (81 mg total) by mouth daily. Swallow whole.   docusate sodium 100 MG capsule Commonly known as: COLACE Take 100 mg by mouth daily as needed for mild constipation.   Eliquis 5 MG Tabs tablet Generic drug: apixaban Take 1 tablet (5 mg total) by mouth 2 (two) times daily.   furosemide 20 MG tablet Commonly known as: Lasix Take 2 tablets (40 mg total) by mouth  daily.   hydrALAZINE 25 MG tablet Commonly known as: APRESOLINE Take 1 tablet (25 mg total) by mouth every 8 (eight) hours.   metoprolol tartrate 25 MG tablet Commonly known as: LOPRESSOR Take 1 tablet (25 mg total) by  mouth 2 (two) times daily.   pantoprazole 40 MG tablet Commonly known as: PROTONIX Take 1 tablet (40 mg total) by mouth daily at 12 noon.   potassium chloride SA 20 MEQ tablet Commonly known as: KLOR-CON M Take 1 tablet (20 mEq total) by mouth daily.          Follow-up Information     Sheila Noble, MD Follow up in 4 day(s).   Specialty: Internal Medicine Why: post hospitalization follow up and for blood work to check kidney function Contact information: [address] [place] [address] [phone]         Jettie Booze, MD Follow up.   Specialties: Cardiology, Radiology, Interventional Cardiology Why: office will call to schedule appointment Contact information: [id number] [address] [phone]                 TOTAL DISCHARGE TIME: 35 minutes  [place]  [facility] Pager on [email or web address]  09/02/2022, 12:15 PM

## 2022-09-02 NOTE — Care Management (Signed)
1235 09-02-22 Late Entry: Patient is currently active with [facility]. Case Manager did call [employer] and she is aware of transition home- [facility] RN has been added to orders for CHF Management along with PT/OT. No further needs identified at this time.

## 2022-09-06 DIAGNOSIS — I480 Paroxysmal atrial fibrillation: Secondary | ICD-10-CM | POA: Diagnosis not present

## 2022-09-06 DIAGNOSIS — Z823 Family history of stroke: Secondary | ICD-10-CM | POA: Diagnosis not present

## 2022-09-06 DIAGNOSIS — I5031 Acute diastolic (congestive) heart failure: Secondary | ICD-10-CM | POA: Diagnosis not present

## 2022-09-06 DIAGNOSIS — I10 Essential (primary) hypertension: Secondary | ICD-10-CM | POA: Diagnosis not present

## 2022-09-07 DIAGNOSIS — N183 Chronic kidney disease, stage 3 unspecified: Secondary | ICD-10-CM | POA: Diagnosis not present

## 2022-09-07 DIAGNOSIS — I69342 Monoplegia of lower limb following cerebral infarction affecting left dominant side: Secondary | ICD-10-CM | POA: Diagnosis not present

## 2022-09-07 DIAGNOSIS — I69341 Monoplegia of lower limb following cerebral infarction affecting right dominant side: Secondary | ICD-10-CM | POA: Diagnosis not present

## 2022-09-07 DIAGNOSIS — E785 Hyperlipidemia, unspecified: Secondary | ICD-10-CM | POA: Diagnosis not present

## 2022-09-07 DIAGNOSIS — I4891 Unspecified atrial fibrillation: Secondary | ICD-10-CM | POA: Diagnosis not present

## 2022-09-07 DIAGNOSIS — Z7901 Long term (current) use of anticoagulants: Secondary | ICD-10-CM | POA: Diagnosis not present

## 2022-09-07 DIAGNOSIS — I129 Hypertensive chronic kidney disease with stage 1 through stage 4 chronic kidney disease, or unspecified chronic kidney disease: Secondary | ICD-10-CM | POA: Diagnosis not present

## 2022-09-07 DIAGNOSIS — I69398 Other sequelae of cerebral infarction: Secondary | ICD-10-CM | POA: Diagnosis not present

## 2022-09-07 DIAGNOSIS — R2681 Unsteadiness on feet: Secondary | ICD-10-CM | POA: Diagnosis not present

## 2022-09-07 NOTE — Progress Notes (Unsigned)
Cardiology Office Note:    Date:  09/09/2022   ID:  Sheila Jordan, DOB 1927/12/11, MRN [id number]  PCP:  Carylon Perches, MD   [facility] Providers Cardiologist:  None     Referring MD: Carylon Perches, MD   Chief Complaint: hospital follow-up  History of Present Illness:    Sheila Jordan is a very pleasant 88 y.o. female with a hx of hypertension, arthritis, new onset atrial fibrillation with RVR setting of acute CVA.  She presented to [facility] on 08/26/2022 after awakening around 4 AM that day with difficulty moving her left side and shortness of breath.  Last known normal state was when she went to sleep around 10 PM.  CT demonstrated M2 occlusion and she was emergently transferred to [facility] and underwent successful thrombectomy.  Postoperatively she was noted to be in A-fib with RVR.  She converted to NSR shortly after amiodarone 150 mg bolus and infusion.  She was transition to metoprolol and initiated on Eliquis 5 mg twice daily.  She had full neurological recovery.  2D echo revealed dilated LA, low normal LVEF.  She had return of A-fib RVR and amiodarone was restarted. She was discharged 08/29/2022.  Return to ED on 08/30/2022 with shortness of breath.  Upon arrival, BNP was elevated. CXR with bilateral pleural effusions.  Volume status improved with diuresis with mild bump in creatinine. Antihypertensive therapy was up titrated as she had marked hypertension. She maintained sinus rhythm during admission and was discharged 09/02/22.   Today, she is here for hospital follow-up and is accompanied by her sister. Reports she is doing very well since her discharge. She continues to live alone and care for herself. Symptom that occurred on 11/24 was unable to get her breath. Since coming home, she slept in a lift chair for several nights, but has now returned to her bed. Weighing daily at home, losing about 1 lb daily since d/c. Walks 3 laps around her home, feels she needs to rest  after this. Does not like the taste of water, vomits if she drinks too much. Drinks about 40 oz daily. She denies chest pain, lower extremity edema, palpitations, melena, hematuria, hemoptysis, diaphoresis, weakness, presyncope, syncope, orthopnea, and PND. Cooks for herself and avoids processed and high sodium foods.   Past Medical History:  Diagnosis Date   Arthritis    Hypertension     Past Surgical History:  Procedure Laterality Date   APPENDECTOMY     CHOLECYSTECTOMY     IR CT HEAD LTD  08/26/2022   IR PERCUTANEOUS ART THROMBECTOMY/INFUSION INTRACRANIAL INC DIAG ANGIO  08/26/2022   IR US GUIDE VASC ACCESS RIGHT  08/26/2022   RADIOLOGY WITH ANESTHESIA N/A 08/26/2022   Procedure: RADIOLOGY WITH ANESTHESIA;  Surgeon: Radiologist, Medication, MD;  Location: [facility] OR;  Service: Radiology;  Laterality: N/A;   REVERSE SHOULDER ARTHROPLASTY Right 06/19/2020   Procedure: REVERSE TOTAL SHOULDER ARTHROPLASTY;  Surgeon: Bjorn Pippin, MD;  Location: [facility] OR;  Service: Orthopedics;  Laterality: Right;    Current Medications: Current Meds  Medication Sig   apixaban (ELIQUIS) 5 MG TABS tablet Take 1 tablet (5 mg total) by mouth 2 (two) times daily.   aspirin EC 81 MG tablet Take 1 tablet (81 mg total) by mouth daily. Swallow whole.   docusate sodium (COLACE) 100 MG capsule Take 100 mg by mouth daily as needed for mild constipation.   furosemide (LASIX) 20 MG tablet Take 2 tablets (40 mg total) by mouth  daily. (Patient taking differently: Take 20 mg by mouth daily.)   hydrALAZINE (APRESOLINE) 25 MG tablet Take 1 tablet (25 mg total) by mouth every 8 (eight) hours.   metoprolol tartrate (LOPRESSOR) 25 MG tablet Take 1 tablet (25 mg total) by mouth 2 (two) times daily.   pantoprazole (PROTONIX) 40 MG tablet Take 1 tablet (40 mg total) by mouth daily at 12 noon.   potassium chloride SA (KLOR-CON M) 20 MEQ tablet Take 1 tablet (20 mEq total) by mouth daily.   [DISCONTINUED] amiodarone (PACERONE) 400 MG  tablet Take 1 tablet (400 mg total) by mouth daily.   Current Facility-Administered Medications for the 09/09/22 encounter (Office Visit) with Sheila Life, NP  Medication   clobetasol cream (TEMOVATE) 0.05 %     Allergies:   Patient has no known allergies.   Social History   Socioeconomic History   Marital status: Widowed    Spouse name: Not on file   Number of children: Not on file   Years of education: Not on file   Highest education level: Not on file  Occupational History   Not on file  Tobacco Use   Smoking status: Never   Smokeless tobacco: Never  Vaping Use   Vaping Use: Never used  Substance and Sexual Activity   Alcohol use: No   Drug use: No   Sexual activity: Not Currently    Birth control/protection: Post-menopausal  Other Topics Concern   Not on file  Social History Narrative   Not on file   Social Determinants of Health   Financial Resource Strain: Not on file  Food Insecurity: No Food Insecurity (08/27/2022)   Hunger Vital Sign    Worried About Running Out of Food in the Last Year: Never true    Ran Out of Food in the Last Year: Never true  Transportation Needs: No Transportation Needs (08/27/2022)   PRAPARE - Hydrologist (Medical): No    Lack of Transportation (Non-Medical): No  Physical Activity: Not on file  Stress: Not on file  Social Connections: Not on file     Family History: The patient's family history includes Breast cancer in her daughter; Other in her father; Parkinson's disease in her mother.  ROS:   Please see the history of present illness.  All other systems reviewed and are negative.  Labs/Other Studies Reviewed:    The following studies were reviewed today:  Echo 08/26/22 1. Left ventricular ejection fraction, by estimation, is 50 to 55%. The  left ventricle has low normal function. The left ventricle has no regional  wall motion abnormalities. Left ventricular diastolic parameters are   consistent with Grade II diastolic  dysfunction (pseudonormalization). Elevated left ventricular end-diastolic  pressure.   2. Right ventricular systolic function is normal. The right ventricular  size is normal. There is mildly elevated pulmonary artery systolic  pressure.   3. Left atrial size was severely dilated.   4. The mitral valve is normal in structure. Mild mitral valve  regurgitation. No evidence of mitral stenosis.   5. The aortic valve is tricuspid. Aortic valve regurgitation is not  visualized. No aortic stenosis is present.   6. Pulmonic valve regurgitation is moderate.   7. The inferior vena cava is normal in size with greater than 50%  respiratory variability, suggesting right atrial pressure of 3 mmHg.  Recent Labs: 08/31/2022: B Natriuretic Peptide 879.0 09/01/2022: ALT 34; Magnesium 1.8 09/02/2022: BUN 17; Creatinine, Ser 1.41; Hemoglobin 11.5; Platelets  225; Potassium 3.7; Sodium 141  Recent Lipid Panel    Component Value Date/Time   CHOL 139 08/27/2022 0343   TRIG 67 08/27/2022 0343   HDL 59 08/27/2022 0343   CHOLHDL 2.4 08/27/2022 0343   VLDL 13 08/27/2022 0343   LDLCALC 67 08/27/2022 0343     Risk Assessment/Calculations:    CHA2DS2-VASc Score = 6  This indicates a 9.7% annual risk of stroke. The patient's score is based upon: CHF History: 0 HTN History: 1 Diabetes History: 0 Stroke History: 2 Vascular Disease History: 0 Age Score: 2 Gender Score: 1    Physical Exam:    VS:  BP (!) 122/52   Pulse 62   Ht 5\' 4"  (1.626 m)   Wt 161 lb (73 kg)   SpO2 96%   BMI 27.64 kg/m     Wt Readings from Last 3 Encounters:  09/09/22 161 lb (73 kg)  09/02/22 170 lb 13.7 oz (77.5 kg)  08/26/22 167 lb 8.8 oz (76 kg)     GEN:  Well nourished, well developed in no acute distress HEENT: Normal NECK: No JVD; No carotid bruits CARDIAC: RRR, no murmurs, rubs, gallops RESPIRATORY:  Clear to auscultation without rales, wheezing or rhonchi  ABDOMEN:  Soft, non-tender, non-distended MUSCULOSKELETAL:  No edema; No deformity. 2+ pedal pulses, equal bilaterally SKIN: Warm and dry NEUROLOGIC:  Alert and oriented x 3 PSYCHIATRIC:  Normal affect   EKG:  EKG is not ordered today.    Diagnoses:    1. Chronic combined systolic and diastolic CHF (congestive heart failure) ([place])   2. Cerebrovascular accident (CVA) due to embolism of right middle cerebral artery ([place])   3. Atrial fibrillation with RVR ([place])   4. Secondary hypercoagulable state ([place])   5. Hyperlipidemia LDL goal <70   6. Stage 3b chronic kidney disease ([place])   7. Essential hypertension    Assessment and Plan:     Chronic combined CHF: Return admission [id number] acute diastolic CHF. Echo 08/26/22 with low normal LVEF 50 to 55%, G2 DD, normal RV function with mildly elevated PASP. Reports weight is decreasing since hospital d/c. Mild dyspnea on exertion, no edema, orthopnea, PND.  Limits sodium in diet.  Continue fluid restriction, daily weight. Continue Lasix 20 mg daily with permission to take an additional 20 mg with weight gain of > 2 lbs in 24 hours and > 5 lbs in one week.   Persistent atrial fibrillation on chronic anticoagulation: New onset atrial fibrillation with RVR found on admission 08/26/22 s/p CVA.  Severely dilated LA  on echo. Was asymptomatic prior to CVA. Maintaining sinus rhythm on amiodarone. We will decrease amiodarone from 400 mg daily to 200 mg daily. Have asked PCP to fax results of complete metabolic panel planned for later this week. No bleeding concerns.  Eliquis 5 mg twice daily is appropriate dose for age/weight/kidney function. Continue Eliquis, metoprolol, amiodarone.  CVA: Embolic stroke of right MCA s/p thrombectomy 08/26/22.  Left-sided weakness resolved after thrombectomy.  Advised to continue aspirin and apixaban. Continues to use walker but plans to stop using it soon. No upper extremity weakness.   Hyperlipidemia LDL goal < 70: LDL 67 on  08/27/2022. At goal, not on medication.   Hypertension: BP is well-controlled. Continue low sodium heart healthy diet.   CKD: SCr 1.41 on 09/02/2022. Having follow-up lab work this week with home health.      Disposition: Would like to follow-up in [place]/2 months with Dr. Dellia Cloud  Medication Adjustments/Labs and  Tests Ordered: Current medicines are reviewed at length with the patient today.  Concerns regarding medicines are outlined above.  No orders of the defined types were placed in this encounter.  Meds ordered this encounter  Medications   amiodarone (PACERONE) 200 MG tablet    Sig: Take 1 tablet (200 mg total) by mouth daily.    Dispense:  90 tablet    Refill:  3    Patient Instructions  Medication Instructions:   DECREASE Amiodarone one (1) tablet by mouth ( 200 mg) daily.   *If you need a refill on your cardiac medications before your next appointment, please call your pharmacy*   Lab Work:  None ordered.  If you have labs (blood work) drawn today and your tests are completely normal, you will receive your results only by: [place] (if you have MyChart) OR A paper copy in the mail If you have any lab test that is abnormal or we need to change your treatment, we will call you to review the results.   Testing/Procedures:  None ordered.    Follow-Up: At [facility], you and your health needs are our priority.  As part of our continuing mission to provide you with exceptional heart care, we have created designated Provider Care Teams.  These Care Teams include your primary Cardiologist (physician) and Advanced Practice Providers (APPs -  Physician Assistants and Nurse Practitioners) who all work together to provide you with the care you need, when you need it.  We recommend signing up for the patient portal called "MyChart".  Sign up information is provided on this After Visit Summary.  MyChart is used to connect with patients for Virtual  Visits (Telemedicine).  Patients are able to view lab/test results, encounter notes, upcoming appointments, etc.  Non-urgent messages can be sent to your provider as well.   To learn more about what you can do with MyChart, go to [email or web address].    Your next appointment:   2 month(s)  The format for your next appointment:   In Person  Provider:   Claudina Lick, MD    Important Information About Sugar         Signed, Sheila Life, NP  09/09/2022 7:32 PM    [place]

## 2022-09-09 ENCOUNTER — Telehealth: Payer: Self-pay | Admitting: *Deleted

## 2022-09-09 ENCOUNTER — Encounter: Payer: Self-pay | Admitting: Nurse Practitioner

## 2022-09-09 ENCOUNTER — Ambulatory Visit: Payer: Medicare Other | Attending: Nurse Practitioner | Admitting: Nurse Practitioner

## 2022-09-09 VITALS — BP 122/52 | HR 62 | Ht 64.0 in | Wt 161.0 lb

## 2022-09-09 DIAGNOSIS — I4891 Unspecified atrial fibrillation: Secondary | ICD-10-CM

## 2022-09-09 DIAGNOSIS — N1832 Chronic kidney disease, stage 3b: Secondary | ICD-10-CM

## 2022-09-09 DIAGNOSIS — E785 Hyperlipidemia, unspecified: Secondary | ICD-10-CM

## 2022-09-09 DIAGNOSIS — I63411 Cerebral infarction due to embolism of right middle cerebral artery: Secondary | ICD-10-CM

## 2022-09-09 DIAGNOSIS — D6869 Other thrombophilia: Secondary | ICD-10-CM | POA: Diagnosis not present

## 2022-09-09 DIAGNOSIS — I10 Essential (primary) hypertension: Secondary | ICD-10-CM

## 2022-09-09 DIAGNOSIS — I5042 Chronic combined systolic (congestive) and diastolic (congestive) heart failure: Secondary | ICD-10-CM

## 2022-09-09 MED ORDER — AMIODARONE HCL 200 MG PO TABS: 200.0000 mg | ORAL_TABLET | Freq: Every day | ORAL | 3 refills | Status: DC

## 2022-09-09 NOTE — Patient Instructions (Signed)
Medication Instructions:   DECREASE Amiodarone one (1) tablet by mouth ( 200 mg) daily.   *If you need a refill on your cardiac medications before your next appointment, please call your pharmacy*   Lab Work:  None ordered.  If you have labs (blood work) drawn today and your tests are completely normal, you will receive your results only by: MyChart Message (if you have MyChart) OR A paper copy in the mail If you have any lab test that is abnormal or we need to change your treatment, we will call you to review the results.   Testing/Procedures:  None ordered.    Follow-Up: At [facility], you and your health needs are our priority.  As part of our continuing mission to provide you with exceptional heart care, we have created designated Provider Care Teams.  These Care Teams include your primary Cardiologist (physician) and Advanced Practice Providers (APPs -  Physician Assistants and Nurse Practitioners) who all work together to provide you with the care you need, when you need it.  We recommend signing up for the patient portal called "MyChart".  Sign up information is provided on this After Visit Summary.  MyChart is used to connect with patients for Virtual Visits (Telemedicine).  Patients are able to view lab/test results, encounter notes, upcoming appointments, etc.  Non-urgent messages can be sent to your provider as well.   To learn more about what you can do with MyChart, go to [email or web address].    Your next appointment:   2 month(s)  The format for your next appointment:   In Person  Provider:   Luane School, MD    Important Information About Sugar

## 2022-09-09 NOTE — Telephone Encounter (Signed)
S/w Marisue Ivan at Dr. Alonza Smoker office [phone] will fax lab to office that [facility] is getting at pt's house.

## 2022-09-10 DIAGNOSIS — I69398 Other sequelae of cerebral infarction: Secondary | ICD-10-CM | POA: Diagnosis not present

## 2022-09-10 DIAGNOSIS — I129 Hypertensive chronic kidney disease with stage 1 through stage 4 chronic kidney disease, or unspecified chronic kidney disease: Secondary | ICD-10-CM | POA: Diagnosis not present

## 2022-09-10 DIAGNOSIS — Z7901 Long term (current) use of anticoagulants: Secondary | ICD-10-CM | POA: Diagnosis not present

## 2022-09-10 DIAGNOSIS — E785 Hyperlipidemia, unspecified: Secondary | ICD-10-CM | POA: Diagnosis not present

## 2022-09-10 DIAGNOSIS — I69342 Monoplegia of lower limb following cerebral infarction affecting left dominant side: Secondary | ICD-10-CM | POA: Diagnosis not present

## 2022-09-10 DIAGNOSIS — I4891 Unspecified atrial fibrillation: Secondary | ICD-10-CM | POA: Diagnosis not present

## 2022-09-10 DIAGNOSIS — R2681 Unsteadiness on feet: Secondary | ICD-10-CM | POA: Diagnosis not present

## 2022-09-10 DIAGNOSIS — I69341 Monoplegia of lower limb following cerebral infarction affecting right dominant side: Secondary | ICD-10-CM | POA: Diagnosis not present

## 2022-09-10 DIAGNOSIS — N183 Chronic kidney disease, stage 3 unspecified: Secondary | ICD-10-CM | POA: Diagnosis not present

## 2022-09-11 ENCOUNTER — Telehealth: Payer: Self-pay | Admitting: Nurse Practitioner

## 2022-09-11 NOTE — Telephone Encounter (Signed)
Pt c/o medication issue:  1. Name of Medication: amiodarone (PACERONE) 200 MG tablet   2. How are you currently taking this medication (dosage and times per day)?   3. Are you having a reaction (difficulty breathing--STAT)?   4. What is your medication issue? Pt started taking this med last night and started feeling sick to her stomach. Her son states that she did get sick to her stomach earlier this morning, as well, and threw up. Requesting call back.

## 2022-09-11 NOTE — Telephone Encounter (Signed)
Called patient back. Patient stated she had not picked up the medication until today and to disregard her message from this morning. Patient stated they worked all out.

## 2022-09-12 ENCOUNTER — Emergency Department (HOSPITAL_COMMUNITY): Payer: Medicare Other

## 2022-09-12 ENCOUNTER — Emergency Department (HOSPITAL_COMMUNITY)
Admission: EM | Admit: 2022-09-12 | Discharge: 2022-09-12 | Disposition: A | Discharge status: Home / Self Care | Payer: Medicare Other | Attending: Emergency Medicine | Admitting: Emergency Medicine

## 2022-09-12 ENCOUNTER — Encounter (HOSPITAL_COMMUNITY): Payer: Self-pay | Admitting: Emergency Medicine

## 2022-09-12 ENCOUNTER — Other Ambulatory Visit: Payer: Self-pay

## 2022-09-12 DIAGNOSIS — Z79899 Other long term (current) drug therapy: Secondary | ICD-10-CM | POA: Diagnosis not present

## 2022-09-12 DIAGNOSIS — Z743 Need for continuous supervision: Secondary | ICD-10-CM | POA: Diagnosis not present

## 2022-09-12 DIAGNOSIS — R079 Chest pain, unspecified: Secondary | ICD-10-CM

## 2022-09-12 DIAGNOSIS — R1012 Left upper quadrant pain: Secondary | ICD-10-CM | POA: Diagnosis not present

## 2022-09-12 DIAGNOSIS — I480 Paroxysmal atrial fibrillation: Secondary | ICD-10-CM | POA: Insufficient documentation

## 2022-09-12 DIAGNOSIS — R112 Nausea with vomiting, unspecified: Secondary | ICD-10-CM | POA: Diagnosis not present

## 2022-09-12 DIAGNOSIS — Z7901 Long term (current) use of anticoagulants: Secondary | ICD-10-CM | POA: Diagnosis not present

## 2022-09-12 DIAGNOSIS — Z7982 Long term (current) use of aspirin: Secondary | ICD-10-CM | POA: Insufficient documentation

## 2022-09-12 DIAGNOSIS — K7689 Other specified diseases of liver: Secondary | ICD-10-CM | POA: Diagnosis not present

## 2022-09-12 DIAGNOSIS — I110 Hypertensive heart disease with heart failure: Secondary | ICD-10-CM | POA: Diagnosis not present

## 2022-09-12 DIAGNOSIS — Z8673 Personal history of transient ischemic attack (TIA), and cerebral infarction without residual deficits: Secondary | ICD-10-CM | POA: Insufficient documentation

## 2022-09-12 DIAGNOSIS — R0789 Other chest pain: Secondary | ICD-10-CM | POA: Insufficient documentation

## 2022-09-12 DIAGNOSIS — M4804 Spinal stenosis, thoracic region: Secondary | ICD-10-CM | POA: Diagnosis not present

## 2022-09-12 DIAGNOSIS — J984 Other disorders of lung: Secondary | ICD-10-CM | POA: Insufficient documentation

## 2022-09-12 DIAGNOSIS — J90 Pleural effusion, not elsewhere classified: Secondary | ICD-10-CM | POA: Diagnosis not present

## 2022-09-12 DIAGNOSIS — K862 Cyst of pancreas: Secondary | ICD-10-CM | POA: Diagnosis not present

## 2022-09-12 DIAGNOSIS — I700 Atherosclerosis of aorta: Secondary | ICD-10-CM | POA: Diagnosis not present

## 2022-09-12 DIAGNOSIS — I5031 Acute diastolic (congestive) heart failure: Secondary | ICD-10-CM | POA: Insufficient documentation

## 2022-09-12 DIAGNOSIS — M4854XA Collapsed vertebra, not elsewhere classified, thoracic region, initial encounter for fracture: Secondary | ICD-10-CM | POA: Diagnosis not present

## 2022-09-12 DIAGNOSIS — M546 Pain in thoracic spine: Secondary | ICD-10-CM | POA: Diagnosis not present

## 2022-09-12 DIAGNOSIS — S22050A Wedge compression fracture of T5-T6 vertebra, initial encounter for closed fracture: Secondary | ICD-10-CM | POA: Diagnosis not present

## 2022-09-12 DIAGNOSIS — R0781 Pleurodynia: Secondary | ICD-10-CM | POA: Diagnosis not present

## 2022-09-12 DIAGNOSIS — S2220XA Unspecified fracture of sternum, initial encounter for closed fracture: Secondary | ICD-10-CM | POA: Diagnosis not present

## 2022-09-12 DIAGNOSIS — S32511A Fracture of superior rim of right pubis, initial encounter for closed fracture: Secondary | ICD-10-CM | POA: Diagnosis not present

## 2022-09-12 DIAGNOSIS — J9811 Atelectasis: Secondary | ICD-10-CM | POA: Diagnosis not present

## 2022-09-12 DIAGNOSIS — I499 Cardiac arrhythmia, unspecified: Secondary | ICD-10-CM | POA: Diagnosis not present

## 2022-09-12 DIAGNOSIS — R6889 Other general symptoms and signs: Secondary | ICD-10-CM | POA: Diagnosis not present

## 2022-09-12 LAB — BASIC METABOLIC PANEL
Anion gap: 8 (ref 5–15)
BUN: 19 mg/dL (ref 8–23)
CO2: 23 mmol/L (ref 22–32)
Calcium: 8.5 mg/dL — ABNORMAL LOW (ref 8.9–10.3)
Chloride: 110 mmol/L (ref 98–111)
Creatinine, Ser: 1.31 mg/dL — ABNORMAL HIGH (ref 0.44–1.00)
GFR, Estimated: 38 mL/min — ABNORMAL LOW (ref >60)
Glucose, Bld: 97 mg/dL (ref 70–99)
Potassium: 3.6 mmol/L (ref 3.5–5.1)
Sodium: 141 mmol/L (ref 135–145)

## 2022-09-12 LAB — TROPONIN I (HIGH SENSITIVITY)
Troponin I (High Sensitivity): 6 ng/L (ref <18)
Troponin I (High Sensitivity): 7 ng/L (ref <18)

## 2022-09-12 LAB — CBC WITH DIFFERENTIAL/PLATELET
Abs Immature Granulocytes: 0.02 10*3/uL (ref 0.00–0.07)
Basophils Absolute: 0 10*3/uL (ref 0.0–0.1)
Basophils Relative: 1 %
Eosinophils Absolute: 0.1 10*3/uL (ref 0.0–0.5)
Eosinophils Relative: 3 %
HCT: 39.7 % (ref 36.0–46.0)
Hemoglobin: 12.3 g/dL (ref 12.0–15.0)
Immature Granulocytes: 1 %
Lymphocytes Relative: 24 %
Lymphs Abs: 1 10*3/uL (ref 0.7–4.0)
MCH: 29.8 pg (ref 26.0–34.0)
MCHC: 31 g/dL (ref 30.0–36.0)
MCV: 96.1 fL (ref 80.0–100.0)
Monocytes Absolute: 0.3 10*3/uL (ref 0.1–1.0)
Monocytes Relative: 7 %
Neutro Abs: 2.6 10*3/uL (ref 1.7–7.7)
Neutrophils Relative %: 64 %
Platelets: 186 10*3/uL (ref 150–400)
RBC: 4.13 MIL/uL (ref 3.87–5.11)
RDW: 13.9 % (ref 11.5–15.5)
WBC: 4.1 10*3/uL (ref 4.0–10.5)
nRBC: 0 % (ref 0.0–0.2)

## 2022-09-12 LAB — D-DIMER, QUANTITATIVE: D-Dimer, Quant: <0.27 ug/mL-FEU (ref 0.00–0.50)

## 2022-09-12 MED ORDER — HYDRALAZINE HCL 25 MG PO TABS
25.0000 mg | ORAL_TABLET | Freq: Once | ORAL | Status: AC
  Administered 2022-09-12: 25 mg via ORAL
  Filled 2022-09-12: qty 1

## 2022-09-12 MED ORDER — ACETAMINOPHEN 325 MG PO TABS
650.0000 mg | ORAL_TABLET | Freq: Once | ORAL | Status: AC
  Administered 2022-09-12: 650 mg via ORAL
  Filled 2022-09-12: qty 2

## 2022-09-12 MED ORDER — METOPROLOL TARTRATE 25 MG PO TABS
25.0000 mg | ORAL_TABLET | Freq: Two times a day (BID) | ORAL | Status: DC
  Administered 2022-09-12: 25 mg via ORAL
  Filled 2022-09-12: qty 1

## 2022-09-12 MED ORDER — APIXABAN 5 MG PO TABS
5.0000 mg | ORAL_TABLET | Freq: Two times a day (BID) | ORAL | Status: DC
  Administered 2022-09-12: 5 mg via ORAL
  Filled 2022-09-12: qty 1

## 2022-09-12 MED ORDER — IOHEXOL 300 MG/ML  SOLN
80.0000 mL | Freq: Once | INTRAMUSCULAR | Status: AC | PRN
  Administered 2022-09-12: 80 mL via INTRAVENOUS

## 2022-09-12 MED ORDER — AMIODARONE HCL 200 MG PO TABS
200.0000 mg | ORAL_TABLET | Freq: Every day | ORAL | Status: DC
  Administered 2022-09-12: 200 mg via ORAL
  Filled 2022-09-12: qty 1

## 2022-09-12 NOTE — Discharge Instructions (Addendum)
You can take Tylenol every 6 hours at home as needed for pain.  Please follow-up with your primary care doctor's office.

## 2022-09-12 NOTE — ED Triage Notes (Signed)
Pt states she vomited last night and began to have pain under the left rib that is worse when lying down. No c/o dyspnea or SOB. Pt denies any falls.

## 2022-09-12 NOTE — ED Provider Notes (Signed)
[facility] EMERGENCY DEPARTMENT Provider Note   CSN: [id number] Arrival date & time: 09/12/22  0701     History  Chief Complaint  Patient presents with   Chest Pain    Rib pain    HELMI LASCALA is a 88 y.o. female w/ hx of CVA, A Fib on eliquis and amiodarone (and metoprolol), presenting to ED with left lower chest discomfort.  She reports onset yesterday, felt nauseated and vomited, feels "ache" in left lower chest.  Unsure if it is constant or waxing and waning.  Reported to nursing it was worse with inspiration and movement ("If I sit still it doesn't hurt, but if I lay down it hurts") but told me it is not positional.  Never had this feeling before.  External records reviewed, patient was just discharged from the hospital approximately 10 days ago at the end of November after being admitted for acute diastolic congestive heart failure, given IV diuretics.  She has a history noted of hypertension for which he is on metoprolol and hydralazine, embolic stroke status post thrombectomy for which she is on apixaban and aspirin.  She has paroxysmal A-fib for which she is on oral amiodarone.  And she had hypokalemia that was supplemented in the hospital.  She lives at home with her son  HPI     Home Medications Prior to Admission medications   Medication Sig Start Date End Date Taking? Authorizing Provider  amiodarone (PACERONE) 200 MG tablet Take 1 tablet (200 mg total) by mouth daily. 09/09/22 09/10/23 Yes Swinyer, Lanice Schwab, NP  apixaban (ELIQUIS) 5 MG TABS tablet Take 1 tablet (5 mg total) by mouth 2 (two) times daily. 08/28/22  Yes Janine Ores, NP  aspirin EC 81 MG tablet Take 1 tablet (81 mg total) by mouth daily. Swallow whole. 08/29/22  Yes Shafer, Marcelino Scot, NP  docusate sodium (COLACE) 100 MG capsule Take 100 mg by mouth daily as needed for mild constipation.   Yes [provider]  furosemide (LASIX) 20 MG tablet Take 2 tablets (40 mg total) by mouth daily. Patient  taking differently: Take 20 mg by mouth daily. 09/02/22 12/31/22 Yes Bonnielee Haff, MD  hydrALAZINE (APRESOLINE) 25 MG tablet Take 1 tablet (25 mg total) by mouth every 8 (eight) hours. 09/02/22  Yes Bonnielee Haff, MD  metoprolol tartrate (LOPRESSOR) 25 MG tablet Take 1 tablet (25 mg total) by mouth 2 (two) times daily. 08/28/22  Yes Shafer, Marcelino Scot, NP  pantoprazole (PROTONIX) 40 MG tablet Take 1 tablet (40 mg total) by mouth daily at 12 noon. 09/02/22  Yes Bonnielee Haff, MD  potassium chloride SA (KLOR-CON M) 20 MEQ tablet Take 1 tablet (20 mEq total) by mouth daily. 09/02/22  Yes Bonnielee Haff, MD      Allergies    Patient has no known allergies.    Review of Systems   Review of Systems  Physical Exam Updated Vital Signs BP (!) 161/50   Pulse 62   Temp 98.1 F (36.7 C) (Oral)   Resp 16   Ht 5\' 4"  (1.626 m)   Wt 73 kg   SpO2 97%   BMI 27.62 kg/m  Physical Exam Constitutional:      General: She is not in acute distress. HENT:     Head: Normocephalic and atraumatic.  Eyes:     Conjunctiva/sclera: Conjunctivae normal.     Pupils: Pupils are equal, round, and reactive to light.  Cardiovascular:     Rate and Rhythm: Normal rate. Rhythm irregular.  Pulmonary:     Effort: Pulmonary effort is normal. No respiratory distress.  Abdominal:     General: There is no distension.     Tenderness: There is no abdominal tenderness.  Musculoskeletal:     Comments: Chest wall without any significant tenderness  Skin:    General: Skin is warm and dry.  Neurological:     General: No focal deficit present.     Mental Status: She is alert. Mental status is at baseline.  Psychiatric:        Mood and Affect: Mood normal.        Behavior: Behavior normal.     ED Results / Procedures / Treatments   Labs (all labs ordered are listed, but only abnormal results are displayed) Labs Reviewed  BASIC METABOLIC PANEL - Abnormal; Notable for the following components:      Result Value    Creatinine, Ser 1.31 (*)    Calcium 8.5 (*)    GFR, Estimated 38 (*)    All other components within normal limits  CBC WITH DIFFERENTIAL/PLATELET  D-DIMER, QUANTITATIVE  TROPONIN I (HIGH SENSITIVITY)  TROPONIN I (HIGH SENSITIVITY)    EKG EKG Interpretation  Date/Time:  Thursday September 12 2022 07:24:11 EST Ventricular Rate:  54 PR Interval:  199 QRS Duration: 87 QT Interval:  489 QTC Calculation: 464 R Axis:   73 Text Interpretation: Sinus rhythm No significant changes from prior tracing Aug 30 2022, T wave inversion V1 seen on prior tracing.  No STEMI Confirmed by Octaviano Glow [phone]) on 09/12/2022 7:27:08 AM  Radiology CT T-SPINE NO CHARGE  Result Date: 09/12/2022 CLINICAL DATA:  Left lower chest and left upper abdominal pain, nausea, and vomiting. Patient reports he vomited last night and began to have pain under left rib that is worse when lying down. EXAM: CT THORACIC SPINE WITHOUT CONTRAST TECHNIQUE: Multidetector CT images of the thoracic were obtained using the standard protocol without intravenous contrast. RADIATION DOSE REDUCTION: This exam was performed according to the departmental dose-optimization program which includes automated exposure control, adjustment of the mA and/or kV according to patient size and/or use of iterative reconstruction technique. COMPARISON:  Chest two views 09/12/2022, AP chest 09/01/2022, CTA chest 08/27/2017 FINDINGS: Alignment: No sagittal spondylolisthesis. Mild dextrocurvature centered at T7-8. Vertebrae: There is severe greater than 90% mid anterior height loss of the T5 vertebral body, new from 08/27/2017. Low bone mineralization limits evaluation on the lateral chest radiograph 08/30/2022, however similar high-grade height loss is favored at the T5 level on that radiograph. There is mild approximate 3 mm retropulsion of the mid height of the T5 vertebral body. Mild-to-moderate T7 vertebral body height loss anteriorly is unchanged from  08/27/2017. Minimal 2 mm grade 1 anterolisthesis of C6 on C7. Mild anterior T7-8 and mild anterior T8-9 disc space narrowing with anterior T7-8 through T9-10 degenerative vacuum phenomenon. Partial visualization of reverse total right shoulder arthroplasty hardware. Paraspinal and other soft tissues: Please see contemporaneous CT of the chest for evaluation of the intrathoracic soft tissues. There is moderate cardiomegaly. Dense mitral annular calcifications. Moderate atherosclerotic calcifications within the thoracic aorta there are dense coronary artery calcifications. Cholecystectomy clips. Curvilinear subsegmental atelectasis within the bilateral lower lungs, similar to 08/27/2017. Disc levels: T2-3: Facet joint hypertrophy contributes to mild bilateral neuroforaminal stenosis. T4-5: Facet joint hypertrophy contributes to mild left neuroforaminal stenosis. Mild retropulsion of the posterior T5 vertebral body with only very mild central canal narrowing. T5-6: The T5 vertebral body compression fracture and bilateral facet joint  hypertrophy contribute to moderate to severe left and moderate right neuroforaminal stenosis. No central canal stenosis. L1-2: Right-greater-than-left facet joint hypertrophy and mild to moderate posterior disc bulge contribute to mild right neuroforaminal stenosis. IMPRESSION: 1. Severe mid anterior height loss of the T5 vertebral body, new from 08/27/2017. There is likely similar height loss on lateral chest radiograph 08/30/2022, and at least the majority of this compression is favored to be subacute to chronic. The high degree of this compression fracture involves lucencies extending throughout the majority of the superior T5 endplate, and it is difficult to exclude an acute to subacute component. 2. Mild-to-moderate T7 vertebral body height loss, unchanged from 08/27/2017 and chronic. 3. Multilevel degenerative disc and joint changes as above. Aortic Atherosclerosis (ICD10-I70.0).  Electronically Signed   By: Yvonne Kendall M.D.   On: 09/12/2022 11:47   CT CHEST ABDOMEN PELVIS W CONTRAST  Result Date: 09/12/2022 CLINICAL DATA:  Left lower chest and left upper abdominal pain, nausea and vomiting. Evaluate for left lower chest pneumonia versus small bowel obstruction versus diverticulitis. EXAM: CT CHEST, ABDOMEN, AND PELVIS WITH CONTRAST TECHNIQUE: Multidetector CT imaging of the chest, abdomen and pelvis was performed following the standard protocol during bolus administration of intravenous contrast. RADIATION DOSE REDUCTION: This exam was performed according to the departmental dose-optimization program which includes automated exposure control, adjustment of the mA and/or kV according to patient size and/or use of iterative reconstruction technique. CONTRAST:  16mL OMNIPAQUE IOHEXOL 300 MG/ML  SOLN COMPARISON:  CT AP 08/31/2022 and CT angio chest 08/27/2017 FINDINGS: CT CHEST FINDINGS Cardiovascular: Normal heart size. No pericardial effusion. Coronary artery and aortic atherosclerotic calcifications. Mediastinum/Nodes: Thyroid gland, trachea, and esophagus are unremarkable. No enlarged supraclavicular, axillary, mediastinal, or hilar lymph nodes. Lungs/Pleura: No pleural fluid. Unchanged scarring with mild volume loss within the posterior right middle lobe and lingula. Subsegmental atelectasis versus scar noted within the left lung base. No pneumothorax identified. Musculoskeletal: Right shoulder reverse arthroplasty. Healed mid body of sternum fracture is again identified, image 108/7. T5 vertebral plana deformity is unchanged compared with 08/26/2022. Mild compression deformity involving T7 is also stable. No acute abnormality identified. CT ABDOMEN PELVIS FINDINGS Hepatobiliary: Cyst is noted within segment 5 of the liver. No suspicious liver lesions identified. Status post cholecystectomy. No significant bile duct dilatation. Pancreas: No main duct dilatation or inflammation  identified. Unchanged cystic lesion within body of pancreas measuring 1.3 cm, image 53/4. Spleen: Normal in size without focal abnormality. Adrenals/Urinary Tract: Normal adrenal glands. No nephrolithiasis, hydronephrosis or kidney mass. Urinary bladder is unremarkable. Stomach/Bowel: Stomach appears normal. Status post appendectomy. No pathologic dilatation of the large or small bowel loops. No bowel wall thickening or inflammation. Vascular/Lymphatic: Extensive aortic atherosclerosis. No aneurysm. No signs of abdominopelvic adenopathy. Reproductive: Uterus and bilateral adnexa are unremarkable. Other: There is no ascites or focal fluid collections. No signs of pneumoperitoneum. Musculoskeletal: No acute abnormality. Healed right superior and inferior pubic rami fractures. Diffuse osteopenia. IMPRESSION: 1. No acute findings within the chest, abdomen or pelvis. 2. Unchanged scarring with mild volume loss within the posterior right middle lobe and lingula. 3. Unchanged cystic lesion within body of pancreas measuring 1.3 cm. According to consensus criteria follow-up imaging in 24 months with pancreas protocol CT or MRI is recommended. This recommendation follows ACR consensus guidelines: Managing Incidental Findings on Abdominal CT: White Paper of the ACR Incidental Findings Committee. J Am Coll Radiol 2010;7:754-773 4. Stable compression deformities within the thoracic spine including vertebra plana deformity involving the T5 vertebra. Remote  fractures are also noted involving the mid body of sternum and right superior and inferior pubic rami. 5. Aortic Atherosclerosis (ICD10-I70.0). Electronically Signed   By: Signa Kell M.D.   On: 09/12/2022 11:29   DG Chest Portable 1 View  Result Date: 09/12/2022 CLINICAL DATA:  Chest pain EXAM: PORTABLE CHEST 1 VIEW COMPARISON:  Chest x-ray dated August 22, 2022 FINDINGS: Unchanged cardiomegaly. Small bilateral pleural effusions and bibasilar atelectasis. No evidence  of pneumothorax. IMPRESSION: Small bilateral pleural effusions and bibasilar atelectasis. Electronically Signed   By: Allegra Lai M.D.   On: 09/12/2022 08:23    Procedures Procedures    Medications Ordered in ED Medications  metoprolol tartrate (LOPRESSOR) tablet 25 mg (25 mg Oral Given 09/12/22 0746)  amiodarone (PACERONE) tablet 200 mg (200 mg Oral Given 09/12/22 0925)  apixaban (ELIQUIS) tablet 5 mg (5 mg Oral Given 09/12/22 0746)  acetaminophen (TYLENOL) tablet 650 mg (has no administration in time range)  hydrALAZINE (APRESOLINE) tablet 25 mg (25 mg Oral Given 09/12/22 0746)  iohexol (OMNIPAQUE) 300 MG/ML solution 80 mL (80 mLs Intravenous Contrast Given 09/12/22 1017)    ED Course/ Medical Decision Making/ A&P Clinical Course as of 09/12/22 1204  Thu Sep 12, 2022  0944 Workup thus far has been unremarkable.  Troponin negative.  D-dimer undetectable.  I have ordered a CT scan of the chest to evaluate for tensional pneumonia as there was a pleural effusion noted on x-ray, and also a thoracic spine image to ensure there is no compression fracture or referred pain [MT]  1017 Patient's son now present at the bedside and reports that the patient's symptoms began with feeling bloated and having nausea and vomiting yesterday, subsequently she developed this discomfort in her lower chest or upper abdomen.  She had been constipated for several days and had needed to take prune juice or laxative to bolster her bowel movement.  There is no bit of clinical information I have expanded her CT imaging to include the chest abdomen pelvis, searching for pathology in the left lower chest as well as the left upper quadrant of the abdomen, which could include an ileus, diverticulitis, or even bowel obstruction.  The patient and her son are in agreement with this workup. [MT]  1202 Discussed the patient's workup with her and her son.  I suspect her pain may be referred from the thoracic spine as it is quite  positional.  They do not want to see a specialist as do not want any type of intervention and her pain is relatively controlled otherwise.  We can put her on Tylenol at home.  Okay for discharge [MT]    Clinical Course User Index [MT] Lesleyanne Politte, Kermit Balo, MD                           Medical Decision Making Amount and/or Complexity of Data Reviewed Labs: ordered. Radiology: ordered. ECG/medicine tests: ordered.  Risk OTC drugs. Prescription drug management.   This patient presents to the Emergency Department with complaint of chest pain. This involves an extensive number of treatment options, and is a complaint that carries with it a high risk of complications and morbidity, given the patient's comorbidity, including cardiovascular risk factors.The differential diagnosis includes ACS vs Pneumothorax vs Reflux/Gastritis vs MSK pain vs Pneumonia vs other.  I felt PE was less likely given that her D-dimer is negative  I ordered, reviewed, and interpreted labs.  Pertinent results include troponin negative, labs  unremarkable I ordered medication tylenol  for chest pain I ordered imaging studies which included x-ray of the chest, CT imaging of the chest abdomen pelvis I independently visualized and interpreted imaging which showed small pleural effusion, no emergent findings of the chest abdomen pelvis, acute and possible chronic compression fractures of the thoracic spine and the monitor tracing which showed no emergent findings . I agree with the radiologist interpretation Additional history was obtained from patient's son at bedside External records obtained and reviewed showing recent hospitalization course and discharge summary I personally reviewed the patients ECG which showed sinus rhythm with no acute ischemic findings  After the interventions stated above, I reevaluated the patient and found that they were stable  Based on the patient's clinical exam, vital signs, risk factors, and  ED testing, I felt that the patient's overall risk of life-threatening emergency such as ACS, PE, sepsis, or infection was low.  At this time, I felt the patient's presentation was most clinically consistent with referred thoracic pain, but explained to the patient that this evaluation was not a definitive diagnostic workup.  I discussed outpatient follow up with primary care provider, and provided specialist office number on the patient's discharge paper if a referral was deemed necessary.  Return precautions were discussed with the patient.  I felt the patient was clinically stable for discharge.         Final Clinical Impression(s) / ED Diagnoses Final diagnoses:  Thoracic spine pain  Left-sided chest pain    Rx / DC Orders ED Discharge Orders     None         Wyvonnia Dusky, MD 09/12/22 1204

## 2022-09-13 DIAGNOSIS — Z7901 Long term (current) use of anticoagulants: Secondary | ICD-10-CM | POA: Diagnosis not present

## 2022-09-13 DIAGNOSIS — N183 Chronic kidney disease, stage 3 unspecified: Secondary | ICD-10-CM | POA: Diagnosis not present

## 2022-09-13 DIAGNOSIS — I69398 Other sequelae of cerebral infarction: Secondary | ICD-10-CM | POA: Diagnosis not present

## 2022-09-13 DIAGNOSIS — E785 Hyperlipidemia, unspecified: Secondary | ICD-10-CM | POA: Diagnosis not present

## 2022-09-13 DIAGNOSIS — I69341 Monoplegia of lower limb following cerebral infarction affecting right dominant side: Secondary | ICD-10-CM | POA: Diagnosis not present

## 2022-09-13 DIAGNOSIS — R2681 Unsteadiness on feet: Secondary | ICD-10-CM | POA: Diagnosis not present

## 2022-09-13 DIAGNOSIS — I129 Hypertensive chronic kidney disease with stage 1 through stage 4 chronic kidney disease, or unspecified chronic kidney disease: Secondary | ICD-10-CM | POA: Diagnosis not present

## 2022-09-13 DIAGNOSIS — I69342 Monoplegia of lower limb following cerebral infarction affecting left dominant side: Secondary | ICD-10-CM | POA: Diagnosis not present

## 2022-09-13 DIAGNOSIS — I4891 Unspecified atrial fibrillation: Secondary | ICD-10-CM | POA: Diagnosis not present

## 2022-09-16 DIAGNOSIS — I69342 Monoplegia of lower limb following cerebral infarction affecting left dominant side: Secondary | ICD-10-CM | POA: Diagnosis not present

## 2022-09-16 DIAGNOSIS — N183 Chronic kidney disease, stage 3 unspecified: Secondary | ICD-10-CM | POA: Diagnosis not present

## 2022-09-16 DIAGNOSIS — I129 Hypertensive chronic kidney disease with stage 1 through stage 4 chronic kidney disease, or unspecified chronic kidney disease: Secondary | ICD-10-CM | POA: Diagnosis not present

## 2022-09-16 DIAGNOSIS — Z7901 Long term (current) use of anticoagulants: Secondary | ICD-10-CM | POA: Diagnosis not present

## 2022-09-16 DIAGNOSIS — E785 Hyperlipidemia, unspecified: Secondary | ICD-10-CM | POA: Diagnosis not present

## 2022-09-16 DIAGNOSIS — I4891 Unspecified atrial fibrillation: Secondary | ICD-10-CM | POA: Diagnosis not present

## 2022-09-16 DIAGNOSIS — I69341 Monoplegia of lower limb following cerebral infarction affecting right dominant side: Secondary | ICD-10-CM | POA: Diagnosis not present

## 2022-09-16 DIAGNOSIS — I69398 Other sequelae of cerebral infarction: Secondary | ICD-10-CM | POA: Diagnosis not present

## 2022-09-16 DIAGNOSIS — R2681 Unsteadiness on feet: Secondary | ICD-10-CM | POA: Diagnosis not present

## 2022-09-24 ENCOUNTER — Telehealth: Payer: Self-pay

## 2022-09-24 DIAGNOSIS — E785 Hyperlipidemia, unspecified: Secondary | ICD-10-CM | POA: Diagnosis not present

## 2022-09-24 DIAGNOSIS — N183 Chronic kidney disease, stage 3 unspecified: Secondary | ICD-10-CM | POA: Diagnosis not present

## 2022-09-24 DIAGNOSIS — I129 Hypertensive chronic kidney disease with stage 1 through stage 4 chronic kidney disease, or unspecified chronic kidney disease: Secondary | ICD-10-CM | POA: Diagnosis not present

## 2022-09-24 DIAGNOSIS — I69342 Monoplegia of lower limb following cerebral infarction affecting left dominant side: Secondary | ICD-10-CM | POA: Diagnosis not present

## 2022-09-24 DIAGNOSIS — I4891 Unspecified atrial fibrillation: Secondary | ICD-10-CM | POA: Diagnosis not present

## 2022-09-24 DIAGNOSIS — I69398 Other sequelae of cerebral infarction: Secondary | ICD-10-CM | POA: Diagnosis not present

## 2022-09-24 DIAGNOSIS — Z7901 Long term (current) use of anticoagulants: Secondary | ICD-10-CM | POA: Diagnosis not present

## 2022-09-24 DIAGNOSIS — R2681 Unsteadiness on feet: Secondary | ICD-10-CM | POA: Diagnosis not present

## 2022-09-24 DIAGNOSIS — I69341 Monoplegia of lower limb following cerebral infarction affecting right dominant side: Secondary | ICD-10-CM | POA: Diagnosis not present

## 2022-09-24 NOTE — Telephone Encounter (Signed)
     Patient  visit on 12/7  at [facility]   Have you been able to follow up with your primary care physician? Yes on Thursday  The patient was or was not able to obtain any needed medicine or equipment. Yes   Are there diet recommendations that you are having difficulty following? NA  Patient expresses understanding of discharge instructions and education provided has no other needs at this time.  Yes      Lenard Forth [facility] Guide, [facility], Care Management  [phone] [address] [place], [place], [place] [address] Phone: [phone] Email: [email or web address]

## 2022-09-27 ENCOUNTER — Other Ambulatory Visit: Payer: Self-pay

## 2022-09-27 ENCOUNTER — Other Ambulatory Visit (HOSPITAL_COMMUNITY): Payer: Self-pay

## 2022-09-27 DIAGNOSIS — D6869 Other thrombophilia: Secondary | ICD-10-CM | POA: Diagnosis not present

## 2022-09-27 DIAGNOSIS — Z823 Family history of stroke: Secondary | ICD-10-CM | POA: Diagnosis not present

## 2022-09-27 DIAGNOSIS — I10 Essential (primary) hypertension: Secondary | ICD-10-CM | POA: Diagnosis not present

## 2022-09-27 DIAGNOSIS — I480 Paroxysmal atrial fibrillation: Secondary | ICD-10-CM | POA: Diagnosis not present

## 2022-09-27 MED ORDER — METOPROLOL TARTRATE 25 MG PO TABS
25.0000 mg | ORAL_TABLET | Freq: Two times a day (BID) | ORAL | 4 refills | Status: DC
  Filled 2022-09-27 – 2022-12-19 (×4): qty 180, 90d supply, fill #0
  Filled 2023-02-17: qty 180, 90d supply, fill #1

## 2022-09-27 MED ORDER — METOPROLOL TARTRATE 25 MG PO TABS
25.0000 mg | ORAL_TABLET | Freq: Two times a day (BID) | ORAL | 4 refills | Status: DC
  Filled 2022-09-27: qty 180, 90d supply, fill #0

## 2022-09-27 MED ORDER — AMIODARONE HCL 200 MG PO TABS
200.0000 mg | ORAL_TABLET | Freq: Every day | ORAL | 4 refills | Status: DC
  Filled 2022-09-27 – 2022-10-24 (×4): qty 90, 90d supply, fill #0

## 2022-10-01 ENCOUNTER — Other Ambulatory Visit (HOSPITAL_COMMUNITY): Payer: Self-pay

## 2022-10-01 MED ORDER — AMIODARONE HCL 200 MG PO TABS
200.0000 mg | ORAL_TABLET | Freq: Every day | ORAL | 4 refills | Status: DC
  Filled 2022-10-01 – 2022-10-20 (×2): qty 90, 90d supply, fill #0

## 2022-10-02 ENCOUNTER — Other Ambulatory Visit (HOSPITAL_COMMUNITY): Payer: Self-pay

## 2022-10-03 DIAGNOSIS — R2681 Unsteadiness on feet: Secondary | ICD-10-CM | POA: Diagnosis not present

## 2022-10-03 DIAGNOSIS — N183 Chronic kidney disease, stage 3 unspecified: Secondary | ICD-10-CM | POA: Diagnosis not present

## 2022-10-03 DIAGNOSIS — E785 Hyperlipidemia, unspecified: Secondary | ICD-10-CM | POA: Diagnosis not present

## 2022-10-03 DIAGNOSIS — I129 Hypertensive chronic kidney disease with stage 1 through stage 4 chronic kidney disease, or unspecified chronic kidney disease: Secondary | ICD-10-CM | POA: Diagnosis not present

## 2022-10-03 DIAGNOSIS — I69398 Other sequelae of cerebral infarction: Secondary | ICD-10-CM | POA: Diagnosis not present

## 2022-10-03 DIAGNOSIS — I69342 Monoplegia of lower limb following cerebral infarction affecting left dominant side: Secondary | ICD-10-CM | POA: Diagnosis not present

## 2022-10-03 DIAGNOSIS — I69341 Monoplegia of lower limb following cerebral infarction affecting right dominant side: Secondary | ICD-10-CM | POA: Diagnosis not present

## 2022-10-03 DIAGNOSIS — I4891 Unspecified atrial fibrillation: Secondary | ICD-10-CM | POA: Diagnosis not present

## 2022-10-03 DIAGNOSIS — Z7901 Long term (current) use of anticoagulants: Secondary | ICD-10-CM | POA: Diagnosis not present

## 2022-10-08 DIAGNOSIS — I129 Hypertensive chronic kidney disease with stage 1 through stage 4 chronic kidney disease, or unspecified chronic kidney disease: Secondary | ICD-10-CM | POA: Diagnosis not present

## 2022-10-08 DIAGNOSIS — Z7901 Long term (current) use of anticoagulants: Secondary | ICD-10-CM | POA: Diagnosis not present

## 2022-10-08 DIAGNOSIS — R2681 Unsteadiness on feet: Secondary | ICD-10-CM | POA: Diagnosis not present

## 2022-10-08 DIAGNOSIS — I69341 Monoplegia of lower limb following cerebral infarction affecting right dominant side: Secondary | ICD-10-CM | POA: Diagnosis not present

## 2022-10-08 DIAGNOSIS — E785 Hyperlipidemia, unspecified: Secondary | ICD-10-CM | POA: Diagnosis not present

## 2022-10-08 DIAGNOSIS — I69342 Monoplegia of lower limb following cerebral infarction affecting left dominant side: Secondary | ICD-10-CM | POA: Diagnosis not present

## 2022-10-08 DIAGNOSIS — N183 Chronic kidney disease, stage 3 unspecified: Secondary | ICD-10-CM | POA: Diagnosis not present

## 2022-10-08 DIAGNOSIS — I4891 Unspecified atrial fibrillation: Secondary | ICD-10-CM | POA: Diagnosis not present

## 2022-10-08 DIAGNOSIS — I69398 Other sequelae of cerebral infarction: Secondary | ICD-10-CM | POA: Diagnosis not present

## 2022-10-15 DIAGNOSIS — R2681 Unsteadiness on feet: Secondary | ICD-10-CM | POA: Diagnosis not present

## 2022-10-15 DIAGNOSIS — I69341 Monoplegia of lower limb following cerebral infarction affecting right dominant side: Secondary | ICD-10-CM | POA: Diagnosis not present

## 2022-10-15 DIAGNOSIS — E785 Hyperlipidemia, unspecified: Secondary | ICD-10-CM | POA: Diagnosis not present

## 2022-10-15 DIAGNOSIS — N183 Chronic kidney disease, stage 3 unspecified: Secondary | ICD-10-CM | POA: Diagnosis not present

## 2022-10-15 DIAGNOSIS — I4891 Unspecified atrial fibrillation: Secondary | ICD-10-CM | POA: Diagnosis not present

## 2022-10-15 DIAGNOSIS — I129 Hypertensive chronic kidney disease with stage 1 through stage 4 chronic kidney disease, or unspecified chronic kidney disease: Secondary | ICD-10-CM | POA: Diagnosis not present

## 2022-10-15 DIAGNOSIS — I69342 Monoplegia of lower limb following cerebral infarction affecting left dominant side: Secondary | ICD-10-CM | POA: Diagnosis not present

## 2022-10-15 DIAGNOSIS — Z7901 Long term (current) use of anticoagulants: Secondary | ICD-10-CM | POA: Diagnosis not present

## 2022-10-15 DIAGNOSIS — I69398 Other sequelae of cerebral infarction: Secondary | ICD-10-CM | POA: Diagnosis not present

## 2022-10-21 ENCOUNTER — Other Ambulatory Visit (HOSPITAL_COMMUNITY): Payer: Self-pay

## 2022-10-21 DIAGNOSIS — I129 Hypertensive chronic kidney disease with stage 1 through stage 4 chronic kidney disease, or unspecified chronic kidney disease: Secondary | ICD-10-CM | POA: Diagnosis not present

## 2022-10-21 DIAGNOSIS — I69398 Other sequelae of cerebral infarction: Secondary | ICD-10-CM | POA: Diagnosis not present

## 2022-10-21 DIAGNOSIS — N183 Chronic kidney disease, stage 3 unspecified: Secondary | ICD-10-CM | POA: Diagnosis not present

## 2022-10-21 DIAGNOSIS — I69341 Monoplegia of lower limb following cerebral infarction affecting right dominant side: Secondary | ICD-10-CM | POA: Diagnosis not present

## 2022-10-21 DIAGNOSIS — I4891 Unspecified atrial fibrillation: Secondary | ICD-10-CM | POA: Diagnosis not present

## 2022-10-21 DIAGNOSIS — I69342 Monoplegia of lower limb following cerebral infarction affecting left dominant side: Secondary | ICD-10-CM | POA: Diagnosis not present

## 2022-10-21 DIAGNOSIS — E785 Hyperlipidemia, unspecified: Secondary | ICD-10-CM | POA: Diagnosis not present

## 2022-10-21 DIAGNOSIS — R2681 Unsteadiness on feet: Secondary | ICD-10-CM | POA: Diagnosis not present

## 2022-10-21 DIAGNOSIS — Z7901 Long term (current) use of anticoagulants: Secondary | ICD-10-CM | POA: Diagnosis not present

## 2022-10-24 ENCOUNTER — Other Ambulatory Visit (HOSPITAL_COMMUNITY): Payer: Self-pay

## 2022-10-24 ENCOUNTER — Other Ambulatory Visit: Payer: Self-pay

## 2022-10-28 ENCOUNTER — Encounter: Payer: Self-pay | Admitting: Neurology

## 2022-10-28 ENCOUNTER — Ambulatory Visit (INDEPENDENT_AMBULATORY_CARE_PROVIDER_SITE_OTHER): Payer: Medicare Other | Admitting: Neurology

## 2022-10-28 VITALS — BP 175/76 | HR 77 | Ht 64.0 in | Wt 162.6 lb

## 2022-10-28 DIAGNOSIS — I6601 Occlusion and stenosis of right middle cerebral artery: Secondary | ICD-10-CM | POA: Diagnosis not present

## 2022-10-28 DIAGNOSIS — I4891 Unspecified atrial fibrillation: Secondary | ICD-10-CM

## 2022-10-28 DIAGNOSIS — G8194 Hemiplegia, unspecified affecting left nondominant side: Secondary | ICD-10-CM

## 2022-10-28 DIAGNOSIS — R251 Tremor, unspecified: Secondary | ICD-10-CM | POA: Diagnosis not present

## 2022-10-28 NOTE — Patient Instructions (Signed)
I had a long d/w patient and her sister about her recent stroke,tremors, risk for recurrent stroke/TIAs, personally independently reviewed imaging studies and stroke evaluation results and answered questions.Continue Eliquis (apixaban)  5 mg twice daily  for secondary stroke prevention and maintain strict control of hypertension with blood pressure goal below 130/90, diabetes with hemoglobin A1c goal below 6.5% and lipids with LDL cholesterol goal below 70 mg/dL. I also advised the patient to eat a healthy diet with plenty of whole grains, cereals, fruits and vegetables, exercise regularly and maintain ideal body weight . Check TFTs, B 12, CMP and cbc and consider reducing amiodarone dose after d/w cardiologist to help with her tremors.Followup in the future with me in  3 months or call earlier if needed.  Stroke Prevention Some medical conditions and behaviors can lead to a higher chance of having a stroke. You can help prevent a stroke by eating healthy, exercising, not smoking, and managing any medical conditions you have. Stroke is a leading cause of functional impairment. Primary prevention is particularly important because a majority of strokes are first-time events. Stroke changes the lives of not only those who experience a stroke but also their family and other caregivers. How can this condition affect me? A stroke is a medical emergency and should be treated right away. A stroke can lead to brain damage and can sometimes be life-threatening. If a person gets medical treatment right away, there is a better chance of surviving and recovering from a stroke. What can increase my risk? The following medical conditions may increase your risk of a stroke: Cardiovascular disease. High blood pressure (hypertension). Diabetes. High cholesterol. Sickle cell disease. Blood clotting disorders (hypercoagulable state). Obesity. Sleep disorders (obstructive sleep apnea). Other risk factors include: Being  older than age 2. Having a history of blood clots, stroke, or mini-stroke (transient ischemic attack, TIA). Genetic factors, such as race, ethnicity, or a family history of stroke. Smoking cigarettes or using other tobacco products. Taking birth control pills, especially if you also use tobacco. Heavy use of alcohol or drugs, especially cocaine and methamphetamine. Physical inactivity. What actions can I take to prevent this? Manage your health conditions High cholesterol levels. Eating a healthy diet is important for preventing high cholesterol. If cholesterol cannot be managed through diet alone, you may need to take medicines. Take any prescribed medicines to control your cholesterol as told by your health care provider. Hypertension. To reduce your risk of stroke, try to keep your blood pressure below 130/80. Eating a healthy diet and exercising regularly are important for controlling blood pressure. If these steps are not enough to manage your blood pressure, you may need to take medicines. Take any prescribed medicines to control hypertension as told by your health care provider. Ask your health care provider if you should monitor your blood pressure at home. Have your blood pressure checked every year, even if your blood pressure is normal. Blood pressure increases with age and some medical conditions. Diabetes. Eating a healthy diet and exercising regularly are important parts of managing your blood sugar (glucose). If your blood sugar cannot be managed through diet and exercise, you may need to take medicines. Take any prescribed medicines to control your diabetes as told by your health care provider. Get evaluated for obstructive sleep apnea. Talk to your health care provider about getting a sleep evaluation if you snore a lot or have excessive sleepiness. Make sure that any other medical conditions you have, such as atrial fibrillation or atherosclerosis,  are  managed. Nutrition Follow instructions from your health care provider about what to eat or drink to help manage your health condition. These instructions may include: Reducing your daily calorie intake. Limiting how much salt (sodium) you use to 1,500 milligrams (mg) each day. Using only healthy fats for cooking, such as olive oil, canola oil, or sunflower oil. Eating healthy foods. You can do this by: Choosing foods that are high in fiber, such as whole grains, and fresh fruits and vegetables. Eating at least 5 servings of fruits and vegetables a day. Try to fill one-half of your plate with fruits and vegetables at each meal. Choosing lean protein foods, such as lean cuts of meat, poultry without skin, fish, tofu, beans, and nuts. Eating low-fat dairy products. Avoiding foods that are high in sodium. This can help lower blood pressure. Avoiding foods that have saturated fat, trans fat, and cholesterol. This can help prevent high cholesterol. Avoiding processed and prepared foods. Counting your daily carbohydrate intake.  Lifestyle If you drink alcohol: Limit how much you have to: 0-1 drink a day for women who are not pregnant. 0-2 drinks a day for men. Know how much alcohol is in your drink. In the [place] one drink equals one 12 oz bottle of beer (347m), one 5 oz glass of wine (1433m, or one 1 oz glass of hard liquor (4435m Do not use any products that contain nicotine or tobacco. These products include cigarettes, chewing tobacco, and vaping devices, such as e-cigarettes. If you need help quitting, ask your health care provider. Avoid secondhand smoke. Do not use drugs. Activity  Try to stay at a healthy weight. Get at least 30 minutes of exercise on most days, such as: Fast walking. Biking. Swimming. Medicines Take over-the-counter and prescription medicines only as told by your health care provider. Aspirin or blood thinners (antiplatelets or anticoagulants) may be recommended  to reduce your risk of forming blood clots that can lead to stroke. Avoid taking birth control pills. Talk to your health care provider about the risks of taking birth control pills if: You are over 35 13ars old. You smoke. You get very bad headaches. You have had a blood clot. Where to find more information American Stroke Association: www.strokeassociation.org Get help right away if: You or a loved one has any symptoms of a stroke. "BE FAST" is an easy way to remember the main warning signs of a stroke: B - Balance. Signs are dizziness, sudden trouble walking, or loss of balance. E - Eyes. Signs are trouble seeing or a sudden change in vision. F - Face. Signs are sudden weakness or numbness of the face, or the face or eyelid drooping on one side. A - Arms. Signs are weakness or numbness in an arm. This happens suddenly and usually on one side of the body. S - Speech. Signs are sudden trouble speaking, slurred speech, or trouble understanding what people say. T - Time. Time to call emergency services. Write down what time symptoms started. You or a loved one has other signs of a stroke, such as: A sudden, severe headache with no known cause. Nausea or vomiting. Seizure. These symptoms may represent a serious problem that is an emergency. Do not wait to see if the symptoms will go away. Get medical help right away. Call your local emergency services (911 in the [place] Do not drive yourself to the hospital. Summary You can help to prevent a stroke by eating healthy, exercising, not smoking, limiting alcohol intake,  and managing any medical conditions you may have. Do not use any products that contain nicotine or tobacco. These include cigarettes, chewing tobacco, and vaping devices, such as e-cigarettes. If you need help quitting, ask your health care provider. Remember "BE FAST" for warning signs of a stroke. Get help right away if you or a loved one has any of these signs. This information  is not intended to replace advice given to you by your health care provider. Make sure you discuss any questions you have with your health care provider. Document Revised: 04/24/2020 Document Reviewed: 04/24/2020 Elsevier Patient Education  [place].

## 2022-10-28 NOTE — Progress Notes (Signed)
[facility] [address] [place]. [place] [address] [phone]       OFFICE CONSULT NOTE  [name] Date of Birth:  1928-08-31 Medical Record Number:  [id number]   Referring MD:  Janine Ores, NP  Reason for Referral: Stroke  HPI: Sheila Jordan is a pleasant 89 year old Caucasian lady seen today for initial office consultation visit for stroke.  She is accompanied by her sister.  History is obtained from them and review of electronic medical records.  I have personally reviewed pertinent available imaging films in PACS.  Sheila Jordan is a 89 year old lady with past medical history of hypertension and arthritis who presented on 08/26/2022 with sudden onset of left hemiparesis upon arising from sleep at 4 AM.  Taken to [facility], ED with found to atrial fibrillation and felt to be outside time window for thrombolysis angiogram showed right M2 occlusion with a favorable penumbra she was transferred to [facility] she underwent thrombectomy consult.  She did well postprocedure..  Echocardiogram showed ejection fraction of 50 to 55% with grade 2 diastolic dysfunction.  LDL cholesterol 67 mg percent.  Hemoglobin A1c was 5.6.  She was started on Eliquis and amiodarone for heart rate.  Patient did well and has not needed.  She is currently living at 52 full neurological recovery.  Longstanding history of upper extremity tremors which appear to have gotten worse after the stroke.  The tremors are interfering with daily activities like holding a cup of coffee or newspaper.  She does not have a family history of similar tremors in her grand father.  She has not tried any specific medication for the tremor.  She is tolerating Eliquis well with minor bruising and no bleeding.  She states her blood pressure is better controlled at home but today it is elevated in our office at 156/86.  She was seen in the ER on 09/12/2022 for chest pain but had a essentially negative workup for PE  and cardiac sources.  ROS:   14 system review of systems is positive for tremor, decreased hearing, bruising all other systems negative  PMH:  Past Medical History:  Diagnosis Date   Arthritis    Hypertension     Social History:  Social History   Socioeconomic History   Marital status: Widowed    Spouse name: Not on file   Number of children: Not on file   Years of education: Not on file   Highest education level: Not on file  Occupational History   Not on file  Tobacco Use   Smoking status: Never   Smokeless tobacco: Never  Vaping Use   Vaping Use: Never used  Substance and Sexual Activity   Alcohol use: No   Drug use: No   Sexual activity: Not Currently    Birth control/protection: Post-menopausal  Other Topics Concern   Not on file  Social History Narrative   Not on file   Social Determinants of Health   Financial Resource Strain: Not on file  Food Insecurity: No Food Insecurity (08/27/2022)   Hunger Vital Sign    Worried About Running Out of Food in the Last Year: Never true    Ran Out of Food in the Last Year: Never true  Transportation Needs: No Transportation Needs (08/27/2022)   PRAPARE - Hydrologist (Medical): No    Lack of Transportation (Non-Medical): No  Physical Activity: Not on file  Stress: Not on file  Social Connections:  Not on file  Intimate Partner Violence: Not At Risk (08/27/2022)   Humiliation, Afraid, Rape, and Kick questionnaire    Fear of Current or Ex-Partner: No    Emotionally Abused: No    Physically Abused: No    Sexually Abused: No    Medications:   Current Outpatient Medications on File Prior to Visit  Medication Sig Dispense Refill   amiodarone (PACERONE) 200 MG tablet Take 1 tablet (200 mg total) by mouth daily. 90 tablet 3   apixaban (ELIQUIS) 5 MG TABS tablet Take 1 tablet (5 mg total) by mouth 2 (two) times daily. 60 tablet 1   aspirin EC 81 MG tablet Take 1 tablet (81 mg total) by mouth  daily. Swallow whole. 30 tablet 12   docusate sodium (COLACE) 100 MG capsule Take 100 mg by mouth daily as needed for mild constipation.     furosemide (LASIX) 20 MG tablet Take 2 tablets (40 mg total) by mouth daily. (Patient taking differently: Take 20 mg by mouth daily.) 60 tablet 3   metoprolol tartrate (LOPRESSOR) 25 MG tablet Take 1 tablet (25 mg total) by mouth 2 (two) times daily. 30 tablet 1   metoprolol tartrate (LOPRESSOR) 25 MG tablet Take 1 tablet (25 mg total) by mouth 2 (two) times daily. 180 tablet 4   pantoprazole (PROTONIX) 40 MG tablet Take 1 tablet (40 mg total) by mouth daily at 12 noon. 30 tablet 1   potassium chloride SA (KLOR-CON M) 20 MEQ tablet Take 1 tablet (20 mEq total) by mouth daily. 30 tablet 1   amiodarone (PACERONE) 200 MG tablet Take 1 tablet (200 mg total) by mouth daily. 90 tablet 4   amiodarone (PACERONE) 200 MG tablet Take 1 tablet (200 mg total) by mouth daily. (Patient not taking: Reported on 10/28/2022) 90 tablet 4   hydrALAZINE (APRESOLINE) 25 MG tablet Take 1 tablet (25 mg total) by mouth every 8 (eight) hours. (Patient not taking: Reported on 10/28/2022) 90 tablet 1   metoprolol tartrate (LOPRESSOR) 25 MG tablet Take 1 tablet (25 mg total) by mouth 2 (two) times daily. 180 tablet 4   Current Facility-Administered Medications on File Prior to Visit  Medication Dose Route Frequency Provider Last Rate Last Admin   clobetasol cream (TEMOVATE) 0.05 %   Topical BID Tilda Burrow, MD        Allergies:  No Known Allergies  Physical Exam General: Frail elderly pleasant Caucasian lady, seated, in no evident distress Head: head normocephalic and atraumatic.   Neck: supple with no carotid or supraclavicular bruits Cardiovascular: regular rate and rhythm, no murmurs Musculoskeletal: Marked stooped posture with kyphoscoliosis. Skin:  no rash/petichiae Vascular:  Normal pulses all extremities  Neurologic Exam Mental Status: Awake and fully alert. Oriented  to place and time. Recent and remote memory intact. Attention span, concentration and fund of knowledge appropriate. Mood and affect appropriate.  Cranial Nerves: Fundoscopic exam reveals sharp disc margins. Pupils equal, briskly reactive to light. Extraocular movements full without nystagmus.  Mild mechanical ptosis of right eye does not increase with sustained upgaze.  Visual fields full to confrontation. Hearing diminished bilaterally facial sensation intact. Face, tongue, palate moves normally and symmetrically.  Motor: Normal bulk and tone. Normal strength in all tested extremity muscles.  Mild resting hand tremor which increases with position holding and intention bilaterally.  No cogwheel rigidity. Sensory.: intact to touch , pinprick , position and vibratory sensation.  Coordination: Rapid alternating movements normal in all extremities. Finger-to-nose and heel-to-shin performed  accurately bilaterally. Gait and Station: Arises from chair without difficulty. Stance is normal. Gait demonstrates normal stride length and balance .  Not able to heel, toe and tandem walk .  Reflexes: 1+ and symmetric. Toes downgoing.   NIHSS  1 Modified Rankin  1   ASSESSMENT: 89 year old pleasant Caucasian lady with right MCA branch embolic infarcts from atrial fibrillation November 2023 [place] well with no residual deficits.  Vascular risk factors of hypertension, age and atrial fibrillation.  He also has longstanding tremors which appear to have worsened since starting amiodarone     PLAN:I had a long d/w patient and her sister about her recent stroke,tremors, risk for recurrent stroke/TIAs, personally independently reviewed imaging studies and stroke evaluation results and answered questions.Continue Eliquis (apixaban)  5 mg twice daily  for secondary stroke prevention and maintain strict control of hypertension with blood pressure goal below 130/90, diabetes with hemoglobin A1c goal below 6.5% and lipids  with LDL cholesterol goal below 70 mg/dL. I also advised the patient to eat a healthy diet with plenty of whole grains, cereals, fruits and vegetables, exercise regularly and maintain ideal body weight . Check TFTs, B 12, CMP and cbc and consider reducing amiodarone dose after d/w cardiologist to help with her tremors.Followup in the future with me in  3 months or call earlier if needed.  Greater than 50% time during this 45-minute consultation visit was spent on counseling and coordination of care about his embolic stroke and Sheila Contras, MD  Note: This document was prepared with digital dictation and possible smart phrase technology. Any transcriptional errors that result from this process are unintentional.

## 2022-10-29 ENCOUNTER — Other Ambulatory Visit (HOSPITAL_COMMUNITY): Payer: Self-pay

## 2022-10-29 DIAGNOSIS — E785 Hyperlipidemia, unspecified: Secondary | ICD-10-CM | POA: Diagnosis not present

## 2022-10-29 DIAGNOSIS — I129 Hypertensive chronic kidney disease with stage 1 through stage 4 chronic kidney disease, or unspecified chronic kidney disease: Secondary | ICD-10-CM | POA: Diagnosis not present

## 2022-10-29 DIAGNOSIS — I69341 Monoplegia of lower limb following cerebral infarction affecting right dominant side: Secondary | ICD-10-CM | POA: Diagnosis not present

## 2022-10-29 DIAGNOSIS — I69398 Other sequelae of cerebral infarction: Secondary | ICD-10-CM | POA: Diagnosis not present

## 2022-10-29 DIAGNOSIS — I69342 Monoplegia of lower limb following cerebral infarction affecting left dominant side: Secondary | ICD-10-CM | POA: Diagnosis not present

## 2022-10-29 DIAGNOSIS — Z7901 Long term (current) use of anticoagulants: Secondary | ICD-10-CM | POA: Diagnosis not present

## 2022-10-29 DIAGNOSIS — R2681 Unsteadiness on feet: Secondary | ICD-10-CM | POA: Diagnosis not present

## 2022-10-29 DIAGNOSIS — I4891 Unspecified atrial fibrillation: Secondary | ICD-10-CM | POA: Diagnosis not present

## 2022-10-29 DIAGNOSIS — N183 Chronic kidney disease, stage 3 unspecified: Secondary | ICD-10-CM | POA: Diagnosis not present

## 2022-10-29 MED ORDER — HYDRALAZINE HCL 25 MG PO TABS
25.0000 mg | ORAL_TABLET | Freq: Three times a day (TID) | ORAL | 12 refills | Status: DC
  Filled 2022-10-29 – 2022-10-30 (×2): qty 90, 30d supply, fill #0

## 2022-10-29 MED ORDER — PANTOPRAZOLE SODIUM 40 MG PO TBEC
40.0000 mg | DELAYED_RELEASE_TABLET | Freq: Every day | ORAL | 12 refills | Status: DC
  Filled 2022-10-29 – 2022-10-30 (×2): qty 30, 30d supply, fill #0
  Filled 2022-11-24: qty 30, 30d supply, fill #1
  Filled 2022-12-04 – 2022-12-19 (×2): qty 30, 30d supply, fill #2
  Filled 2023-01-23: qty 30, 30d supply, fill #3
  Filled 2023-02-17: qty 30, 30d supply, fill #4

## 2022-10-29 MED ORDER — POTASSIUM CHLORIDE ER 20 MEQ PO TBCR
1.0000 | EXTENDED_RELEASE_TABLET | Freq: Every day | ORAL | 12 refills | Status: DC
  Filled 2022-10-29 – 2022-10-30 (×2): qty 30, 30d supply, fill #0
  Filled 2022-11-24: qty 30, 30d supply, fill #1

## 2022-10-30 ENCOUNTER — Other Ambulatory Visit (HOSPITAL_COMMUNITY): Payer: Self-pay

## 2022-11-01 NOTE — Progress Notes (Signed)
Kindly inform the patient that CBC blood count is satisfactory.  Thyroid function shows slightly underactive thyroid and she needs to see her primary care physician for advice on thyroid replacement.

## 2022-11-03 LAB — CBC
Hematocrit: 36.5 % (ref 34.0–46.6)
Hemoglobin: 12 g/dL (ref 11.1–15.9)
MCH: 30.4 pg (ref 26.6–33.0)
MCHC: 32.9 g/dL (ref 31.5–35.7)
MCV: 92 fL (ref 79–97)
Platelets: 233 10*3/uL (ref 150–450)
RBC: 3.95 x10E6/uL (ref 3.77–5.28)
RDW: 14.1 % (ref 11.7–15.4)
WBC: 5.2 10*3/uL (ref 3.4–10.8)

## 2022-11-03 LAB — VITAMIN B2, WHOLE BLOOD: Vitamin B2, Whole Blood: 273 ug/L (ref 137–370)

## 2022-11-03 LAB — TSH+T3+FREE T4+T3 FREE
Free T-3: 2.2 pg/mL
Free T4 by Dialysis: 1.4 ng/dL
TSH: 6 uU/mL — ABNORMAL HIGH
Triiodothyronine (T-3), Serum: 69 ng/dL

## 2022-11-05 DIAGNOSIS — N1831 Chronic kidney disease, stage 3a: Secondary | ICD-10-CM | POA: Diagnosis not present

## 2022-11-05 DIAGNOSIS — I10 Essential (primary) hypertension: Secondary | ICD-10-CM | POA: Diagnosis not present

## 2022-11-07 ENCOUNTER — Other Ambulatory Visit (HOSPITAL_COMMUNITY): Payer: Self-pay

## 2022-11-14 ENCOUNTER — Other Ambulatory Visit: Payer: Self-pay

## 2022-11-14 ENCOUNTER — Encounter: Payer: Self-pay | Admitting: Internal Medicine

## 2022-11-14 ENCOUNTER — Other Ambulatory Visit (HOSPITAL_COMMUNITY)
Admission: RE | Admit: 2022-11-14 | Discharge: 2022-11-14 | Disposition: A | Discharge status: Home / Self Care | Payer: Medicare Other | Source: Ambulatory Visit | Attending: Internal Medicine | Admitting: Internal Medicine

## 2022-11-14 ENCOUNTER — Ambulatory Visit: Payer: Medicare Other | Attending: Internal Medicine | Admitting: Internal Medicine

## 2022-11-14 ENCOUNTER — Other Ambulatory Visit (HOSPITAL_COMMUNITY): Payer: Self-pay

## 2022-11-14 ENCOUNTER — Ambulatory Visit: Payer: Medicare Other | Admitting: Internal Medicine

## 2022-11-14 VITALS — BP 122/78 | HR 56 | Ht 64.0 in | Wt 156.0 lb

## 2022-11-14 DIAGNOSIS — I4891 Unspecified atrial fibrillation: Secondary | ICD-10-CM | POA: Diagnosis not present

## 2022-11-14 DIAGNOSIS — I5042 Chronic combined systolic (congestive) and diastolic (congestive) heart failure: Secondary | ICD-10-CM

## 2022-11-14 LAB — BASIC METABOLIC PANEL
Anion gap: 10 (ref 5–15)
BUN: 23 mg/dL (ref 8–23)
CO2: 24 mmol/L (ref 22–32)
Calcium: 8.5 mg/dL — ABNORMAL LOW (ref 8.9–10.3)
Chloride: 104 mmol/L (ref 98–111)
Creatinine, Ser: 1.42 mg/dL — ABNORMAL HIGH (ref 0.44–1.00)
GFR, Estimated: 35 mL/min — ABNORMAL LOW (ref >60)
Glucose, Bld: 104 mg/dL — ABNORMAL HIGH (ref 70–99)
Potassium: 4.4 mmol/L (ref 3.5–5.1)
Sodium: 138 mmol/L (ref 135–145)

## 2022-11-14 MED ORDER — HYDRALAZINE HCL 25 MG PO TABS
25.0000 mg | ORAL_TABLET | Freq: Two times a day (BID) | ORAL | 3 refills | Status: DC
  Filled 2022-11-14 – 2022-12-30 (×6): qty 180, 90d supply, fill #0
  Filled 2023-03-25 (×3): qty 180, 90d supply, fill #1
  Filled 2023-06-23: qty 180, 90d supply, fill #2
  Filled 2023-10-02: qty 180, 90d supply, fill #3

## 2022-11-14 MED ORDER — AMIODARONE HCL 200 MG PO TABS: 100.0000 mg | ORAL_TABLET | Freq: Every day | ORAL | 3 refills | Status: DC

## 2022-11-14 MED ORDER — HYDRALAZINE HCL 25 MG PO TABS: 25.0000 mg | ORAL_TABLET | Freq: Two times a day (BID) | ORAL | 3 refills | Status: DC

## 2022-11-14 MED ORDER — AMIODARONE HCL 200 MG PO TABS
100.0000 mg | ORAL_TABLET | Freq: Every day | ORAL | 3 refills | Status: DC
  Filled 2022-11-14 – 2022-12-27 (×5): qty 45, 90d supply, fill #0
  Filled 2023-02-17: qty 45, 90d supply, fill #1

## 2022-11-14 NOTE — Progress Notes (Signed)
Cardiology Office Note  Date: 11/14/2022   ID: Sheila Jordan, DOB 1928/05/16, MRN [id number]  PCP:  Asencion Noble, MD  Cardiologist:  Chalmers Guest, MD Electrophysiologist:  None   Reason for Office Visit: A-fib and diastolic heart failure evaluation   History of Present Illness: Sheila Jordan is a 89 y.o. 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  Patient was admitted to [facility] [id number]/[id number] with acute embolic CVA of R MCA status post thrombectomy and found to be in A-fib with RVR. She was on IV amiodarone and was switched to p.o. amiodarone upon discharge. She is currently on amiodarone 200 mg once daily, Toprol tartrate 25 mg twice daily and Eliquis 5 mg twice daily for atrial fibrillation management. She denies any symptoms of angina, DOE, syncope, palpitations, dizziness and leg swelling. No risk of falls. Although she did complain of constipation since 11/23, TSH was mildly elevated to 6 but free T3 and T4 were within normal limits.  She drinks 1 L of water every day and not more than that.  She eats healthy, includes vegetables in her diet.  Denies smoking cigarettes.   Past Medical History:  Diagnosis Date   Arthritis    Hypertension     Past Surgical History:  Procedure Laterality Date   APPENDECTOMY     CHOLECYSTECTOMY     IR CT HEAD LTD  08/26/2022   IR PERCUTANEOUS ART THROMBECTOMY/INFUSION INTRACRANIAL INC DIAG ANGIO  08/26/2022   IR US GUIDE VASC ACCESS RIGHT  08/26/2022   RADIOLOGY WITH ANESTHESIA N/A 08/26/2022   Procedure: RADIOLOGY WITH ANESTHESIA;  Surgeon: Radiologist, Medication, MD;  Location: [place];  Service: Radiology;  Laterality: N/A;   REVERSE SHOULDER ARTHROPLASTY Right 06/19/2020   Procedure: REVERSE TOTAL SHOULDER ARTHROPLASTY;  Surgeon: Hiram Gash, MD;  Location: [place];  Service:  Orthopedics;  Laterality: Right;    Current Outpatient Medications  Medication Sig Dispense Refill   amiodarone (PACERONE) 200 MG tablet Take 1 tablet (200 mg total) by mouth daily. 90 tablet 4   apixaban (ELIQUIS) 5 MG TABS tablet Take 1 tablet (5 mg total) by mouth 2 (two) times daily. 60 tablet 1   aspirin EC 81 MG tablet Take 1 tablet (81 mg total) by mouth daily. Swallow whole. 30 tablet 12   docusate sodium (COLACE) 100 MG capsule Take 100 mg by mouth daily as needed for mild constipation.     furosemide (LASIX) 20 MG tablet Take 2 tablets (40 mg total) by mouth daily. (Patient taking differently: Take 20 mg by mouth daily.) 60 tablet 3   hydrALAZINE (APRESOLINE) 25 MG tablet Take 1 tablet (25 mg total) by mouth every 8 (eight) hours. 90 tablet 1   hydrALAZINE (APRESOLINE) 25 MG tablet Take 1 tablet (25 mg total) by mouth 3 (three) times daily. 90 tablet 12   metoprolol tartrate (LOPRESSOR) 25 MG tablet Take 1 tablet (25 mg total) by mouth 2 (two) times daily. 30 tablet 1   metoprolol tartrate (LOPRESSOR) 25 MG tablet Take 1 tablet (25 mg total) by mouth 2 (two) times daily. 180 tablet 4   pantoprazole (PROTONIX) 40 MG tablet Take 1 tablet (40 mg total) by mouth daily at 12 noon. 30 tablet 1   pantoprazole (PROTONIX) 40 MG tablet Take 1 tablet (40 mg total) by mouth daily. 30 tablet 12  Potassium Chloride ER 20 MEQ TBCR Take 1 tablet (20 mEq total) by mouth daily. 30 tablet 12   potassium chloride SA (KLOR-CON M) 20 MEQ tablet Take 1 tablet (20 mEq total) by mouth daily. 30 tablet 1   metoprolol tartrate (LOPRESSOR) 25 MG tablet Take 1 tablet (25 mg total) by mouth 2 (two) times daily. 180 tablet 4   Current Facility-Administered Medications  Medication Dose Route Frequency Provider Last Rate Last Admin   clobetasol cream (TEMOVATE) 0.05 %   Topical BID Jonnie Kind, MD       Allergies:  Patient has no known allergies.   Social History: The patient  reports that she has never  smoked. She has never used smokeless tobacco. She reports that she does not drink alcohol and does not use drugs.   Family History: The patient's family history includes Breast cancer in her daughter; Other in her father; Parkinson's disease in her mother.   ROS:  Please see the history of present illness. Otherwise, complete review of systems is positive for none.  All other systems are reviewed and negative.   Physical Exam: VS:  BP 122/78   Pulse (!) 56   Ht 5\' 4"  (1.626 m)   Wt 156 lb (70.8 kg)   SpO2 97%   BMI 26.78 kg/m , BMI Body mass index is 26.78 kg/m.  Wt Readings from Last 3 Encounters:  11/14/22 156 lb (70.8 kg)  10/28/22 162 lb 9.6 oz (73.8 kg)  09/12/22 160 lb 15 oz (73 kg)    General: Patient appears comfortable at rest. HEENT: Conjunctiva and lids normal, oropharynx clear with moist mucosa. Neck: Supple, no elevated JVP or carotid bruits, no thyromegaly. Lungs: Clear to auscultation, nonlabored breathing at rest. Cardiac: Regular rate and rhythm, no S3 or significant systolic murmur, no pericardial rub. Abdomen: Soft, nontender, no hepatomegaly, bowel sounds present, no guarding or rebound. Extremities: No pitting edema, distal pulses 2+. Skin: Warm and dry. Musculoskeletal: No kyphosis. Neuropsychiatric: Alert and oriented x3, affect grossly appropriate.  ECG:  An ECG dated 11/14/2022 was personally reviewed today and demonstrated:  Normal sinus rhythm and no ST changes  Recent Labwork: 08/31/2022: B Natriuretic Peptide 879.0 09/01/2022: ALT 34; AST 27; Magnesium 1.8 09/12/2022: BUN 19; Creatinine, Ser 1.31; Potassium 3.6; Sodium 141 10/28/2022: Hemoglobin 12.0; Platelets 233     Component Value Date/Time   CHOL 139 08/27/2022 0343   TRIG 67 08/27/2022 0343   HDL 59 08/27/2022 0343   CHOLHDL 2.4 08/27/2022 0343   VLDL 13 08/27/2022 0343   LDLCALC 67 08/27/2022 0343    Other Studies Reviewed Today: Echocardiogram from 08/2022 LVEF 50 to 55% G2  DD Moderate PR  Assessment and Plan: Patient is a 89 year old F known to have HTN, paroxysmal A-fib, chronic diastolic HF, history of CVA (embolic stroke of R MCA s/p thrombectomy on 08/26/2022), HLD, CKD postoperative cardiology clinic for evaluation of A-fib.  # Paroxysmal atrial fibrillation, rate controlled (EKG in the clinic today showed normal sinus rhythm) -Patient had new onset of A-fib with RVR found on admission from 08/26/2022 due to CVA. She was on IV amiodarone that was switched to p.o. amiodarone upon discharge.  Currently she is on amiodarone 200 mg which I will decrease to 100 mg once daily due to new symptom of constipation since 11/23 and mildly elevated TSH of 6 (although T3 and T4 were within normal limits) patient also drinks only 1L of water daily. Plan is to stop amiodarone in the next  clinic visit. Continue metoprolol tartrate 25 mg twice daily. -Continue Eliquis 5 mg twice daily. No risk of falls. Obtain BMP in 3 months.  # HTN, controlled -Decrease the frequency of hydralazine 25 mg from 3 times daily to twice daily.  # Chronic diastolic HF, compensated -Continue Lasix 20 mg once daily  # Embolic CVA of R MCA s/p thrombectomy in 11/23 -Not on aspirin due to Eliquis  I have spent a total of 45 minutes with patient reviewing chart, EKGs, labs and examining patient as well as establishing an assessment and plan that was discussed with the patient.  > 50% of time was spent in direct patient care.     Medication Adjustments/Labs and Tests Ordered: Current medicines are reviewed at length with the patient today.  Concerns regarding medicines are outlined above.   Tests Ordered: Orders Placed This Encounter  Procedures   EKG 12-Lead    Medication Changes: No orders of the defined types were placed in this encounter.   Disposition:  Follow up  3 months  Signed, Gary Bultman Fidel Levy, MD, 11/14/2022 10:07 AM    [facility] at [facility] [address] [place], [place] [address]

## 2022-11-14 NOTE — Patient Instructions (Signed)
Medication Instructions:  Your physician has recommended you make the following change in your medication:  - Stop Aspirin  - Decrease Amiodarone to 100 mg tablets once daily - Decrease Hydralazine to 25 mg tablets twice daily    Labwork: BMET TSH Panel  Testing/Procedures: None  Follow-Up: Follow up with Dr. Dellia Cloud in 3 months.   Any Other Special Instructions Will Be Listed Below (If Applicable).     If you need a refill on your cardiac medications before your next appointment, please call your pharmacy.

## 2022-11-16 LAB — THYROID PANEL
Free Thyroxine Index: 2.8 (ref 1.2–4.9)
T3 Uptake Ratio: 32 % (ref 24–39)
T4, Total: 8.9 ug/dL (ref 4.5–12.0)

## 2022-11-18 ENCOUNTER — Other Ambulatory Visit: Payer: Self-pay

## 2022-11-18 ENCOUNTER — Telehealth: Payer: Self-pay

## 2022-11-18 ENCOUNTER — Other Ambulatory Visit (HOSPITAL_COMMUNITY): Payer: Self-pay

## 2022-11-18 NOTE — Telephone Encounter (Signed)
-----   Message from Chalmers Guest, MD sent at 11/18/2022  9:00 AM EST ----- Normal thyroid function tests. Serum creatinine is 1.4. Continue Eliquis 5 mg BID and repeat BMP in 3 months before the next clinic visit.

## 2022-11-18 NOTE — Telephone Encounter (Signed)
Patient notified via Mychart.

## 2022-11-21 ENCOUNTER — Other Ambulatory Visit (HOSPITAL_COMMUNITY): Payer: Self-pay

## 2022-11-25 ENCOUNTER — Other Ambulatory Visit: Payer: Self-pay

## 2022-11-27 ENCOUNTER — Other Ambulatory Visit (HOSPITAL_COMMUNITY): Payer: Self-pay

## 2022-12-04 ENCOUNTER — Other Ambulatory Visit (HOSPITAL_COMMUNITY): Payer: Self-pay

## 2022-12-05 ENCOUNTER — Other Ambulatory Visit: Payer: Self-pay | Admitting: *Deleted

## 2022-12-05 ENCOUNTER — Other Ambulatory Visit (HOSPITAL_COMMUNITY): Payer: Self-pay

## 2022-12-05 ENCOUNTER — Other Ambulatory Visit: Payer: Self-pay

## 2022-12-05 MED ORDER — APIXABAN 5 MG PO TABS
5.0000 mg | ORAL_TABLET | Freq: Two times a day (BID) | ORAL | 2 refills | Status: DC
  Filled 2022-12-05: qty 60, 30d supply, fill #0
  Filled 2023-01-10: qty 60, 30d supply, fill #1
  Filled 2023-02-04: qty 60, 30d supply, fill #2

## 2022-12-05 MED ORDER — ELIQUIS 5 MG PO TABS: 5.0000 mg | ORAL_TABLET | Freq: Two times a day (BID) | ORAL | 4 refills | Status: DC

## 2022-12-05 NOTE — Telephone Encounter (Signed)
Prescription refill request for Eliquis received. Indication: afib  Last office visit: Mall[place], 11/14/2022 Scr: 1.42, 11/14/2022 Age: 89 yo  Weight: 70.8 kg   Refill sent.

## 2022-12-09 ENCOUNTER — Other Ambulatory Visit: Payer: Self-pay

## 2022-12-09 NOTE — Patient Outreach (Signed)
First telephone outreach attempt to obtain mRS. No answer. Left message for returned call.  Philmore Pali [facility] Management Assistant [phone]

## 2022-12-10 ENCOUNTER — Other Ambulatory Visit: Payer: Self-pay

## 2022-12-10 NOTE — Patient Outreach (Signed)
Second telephone outreach attempt to obtain mRS. No answer. Left message for returned call.  Philmore Pali [facility] Management Assistant [phone]

## 2022-12-12 ENCOUNTER — Other Ambulatory Visit: Payer: Self-pay

## 2022-12-12 NOTE — Patient Outreach (Signed)
Telephone outreach to patient to obtain mRS was successfully completed. MRS= [facility] Management Assistant [phone]

## 2022-12-19 ENCOUNTER — Other Ambulatory Visit (HOSPITAL_COMMUNITY): Payer: Self-pay

## 2022-12-19 ENCOUNTER — Other Ambulatory Visit: Payer: Self-pay

## 2022-12-24 ENCOUNTER — Other Ambulatory Visit (HOSPITAL_COMMUNITY): Payer: Self-pay

## 2022-12-24 ENCOUNTER — Telehealth: Payer: Self-pay

## 2022-12-24 MED ORDER — POTASSIUM CHLORIDE ER 20 MEQ PO TBCR
1.0000 | EXTENDED_RELEASE_TABLET | Freq: Every day | ORAL | 5 refills | Status: DC
  Filled 2022-12-24: qty 30, 30d supply, fill #0
  Filled 2023-01-23: qty 30, 30d supply, fill #1
  Filled 2023-02-17: qty 30, 30d supply, fill #2
  Filled 2023-03-19: qty 30, 30d supply, fill #3
  Filled 2023-04-18: qty 30, 30d supply, fill #4
  Filled 2023-05-14: qty 30, 30d supply, fill #5

## 2022-12-24 MED ORDER — FUROSEMIDE 20 MG PO TABS
40.0000 mg | ORAL_TABLET | Freq: Every day | ORAL | 5 refills | Status: DC
  Filled 2022-12-24: qty 60, 30d supply, fill #0
  Filled 2023-01-19: qty 60, 30d supply, fill #1
  Filled 2023-02-18: qty 60, 30d supply, fill #2
  Filled 2023-03-19: qty 60, 30d supply, fill #3
  Filled 2023-04-15: qty 60, 30d supply, fill #4
  Filled 2023-05-15: qty 60, 30d supply, fill #5

## 2022-12-24 NOTE — Telephone Encounter (Signed)
Medication refill completed  

## 2022-12-25 DIAGNOSIS — N1831 Chronic kidney disease, stage 3a: Secondary | ICD-10-CM | POA: Diagnosis not present

## 2022-12-25 DIAGNOSIS — I10 Essential (primary) hypertension: Secondary | ICD-10-CM | POA: Diagnosis not present

## 2022-12-27 ENCOUNTER — Other Ambulatory Visit: Payer: Self-pay

## 2022-12-30 ENCOUNTER — Other Ambulatory Visit (HOSPITAL_COMMUNITY): Payer: Self-pay

## 2022-12-30 ENCOUNTER — Other Ambulatory Visit: Payer: Self-pay

## 2023-01-01 DIAGNOSIS — I10 Essential (primary) hypertension: Secondary | ICD-10-CM | POA: Diagnosis not present

## 2023-01-01 DIAGNOSIS — N1831 Chronic kidney disease, stage 3a: Secondary | ICD-10-CM | POA: Diagnosis not present

## 2023-01-07 DIAGNOSIS — M79675 Pain in left toe(s): Secondary | ICD-10-CM | POA: Diagnosis not present

## 2023-01-07 DIAGNOSIS — M79674 Pain in right toe(s): Secondary | ICD-10-CM | POA: Diagnosis not present

## 2023-01-07 DIAGNOSIS — B351 Tinea unguium: Secondary | ICD-10-CM | POA: Diagnosis not present

## 2023-01-10 ENCOUNTER — Other Ambulatory Visit: Payer: Self-pay

## 2023-01-20 ENCOUNTER — Other Ambulatory Visit: Payer: Self-pay

## 2023-01-23 ENCOUNTER — Other Ambulatory Visit (HOSPITAL_COMMUNITY): Payer: Self-pay

## 2023-02-03 ENCOUNTER — Other Ambulatory Visit (HOSPITAL_COMMUNITY): Payer: Self-pay

## 2023-02-04 ENCOUNTER — Other Ambulatory Visit (HOSPITAL_COMMUNITY): Payer: Self-pay

## 2023-02-04 ENCOUNTER — Other Ambulatory Visit: Payer: Self-pay

## 2023-02-12 ENCOUNTER — Other Ambulatory Visit (HOSPITAL_COMMUNITY): Payer: Self-pay

## 2023-02-17 ENCOUNTER — Other Ambulatory Visit: Payer: Self-pay

## 2023-02-17 ENCOUNTER — Other Ambulatory Visit (HOSPITAL_COMMUNITY): Payer: Self-pay

## 2023-02-18 ENCOUNTER — Other Ambulatory Visit (HOSPITAL_COMMUNITY): Payer: Self-pay

## 2023-02-20 ENCOUNTER — Ambulatory Visit: Payer: Medicare Other | Admitting: Internal Medicine

## 2023-02-20 ENCOUNTER — Other Ambulatory Visit (HOSPITAL_COMMUNITY): Payer: Self-pay

## 2023-02-20 ENCOUNTER — Encounter: Payer: Self-pay | Admitting: Internal Medicine

## 2023-02-20 ENCOUNTER — Ambulatory Visit: Payer: Medicare Other | Attending: Internal Medicine | Admitting: Internal Medicine

## 2023-02-20 VITALS — BP 122/64 | HR 58 | Ht 65.0 in | Wt 147.0 lb

## 2023-02-20 DIAGNOSIS — I480 Paroxysmal atrial fibrillation: Secondary | ICD-10-CM | POA: Diagnosis not present

## 2023-02-20 MED ORDER — METOPROLOL SUCCINATE ER 25 MG PO TB24
25.0000 mg | ORAL_TABLET | Freq: Every day | ORAL | 3 refills | Status: DC
  Filled 2023-02-20: qty 90, 90d supply, fill #0
  Filled 2023-05-16: qty 90, 90d supply, fill #1
  Filled 2023-07-21 – 2023-07-25 (×2): qty 90, 90d supply, fill #2
  Filled 2024-01-24 – 2024-01-26 (×2): qty 90, 90d supply, fill #3

## 2023-02-20 NOTE — Patient Instructions (Signed)
Medication Instructions:  Your physician has recommended you make the following change in your medication:   Stop Taking Amiodarone  Stop Taking Metoprolol Tartrate ( Lopressor)  Start Taking Metoprolol Succinate ( Toprol XL )  25 mg Daily   *If you need a refill on your cardiac medications before your next appointment, please call your pharmacy*   Lab Work: NONE   If you have labs (blood work) drawn today and your tests are completely normal, you will receive your results only by: MyChart Message (if you have MyChart) OR A paper copy in the mail If you have any lab test that is abnormal or we need to change your treatment, we will call you to review the results.   Testing/Procedures: NONE    Follow-Up: At [facility], you and your health needs are our priority.  As part of our continuing mission to provide you with exceptional heart care, we have created designated Provider Care Teams.  These Care Teams include your primary Cardiologist (physician) and Advanced Practice Providers (APPs -  Physician Assistants and Nurse Practitioners) who all work together to provide you with the care you need, when you need it.  We recommend signing up for the patient portal called "MyChart".  Sign up information is provided on this After Visit Summary.  MyChart is used to connect with patients for Virtual Visits (Telemedicine).  Patients are able to view lab/test results, encounter notes, upcoming appointments, etc.  Non-urgent messages can be sent to your provider as well.   To learn more about what you can do with MyChart, go to [email or web address].    Your next appointment:   6 month(s)  Provider:   Luane School, MD    Other Instructions Thank you for choosing [facility]!

## 2023-02-22 NOTE — Progress Notes (Signed)
Cardiology Office Note  Date: 02/22/2023   ID: Sheila Jordan, DOB [birth year], MRN [id number]  PCP:  Carylon Perches, MD  Cardiologist:  Marjo Bicker, MD Electrophysiologist:  None   Reason for Office Visit: A-fib and diastolic heart failure evaluation   History of Present Illness: Sheila Jordan is a 89 y.o. female known to have HTN, paroxysmal A-fib, history of embolic CVA of R MCA status post thrombectomy 11/23, chronic diastolic HF was referred to cardiology clinic for posthospitalization follow-up visit.  Patient was admitted to [facility] 08/2022 with acute embolic CVA of R MCA status post thrombectomy and found to be in A-fib with RVR. She was on IV amiodarone and was switched to p.o. amiodarone upon discharge. She is currently on amiodarone 200 mg once daily, Toprol tartrate 25 mg twice daily and Eliquis 5 mg twice daily for atrial fibrillation management. She denies any symptoms of angina, DOE, syncope, palpitations, dizziness and leg swelling. No risk of falls. Although she did complain of constipation since 11/23, TSH was mildly elevated to 6 but free T3 and T4 were within normal limits.  She drinks 1 L of water every day and not more than that.  She eats healthy, includes vegetables in her diet.  Denies smoking cigarettes.   Past Medical History:  Diagnosis Date   Arthritis    Hypertension     Past Surgical History:  Procedure Laterality Date   APPENDECTOMY     CHOLECYSTECTOMY     IR CT HEAD LTD  08/26/2022   IR PERCUTANEOUS ART THROMBECTOMY/INFUSION INTRACRANIAL INC DIAG ANGIO  08/26/2022   IR US GUIDE VASC ACCESS RIGHT  08/26/2022   RADIOLOGY WITH ANESTHESIA N/A 08/26/2022   Procedure: RADIOLOGY WITH ANESTHESIA;  Surgeon: Radiologist, Medication, MD;  Location: [facility];  Service: Radiology;  Laterality: N/A;   REVERSE SHOULDER ARTHROPLASTY Right 06/19/2020   Procedure: REVERSE TOTAL SHOULDER ARTHROPLASTY;  Surgeon: Bjorn Pippin, MD;  Location: [facility];  Service:  Orthopedics;  Laterality: Right;    Current Outpatient Medications  Medication Sig Dispense Refill   apixaban (ELIQUIS) 5 MG TABS tablet Take 1 tablet (5 mg total) by mouth 2 (two) times daily. 60 tablet 2   docusate sodium (COLACE) 100 MG capsule Take 100 mg by mouth daily as needed for mild constipation.     furosemide (LASIX) 20 MG tablet Take 2 tablets (40 mg total) by mouth daily. 60 tablet 5   hydrALAZINE (APRESOLINE) 25 MG tablet Take 1 tablet (25 mg total) by mouth in the morning and at bedtime. 180 tablet 3   metoprolol succinate (TOPROL XL) 25 MG 24 hr tablet Take 1 tablet (25 mg total) by mouth daily. 90 tablet 3   pantoprazole (PROTONIX) 40 MG tablet Take 1 tablet (40 mg total) by mouth daily at 12 noon. 30 tablet 1   Potassium Chloride ER 20 MEQ TBCR Take 1 tablet (20 mEq total) by mouth daily. 30 tablet 5   apixaban (ELIQUIS) 5 MG TABS tablet Take 1 tablet (5 mg total) by mouth 2 (two) times daily. 180 tablet 4   Current Facility-Administered Medications  Medication Dose Route Frequency Provider Last Rate Last Admin   clobetasol cream (TEMOVATE) 0.05 %   Topical BID Tilda Burrow, MD       Allergies:  Patient has no known allergies.   Social History: The patient  reports that she has never smoked. She has never used smokeless tobacco. She reports that she does not drink alcohol  and does not use drugs.   Family History: The patient's family history includes Breast cancer in her daughter; Other in her father; Parkinson's disease in her mother.   ROS:  Please see the history of present illness. Otherwise, complete review of systems is positive for none.  All other systems are reviewed and negative.   Physical Exam: VS:  BP 122/64   Pulse (!) 58   Ht 5\' 5"  (1.651 m)   Wt 147 lb (66.7 kg)   SpO2 98%   BMI 24.46 kg/m , BMI Body mass index is 24.46 kg/m.  Wt Readings from Last 3 Encounters:  02/20/23 147 lb (66.7 kg)  11/14/22 156 lb (70.8 kg)  10/28/22 162 lb 9.6 oz  (73.8 kg)    General: Patient appears comfortable at rest. HEENT: Conjunctiva and lids normal, oropharynx clear with moist mucosa. Neck: Supple, no elevated JVP or carotid bruits, no thyromegaly. Lungs: Clear to auscultation, nonlabored breathing at rest. Cardiac: Regular rate and rhythm, no S3 or significant systolic murmur, no pericardial rub. Abdomen: Soft, nontender, no hepatomegaly, bowel sounds present, no guarding or rebound. Extremities: No pitting edema, distal pulses 2+. Skin: Warm and dry. Musculoskeletal: No kyphosis. Neuropsychiatric: Alert and oriented x3, affect grossly appropriate.  ECG:  An ECG dated 11/14/2022 was personally reviewed today and demonstrated:  Normal sinus rhythm and no ST changes  Recent Labwork: 08/31/2022: B Natriuretic Peptide 879.0 09/01/2022: ALT 34; AST 27; Magnesium 1.8 10/28/2022: Hemoglobin 12.0; Platelets 233 11/14/2022: BUN 23; Creatinine, Ser 1.42; Potassium 4.4; Sodium 138     Component Value Date/Time   CHOL 139 08/27/2022 0343   TRIG 67 08/27/2022 0343   HDL 59 08/27/2022 0343   CHOLHDL 2.4 08/27/2022 0343   VLDL 13 08/27/2022 0343   LDLCALC 67 08/27/2022 0343    Other Studies Reviewed Today: Echocardiogram from 08/2022 LVEF 50 to 55% G2 DD Moderate PR  Assessment and Plan: Patient is a 89 year old F known to have HTN, paroxysmal A-fib, chronic diastolic HF, history of CVA (embolic stroke of R MCA s/p thrombectomy on 08/26/2022), HLD, CKD postoperative cardiology clinic for evaluation of A-fib.  # Paroxysmal atrial fibrillation, rate controlled (EKG in the clinic today showed normal sinus rhythm) -Patient had new onset of A-fib with RVR found on admission from 08/26/2022 due to CVA. She was on IV amiodarone that was switched to p.o. amiodarone upon discharge.  Currently she is on amiodarone 200 mg which I will decrease to 100 mg once daily due to new symptom of constipation since 11/23 and mildly elevated TSH of 6 (although T3 and  T4 were within normal limits) patient also drinks only 1L of water daily. Plan is to stop amiodarone in the next clinic visit. Continue metoprolol tartrate 25 mg twice daily. -Continue Eliquis 5 mg twice daily. No risk of falls. Obtain BMP in 3 months.  # HTN, controlled -Decrease the frequency of hydralazine 25 mg from 3 times daily to twice daily.  # Chronic diastolic HF, compensated -Continue Lasix 20 mg once daily  # Embolic CVA of R MCA s/p thrombectomy in 11/23 -Not on aspirin due to Eliquis  I have spent a total of 45 minutes with patient reviewing chart, EKGs, labs and examining patient as well as establishing an assessment and plan that was discussed with the patient.  > 50% of time was spent in direct patient care.     Medication Adjustments/Labs and Tests Ordered: Current medicines are reviewed at length with the patient today.  Concerns  regarding medicines are outlined above.   Tests Ordered: Orders Placed This Encounter  Procedures   EKG 12-Lead    Medication Changes: Meds ordered this encounter  Medications   metoprolol succinate (TOPROL XL) 25 MG 24 hr tablet    Sig: Take 1 tablet (25 mg total) by mouth daily.    Dispense:  90 tablet    Refill:  3    Disposition:  Follow up  3 months  Signed, Di Jasmer Verne Spurr, MD, 02/22/2023 7:52 PM    [facility] at [facility] [address] [place], [place] [address]

## 2023-02-24 ENCOUNTER — Other Ambulatory Visit (HOSPITAL_COMMUNITY): Payer: Self-pay

## 2023-02-24 ENCOUNTER — Telehealth: Payer: Self-pay | Admitting: *Deleted

## 2023-02-24 MED ORDER — APIXABAN 2.5 MG PO TABS
2.5000 mg | ORAL_TABLET | Freq: Two times a day (BID) | ORAL | 5 refills | Status: DC
  Filled 2023-02-24: qty 60, 30d supply, fill #0
  Filled 2023-03-19: qty 60, 30d supply, fill #1
  Filled 2023-04-20: qty 60, 30d supply, fill #2
  Filled 2023-05-20: qty 60, 30d supply, fill #3
  Filled 2023-06-15: qty 60, 30d supply, fill #4
  Filled 2023-07-15: qty 60, 30d supply, fill #5

## 2023-02-24 NOTE — Telephone Encounter (Addendum)
Called.  Informed of decrease in Eliquis dose due to age and recent Scr.  She verbalized understanding.  New RX sent to [facility].  ----- Message from Roseanne Reno, CMA sent at 02/24/2023  7:17 AM EDT ----- Regarding: FW: BMP  ----- Message ----- From: Marjo Bicker, MD Sent: 02/23/2023   6:35 AM EDT To: Roseanne Reno, CMA Subject: BMP                                            I reviewed the BMP from 12/2022 on [facility] that noted serum creatinine >1.5 (1.56). Eliquis dose will need to be decreased from 5 mg to 2.5 mg BID. Could you make that change and let patient know please.

## 2023-03-17 ENCOUNTER — Other Ambulatory Visit (HOSPITAL_COMMUNITY): Payer: Self-pay

## 2023-03-18 DIAGNOSIS — B351 Tinea unguium: Secondary | ICD-10-CM | POA: Diagnosis not present

## 2023-03-18 DIAGNOSIS — M79675 Pain in left toe(s): Secondary | ICD-10-CM | POA: Diagnosis not present

## 2023-03-18 DIAGNOSIS — M79674 Pain in right toe(s): Secondary | ICD-10-CM | POA: Diagnosis not present

## 2023-03-19 ENCOUNTER — Other Ambulatory Visit (HOSPITAL_COMMUNITY): Payer: Self-pay

## 2023-03-25 ENCOUNTER — Other Ambulatory Visit (HOSPITAL_COMMUNITY): Payer: Self-pay

## 2023-03-25 ENCOUNTER — Other Ambulatory Visit: Payer: Self-pay

## 2023-03-26 ENCOUNTER — Other Ambulatory Visit (HOSPITAL_COMMUNITY): Payer: Self-pay

## 2023-03-27 ENCOUNTER — Other Ambulatory Visit (HOSPITAL_COMMUNITY): Payer: Self-pay

## 2023-03-27 DIAGNOSIS — Z79899 Other long term (current) drug therapy: Secondary | ICD-10-CM | POA: Diagnosis not present

## 2023-03-27 DIAGNOSIS — I480 Paroxysmal atrial fibrillation: Secondary | ICD-10-CM | POA: Diagnosis not present

## 2023-03-27 DIAGNOSIS — I10 Essential (primary) hypertension: Secondary | ICD-10-CM | POA: Diagnosis not present

## 2023-03-27 DIAGNOSIS — N1832 Chronic kidney disease, stage 3b: Secondary | ICD-10-CM | POA: Diagnosis not present

## 2023-04-02 ENCOUNTER — Encounter: Payer: Self-pay | Admitting: Neurology

## 2023-04-02 ENCOUNTER — Ambulatory Visit (INDEPENDENT_AMBULATORY_CARE_PROVIDER_SITE_OTHER): Payer: Medicare Other | Admitting: Neurology

## 2023-04-02 VITALS — BP 139/64 | HR 67 | Ht 63.0 in | Wt 146.0 lb

## 2023-04-02 DIAGNOSIS — R251 Tremor, unspecified: Secondary | ICD-10-CM | POA: Diagnosis not present

## 2023-04-02 DIAGNOSIS — Z8673 Personal history of transient ischemic attack (TIA), and cerebral infarction without residual deficits: Secondary | ICD-10-CM | POA: Diagnosis not present

## 2023-04-02 NOTE — Progress Notes (Signed)
[facility] [address] [place]. [place] [address] [phone]       OFFICE FOLLOW-UP VISIT NOTE  [name] Date of Birth:  [birth year] Medical Record Number:  [id number]   Referring MD:  Elmer Picker, NP  Reason for Referral: Stroke  HPI: Initial visit 10/28/2022: Sheila Jordan is a pleasant 89 year old Caucasian lady seen today for initial office consultation visit for stroke.  She is accompanied by her sister.  History is obtained from them and review of electronic medical records.  I have personally reviewed pertinent available imaging films in PACS.  Sheila Jordan is a 89 year old lady with past medical history of hypertension and arthritis who presented on 08/26/2022 with sudden onset of left hemiparesis upon arising from sleep at 4 AM.  Taken to [facility], ED with found to atrial fibrillation and felt to be outside time window for thrombolysis angiogram showed right M2 occlusion with a favorable penumbra she was transferred to [facility] she underwent thrombectomy consult.  She did well postprocedure..  Echocardiogram showed ejection fraction of 50 to 55% with grade 2 diastolic dysfunction.  LDL cholesterol 67 mg percent.  Hemoglobin A1c was 5.6.  She was started on Eliquis and amiodarone for heart rate.  Patient did well and has not needed.  She is currently living at 52 full neurological recovery.  Longstanding history of upper extremity tremors which appear to have gotten worse after the stroke.  The tremors are interfering with daily activities like holding a cup of coffee or newspaper.  She does not have a family history of similar tremors in her grand father.  She has not tried any specific medication for the tremor.  She is tolerating Eliquis well with minor bruising and no bleeding.  She states her blood pressure is better controlled at home but today it is elevated in our office at 156/86.  She was seen in the ER on 09/12/2022 for chest pain but had a  essentially negative workup for PE and cardiac sources. Update 04/02/2023 ; she returns for follow-up after last visit 6 months ago.  She is accompanied by her daughter.  Patient states she is doing well.  She has had no recurrent stroke or TIA symptoms.  She remains on Eliquis but the dose has been reduced to 2.5 twice daily due to slight worsening of renal function.  BMP on 11/14/2022 showed creatinine of 1.42 otherwise was normal.  Lab work on 02/26/2023 showed TSH of 6.09 T3 and T4 were normal.  Vitamin B12 was low normal at 273.  By herself.  Significantly she does drive a little bit.  2 granddaughters live nearby.  She has not noticed improvement in tremors after amiodarone was stopped by the cardiologist.  He has good days and bad days the tremors are mostly not bothersome and not interfering with his day-to-day activity .she states that her blood pressure is well controlled.  She has no new complaints today.   ROS:   14 system review of systems is positive for tremor, decreased hearing, bruising all other systems negative  PMH:  Past Medical History:  Diagnosis Date   Arthritis    Hypertension     Social History:  Social History   Socioeconomic History   Marital status: Widowed    Spouse name: Not on file   Number of children: Not on file   Years of education: Not on file   Highest education level: Not on file  Occupational History   Not  on file  Tobacco Use   Smoking status: Never   Smokeless tobacco: Never  Vaping Use   Vaping Use: Never used  Substance and Sexual Activity   Alcohol use: No   Drug use: No   Sexual activity: Not Currently    Birth control/protection: Post-menopausal  Other Topics Concern   Not on file  Social History Narrative   Not on file   Social Determinants of Health   Financial Resource Strain: Not on file  Food Insecurity: No Food Insecurity (08/27/2022)   Hunger Vital Sign    Worried About Running Out of Food in the Last Year: Never true     Ran Out of Food in the Last Year: Never true  Transportation Needs: No Transportation Needs (08/27/2022)   PRAPARE - Administrator, Civil Service (Medical): No    Lack of Transportation (Non-Medical): No  Physical Activity: Not on file  Stress: Not on file  Social Connections: Not on file  Intimate Partner Violence: Not At Risk (08/27/2022)   Humiliation, Afraid, Rape, and Kick questionnaire    Fear of Current or Ex-Partner: No    Emotionally Abused: No    Physically Abused: No    Sexually Abused: No    Medications:   Current Outpatient Medications on File Prior to Visit  Medication Sig Dispense Refill   apixaban (ELIQUIS) 2.5 MG TABS tablet Take 1 tablet (2.5 mg total) by mouth 2 (two) times daily. 60 tablet 5   docusate sodium (COLACE) 100 MG capsule Take 100 mg by mouth daily as needed for mild constipation.     furosemide (LASIX) 20 MG tablet Take 2 tablets (40 mg total) by mouth daily. 60 tablet 5   hydrALAZINE (APRESOLINE) 25 MG tablet Take 1 tablet (25 mg total) by mouth in the morning and at bedtime. 180 tablet 3   metoprolol succinate (TOPROL XL) 25 MG 24 hr tablet Take 1 tablet (25 mg total) by mouth daily. 90 tablet 3   pantoprazole (PROTONIX) 40 MG tablet Take 1 tablet (40 mg total) by mouth daily at 12 noon. 30 tablet 1   Potassium Chloride ER 20 MEQ TBCR Take 1 tablet (20 mEq total) by mouth daily. 30 tablet 5   Current Facility-Administered Medications on File Prior to Visit  Medication Dose Route Frequency Provider Last Rate Last Admin   clobetasol cream (TEMOVATE) 0.05 %   Topical BID Tilda Burrow, MD        Allergies:  No Known Allergies  Physical Exam General: Frail elderly pleasant Caucasian lady, seated, in no evident distress Head: head normocephalic and atraumatic.   Neck: supple with no carotid or supraclavicular bruits Cardiovascular: regular rate and rhythm, no murmurs Musculoskeletal: Marked stooped posture with kyphoscoliosis. Skin:   no rash/petichiae Vascular:  Normal pulses all extremities  Neurologic Exam Mental Status: Awake and fully alert. Oriented to place and time. Recent and remote memory intact. Attention span, concentration and fund of knowledge appropriate. Mood and affect appropriate.  Cranial Nerves: Fundoscopic exam reveals sharp disc margins. Pupils equal, briskly reactive to light. Extraocular movements full without nystagmus.  Mild mechanical ptosis of right eye does not increase with sustained upgaze.  Visual fields full to confrontation. Hearing diminished bilaterally .facial sensation intact. Face, tongue, palate moves normally and symmetrically.  Motor: Normal bulk and tone. Normal strength in all tested extremity muscles.  Mild resting hand tremor which increases with position holding and intention bilaterally.  No cogwheel rigidity. Sensory.: intact to touch ,  pinprick , position and vibratory sensation.  Coordination: Rapid alternating movements normal in all extremities. Finger-to-nose and heel-to-shin performed accurately bilaterally. Gait and Station: Arises from chair without difficulty. Stance is normal. Gait demonstrates normal stride length and balance .  Not able to heel, toe and tandem walk .  Reflexes: 1+ and symmetric. Toes downgoing.      ASSESSMENT: 89 year old pleasant Caucasian lady with right MCA branch embolic infarcts from atrial fibrillation November 2023 she is doing well with no residual deficits.  Vascular risk factors of hypertension, age and atrial fibrillation.  She also has longstanding tremors which appear to have improved after discontinuing amiodarone     PLAN:I had a long d/w patient and her daughter about her recent stroke,tremors, risk for recurrent stroke/TIAs, personally independently reviewed imaging studies and stroke evaluation results and answered questions.Continue Eliquis (apixaban)  2.5 mg twice daily  for secondary stroke prevention and maintain strict control  of hypertension with blood pressure goal below 130/90, diabetes with hemoglobin A1c goal below 6.5% and lipids with LDL cholesterol goal below 70 mg/dL. I also advised the patient to eat a healthy diet with plenty of whole grains, cereals, fruits and vegetables, exercise regularly and maintain ideal body weight .  Her tremor seems to have improved off.  Amiodarone  and is  not functionally disabling requiring medication at this time.Followup in the future with me in 1 year or call earlier if needed.  Greater than 50% time during this 35-minute   visit was spent on counseling and coordination of care about his embolic stroke and Delia Heady, MD  Note: This document was prepared with digital dictation and possible smart phrase technology. Any transcriptional errors that result from this process are unintentional.

## 2023-04-02 NOTE — Patient Instructions (Signed)
I had a long d/w patient and her daughter about her recent stroke,tremors, risk for recurrent stroke/TIAs, personally independently reviewed imaging studies and stroke evaluation results and answered questions.Continue Eliquis (apixaban)  2.5 mg twice daily  for secondary stroke prevention and maintain strict control of hypertension with blood pressure goal below 130/90, diabetes with hemoglobin A1c goal below 6.5% and lipids with LDL cholesterol goal below 70 mg/dL. I also advised the patient to eat a healthy diet with plenty of whole grains, cereals, fruits and vegetables, exercise regularly and maintain ideal body weight .  Her tremor seems to have improved off.  MigraLam and not functionally disabling requiring medication at this time.Followup in the future with me in 1 year or call earlier if needed.

## 2023-04-03 ENCOUNTER — Other Ambulatory Visit (HOSPITAL_COMMUNITY): Payer: Self-pay

## 2023-04-03 DIAGNOSIS — D6869 Other thrombophilia: Secondary | ICD-10-CM | POA: Diagnosis not present

## 2023-04-03 DIAGNOSIS — I480 Paroxysmal atrial fibrillation: Secondary | ICD-10-CM | POA: Diagnosis not present

## 2023-04-03 DIAGNOSIS — I10 Essential (primary) hypertension: Secondary | ICD-10-CM | POA: Diagnosis not present

## 2023-04-03 MED ORDER — SIMETHICONE 180 MG PO CAPS: 360.0000 mg | ORAL_CAPSULE | Freq: Two times a day (BID) | ORAL | 3 refills | Status: DC | PRN

## 2023-04-08 ENCOUNTER — Other Ambulatory Visit (HOSPITAL_COMMUNITY): Payer: Self-pay

## 2023-04-08 DIAGNOSIS — M1712 Unilateral primary osteoarthritis, left knee: Secondary | ICD-10-CM | POA: Diagnosis not present

## 2023-04-09 ENCOUNTER — Other Ambulatory Visit (HOSPITAL_COMMUNITY): Payer: Self-pay

## 2023-04-11 ENCOUNTER — Other Ambulatory Visit (HOSPITAL_COMMUNITY): Payer: Self-pay

## 2023-04-14 ENCOUNTER — Ambulatory Visit: Payer: Medicare Other | Admitting: Neurology

## 2023-04-15 ENCOUNTER — Other Ambulatory Visit (HOSPITAL_COMMUNITY): Payer: Self-pay

## 2023-04-18 ENCOUNTER — Other Ambulatory Visit: Payer: Self-pay

## 2023-04-21 ENCOUNTER — Other Ambulatory Visit: Payer: Self-pay

## 2023-04-21 ENCOUNTER — Other Ambulatory Visit (HOSPITAL_COMMUNITY): Payer: Self-pay

## 2023-04-24 ENCOUNTER — Other Ambulatory Visit (HOSPITAL_COMMUNITY): Payer: Self-pay

## 2023-05-14 ENCOUNTER — Other Ambulatory Visit (HOSPITAL_COMMUNITY): Payer: Self-pay

## 2023-05-15 ENCOUNTER — Other Ambulatory Visit: Payer: Self-pay

## 2023-05-16 ENCOUNTER — Other Ambulatory Visit: Payer: Self-pay

## 2023-05-20 ENCOUNTER — Other Ambulatory Visit: Payer: Self-pay

## 2023-06-03 DIAGNOSIS — M79675 Pain in left toe(s): Secondary | ICD-10-CM | POA: Diagnosis not present

## 2023-06-03 DIAGNOSIS — B351 Tinea unguium: Secondary | ICD-10-CM | POA: Diagnosis not present

## 2023-06-03 DIAGNOSIS — M79674 Pain in right toe(s): Secondary | ICD-10-CM | POA: Diagnosis not present

## 2023-06-10 ENCOUNTER — Other Ambulatory Visit: Payer: Self-pay | Admitting: Internal Medicine

## 2023-06-10 ENCOUNTER — Other Ambulatory Visit (HOSPITAL_COMMUNITY): Payer: Self-pay

## 2023-06-10 MED ORDER — POTASSIUM CHLORIDE ER 20 MEQ PO TBCR
1.0000 | EXTENDED_RELEASE_TABLET | Freq: Every day | ORAL | 5 refills | Status: DC
  Filled 2023-06-10: qty 30, 30d supply, fill #0
  Filled 2023-07-12: qty 30, 30d supply, fill #1
  Filled 2023-08-08: qty 30, 30d supply, fill #2
  Filled 2023-09-07: qty 30, 30d supply, fill #3
  Filled 2023-10-07: qty 30, 30d supply, fill #4
  Filled 2023-11-02: qty 30, 30d supply, fill #5

## 2023-06-10 MED ORDER — FUROSEMIDE 20 MG PO TABS
40.0000 mg | ORAL_TABLET | Freq: Every day | ORAL | 3 refills | Status: DC
  Filled 2023-06-10: qty 60, 30d supply, fill #0
  Filled 2023-07-09: qty 60, 30d supply, fill #1
  Filled 2023-08-08: qty 60, 30d supply, fill #2
  Filled 2023-09-06: qty 60, 30d supply, fill #3

## 2023-06-11 DIAGNOSIS — Z79899 Other long term (current) drug therapy: Secondary | ICD-10-CM | POA: Diagnosis not present

## 2023-06-11 DIAGNOSIS — M899 Disorder of bone, unspecified: Secondary | ICD-10-CM | POA: Diagnosis not present

## 2023-06-11 DIAGNOSIS — N1831 Chronic kidney disease, stage 3a: Secondary | ICD-10-CM | POA: Diagnosis not present

## 2023-06-11 DIAGNOSIS — R609 Edema, unspecified: Secondary | ICD-10-CM | POA: Diagnosis not present

## 2023-06-15 ENCOUNTER — Other Ambulatory Visit (HOSPITAL_COMMUNITY): Payer: Self-pay

## 2023-06-17 DIAGNOSIS — I480 Paroxysmal atrial fibrillation: Secondary | ICD-10-CM | POA: Diagnosis not present

## 2023-06-17 DIAGNOSIS — I10 Essential (primary) hypertension: Secondary | ICD-10-CM | POA: Diagnosis not present

## 2023-06-17 DIAGNOSIS — Z23 Encounter for immunization: Secondary | ICD-10-CM | POA: Diagnosis not present

## 2023-06-17 DIAGNOSIS — N183 Chronic kidney disease, stage 3 unspecified: Secondary | ICD-10-CM | POA: Diagnosis not present

## 2023-06-23 ENCOUNTER — Other Ambulatory Visit: Payer: Self-pay

## 2023-07-09 ENCOUNTER — Other Ambulatory Visit (HOSPITAL_COMMUNITY): Payer: Self-pay

## 2023-07-12 ENCOUNTER — Other Ambulatory Visit (HOSPITAL_COMMUNITY): Payer: Self-pay

## 2023-07-15 ENCOUNTER — Other Ambulatory Visit: Payer: Self-pay

## 2023-07-21 ENCOUNTER — Other Ambulatory Visit: Payer: Self-pay

## 2023-07-25 ENCOUNTER — Other Ambulatory Visit (HOSPITAL_COMMUNITY): Payer: Self-pay

## 2023-08-08 ENCOUNTER — Other Ambulatory Visit: Payer: Self-pay

## 2023-08-11 ENCOUNTER — Other Ambulatory Visit (HOSPITAL_COMMUNITY): Payer: Self-pay

## 2023-08-11 ENCOUNTER — Other Ambulatory Visit: Payer: Self-pay | Admitting: Internal Medicine

## 2023-08-11 MED ORDER — APIXABAN 2.5 MG PO TABS
2.5000 mg | ORAL_TABLET | Freq: Two times a day (BID) | ORAL | 5 refills | Status: DC
  Filled 2023-08-11: qty 60, 30d supply, fill #0
  Filled 2023-09-09: qty 60, 30d supply, fill #1
  Filled 2023-10-23: qty 60, 30d supply, fill #2
  Filled 2023-11-18: qty 60, 30d supply, fill #3

## 2023-08-11 NOTE — Telephone Encounter (Signed)
Prescription refill request for Eliquis received. Indication: PAF Last office visit: 02/20/23  V Mallipeddi MD Scr: 1.26 on 06/11/23   1.56 on 03/27/23 Age: 89 Weight: 66.7kg  Eliquis dose was decreased to 2.5mg  twice daily at last OV due to age and elevated Scr.  Last Scr was below 1.5 but will continue current dose.

## 2023-08-14 DIAGNOSIS — M79675 Pain in left toe(s): Secondary | ICD-10-CM | POA: Diagnosis not present

## 2023-08-14 DIAGNOSIS — B351 Tinea unguium: Secondary | ICD-10-CM | POA: Diagnosis not present

## 2023-08-14 DIAGNOSIS — M79674 Pain in right toe(s): Secondary | ICD-10-CM | POA: Diagnosis not present

## 2023-08-26 DIAGNOSIS — H04123 Dry eye syndrome of bilateral lacrimal glands: Secondary | ICD-10-CM | POA: Diagnosis not present

## 2023-09-01 ENCOUNTER — Encounter: Payer: Self-pay | Admitting: Internal Medicine

## 2023-09-01 ENCOUNTER — Ambulatory Visit: Payer: Medicare Other | Attending: Internal Medicine | Admitting: Internal Medicine

## 2023-09-01 VITALS — BP 118/62 | HR 66 | Ht 64.0 in | Wt 145.4 lb

## 2023-09-01 DIAGNOSIS — I480 Paroxysmal atrial fibrillation: Secondary | ICD-10-CM

## 2023-09-01 NOTE — Patient Instructions (Signed)

## 2023-09-01 NOTE — Progress Notes (Signed)
Cardiology Office Note  Date: 09/01/2023   ID: Sheila Jordan, DOB 12-27-1927, MRN [id number]  PCP:  Carylon Perches, MD  Cardiologist:  Marjo Bicker, MD Electrophysiologist:  None    History of Present Illness: Sheila Jordan is a 89 y.o. 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  Patient was admitted to [facility] 08/2022 with acute embolic CVA of R MCA status post thrombectomy and found to be in A-fib with RVR. She was on IV amiodarone and was switched to p.o. amiodarone upon discharge that was later discontinued in the prior clinic visit.  She is here for follow-up visit.  No symptoms, overall doing great.  No DOE, angina, dizziness, syncope or palpitations.  No leg swelling.  No risk of falls.   Past Medical History:  Diagnosis Date   Arthritis    Hypertension     Past Surgical History:  Procedure Laterality Date   APPENDECTOMY     CHOLECYSTECTOMY     IR CT HEAD LTD  08/26/2022   IR PERCUTANEOUS ART THROMBECTOMY/INFUSION INTRACRANIAL INC DIAG ANGIO  08/26/2022   IR US GUIDE VASC ACCESS RIGHT  08/26/2022   RADIOLOGY WITH ANESTHESIA N/A 08/26/2022   Procedure: RADIOLOGY WITH ANESTHESIA;  Surgeon: Radiologist, Medication, MD;  Location: [facility] OR;  Service: Radiology;  Laterality: N/A;   REVERSE SHOULDER ARTHROPLASTY Right 06/19/2020   Procedure: REVERSE TOTAL SHOULDER ARTHROPLASTY;  Surgeon: Bjorn Pippin, MD;  Location: [facility] OR;  Service: Orthopedics;  Laterality: Right;    Current Outpatient Medications  Medication Sig Dispense Refill   apixaban (ELIQUIS) 2.5 MG TABS tablet Take 1 tablet (2.5 mg total) by mouth 2 (two) times daily. 60 tablet 5   furosemide (LASIX) 20 MG tablet Take 2 tablets (40 mg total) by mouth daily. 60 tablet 3   hydrALAZINE (APRESOLINE) 25 MG tablet Take 1 tablet (25 mg total) by mouth in the morning and at bedtime. 180 tablet 3    metoprolol tartrate (LOPRESSOR) 25 MG tablet Take 25 mg by mouth 2 (two) times daily.     Potassium Chloride ER 20 MEQ TBCR Take 1 tablet (20 mEq total) by mouth daily. 30 tablet 5   docusate sodium (COLACE) 100 MG capsule Take 100 mg by mouth daily as needed for mild constipation. (Patient not taking: Reported on 09/01/2023)     metoprolol succinate (TOPROL XL) 25 MG 24 hr tablet Take 1 tablet (25 mg total) by mouth daily. (Patient not taking: Reported on 09/01/2023) 90 tablet 3   pantoprazole (PROTONIX) 40 MG tablet Take 1 tablet (40 mg total) by mouth daily at 12 noon. (Patient not taking: Reported on 09/01/2023) 30 tablet 1   Simethicone (PHAZYME ULTRA STRENGTH) 180 MG CAPS Take 2 capsules (360 mg total) by mouth 2 (two) times daily as needed. (Patient not taking: Reported on 09/01/2023) 360 capsule 3   Current Facility-Administered Medications  Medication Dose Route Frequency Provider Last Rate Last Admin   clobetasol cream (TEMOVATE) 0.05 %   Topical BID Tilda Burrow, MD       Allergies:  Patient has no known allergies.   Social History: The patient  reports that she has never smoked. She has never used smokeless tobacco. She reports that she does not drink alcohol and does not use drugs.   Family History: The patient's family history includes Breast cancer in her daughter; Other in  her father; Parkinson's disease in her mother.   ROS:  Please see the history of present illness. Otherwise, complete review of systems is positive for none.  All other systems are reviewed and negative.   Physical Exam: VS:  BP 118/62 (BP Location: Right Arm)   Pulse 66   Ht 5\' 4"  (1.626 m)   Wt 145 lb 6.4 oz (66 kg)   SpO2 97%   BMI 24.96 kg/m , BMI Body mass index is 24.96 kg/m.  Wt Readings from Last 3 Encounters:  09/01/23 145 lb 6.4 oz (66 kg)  04/02/23 146 lb (66.2 kg)  02/20/23 147 lb (66.7 kg)    General: Patient appears comfortable at rest. HEENT: Conjunctiva and lids normal,  oropharynx clear with moist mucosa. Neck: Supple, no elevated JVP or carotid bruits, no thyromegaly. Lungs: Clear to auscultation, nonlabored breathing at rest. Cardiac: Regular rate and rhythm, no S3 or significant systolic murmur, no pericardial rub. Abdomen: Soft, nontender, no hepatomegaly, bowel sounds present, no guarding or rebound. Extremities: No pitting edema, distal pulses 2+. Skin: Warm and dry. Musculoskeletal: No kyphosis. Neuropsychiatric: Alert and oriented x3, affect grossly appropriate.  ECG:  An ECG dated 11/14/2022 was personally reviewed today and demonstrated:  Normal sinus rhythm and no ST changes .  EKG on 02/20/2023 showed NSR.  Recent Labwork: 10/28/2022: Hemoglobin 12.0; Platelets 233 11/14/2022: BUN 23; Creatinine, Ser 1.42; Potassium 4.4; Sodium 138     Component Value Date/Time   CHOL 139 08/27/2022 0343   TRIG 67 08/27/2022 0343   HDL 59 08/27/2022 0343   CHOLHDL 2.4 08/27/2022 0343   VLDL 13 08/27/2022 0343   LDLCALC 67 08/27/2022 0343    Other Studies Reviewed Today: Echocardiogram from 08/2022 LVEF 50 to 55% G2 DD Moderate PR  Assessment and Plan: Patient is a 89 year old F known to have HTN, paroxysmal A-fib, chronic diastolic HF, history of CVA (embolic stroke of R MCA s/p thrombectomy on 08/26/2022), HLD, CKD is here for follow-up visit.  # Paroxysmal atrial fibrillation, rate controlled (EKG in the clinic today showed normal sinus rhythm) -Patient had new onset of A-fib with RVR found on admission from 08/26/2022 due to CVA. She was on IV amiodarone that was switched to p.o. amiodarone upon discharge that was later discontinued in the prior clinic visit.  EKG today showed normal sinus rhythm.  Continue metoprolol tartrate 25 mg twice daily and Eliquis 2.5 mg twice daily (serum creatinine from 12/2022 was 1.56).  No risk of falls.  # HTN, controlled -Continue hydralazine 25 mg twice daily and metoprolol titrate 25 mg twice daily.  # Chronic  diastolic HF, compensated -Continue p.o. Lasix 40 mg once daily  # Embolic CVA of R MCA s/p thrombectomy in 11/23 -Not on aspirin due to Eliquis use   I have spent a total of 30 minutes reviewing prior notes, discussed about A-fib and management, reviewed medications and side effects.   Medication Adjustments/Labs and Tests Ordered: Current medicines are reviewed at length with the patient today.  Concerns regarding medicines are outlined above.   Tests Ordered: Orders Placed This Encounter  Procedures   EKG 12-Lead    Medication Changes: No orders of the defined types were placed in this encounter.   Disposition:  Follow up  1 year  Signed, Clerance Umland Verne Spurr, MD, 09/01/2023 8:44 AM    [facility] at [facility] [address] [place], [place] [address]

## 2023-09-06 ENCOUNTER — Other Ambulatory Visit (HOSPITAL_COMMUNITY): Payer: Self-pay

## 2023-09-07 ENCOUNTER — Other Ambulatory Visit (HOSPITAL_COMMUNITY): Payer: Self-pay

## 2023-09-09 ENCOUNTER — Other Ambulatory Visit (HOSPITAL_COMMUNITY): Payer: Self-pay

## 2023-09-18 ENCOUNTER — Other Ambulatory Visit: Payer: Self-pay

## 2023-09-18 ENCOUNTER — Other Ambulatory Visit (HOSPITAL_COMMUNITY): Payer: Self-pay

## 2023-09-18 DIAGNOSIS — I480 Paroxysmal atrial fibrillation: Secondary | ICD-10-CM | POA: Diagnosis not present

## 2023-09-18 DIAGNOSIS — I10 Essential (primary) hypertension: Secondary | ICD-10-CM | POA: Diagnosis not present

## 2023-09-18 DIAGNOSIS — N1832 Chronic kidney disease, stage 3b: Secondary | ICD-10-CM | POA: Diagnosis not present

## 2023-09-18 DIAGNOSIS — Z79899 Other long term (current) drug therapy: Secondary | ICD-10-CM | POA: Diagnosis not present

## 2023-09-18 MED ORDER — METOPROLOL SUCCINATE ER 25 MG PO TB24
25.0000 mg | ORAL_TABLET | Freq: Every day | ORAL | 5 refills | Status: DC
  Filled 2023-09-18: qty 90, 90d supply, fill #0
  Filled 2023-11-18: qty 90, 90d supply, fill #1

## 2023-09-22 ENCOUNTER — Other Ambulatory Visit (HOSPITAL_COMMUNITY): Payer: Self-pay

## 2023-09-23 DIAGNOSIS — I480 Paroxysmal atrial fibrillation: Secondary | ICD-10-CM | POA: Diagnosis not present

## 2023-09-23 DIAGNOSIS — I10 Essential (primary) hypertension: Secondary | ICD-10-CM | POA: Diagnosis not present

## 2023-09-23 DIAGNOSIS — N183 Chronic kidney disease, stage 3 unspecified: Secondary | ICD-10-CM | POA: Diagnosis not present

## 2023-09-23 DIAGNOSIS — Z23 Encounter for immunization: Secondary | ICD-10-CM | POA: Diagnosis not present

## 2023-10-02 ENCOUNTER — Other Ambulatory Visit (HOSPITAL_COMMUNITY): Payer: Self-pay

## 2023-10-02 ENCOUNTER — Other Ambulatory Visit: Payer: Self-pay

## 2023-10-02 ENCOUNTER — Other Ambulatory Visit: Payer: Self-pay | Admitting: Internal Medicine

## 2023-10-02 MED ORDER — FUROSEMIDE 20 MG PO TABS
40.0000 mg | ORAL_TABLET | Freq: Every day | ORAL | 3 refills | Status: DC
  Filled 2023-10-02: qty 90, 45d supply, fill #0
  Filled 2023-10-07 – 2023-11-18 (×2): qty 90, 45d supply, fill #1
  Filled 2024-01-02: qty 90, 45d supply, fill #2

## 2023-10-07 ENCOUNTER — Other Ambulatory Visit: Payer: Self-pay

## 2023-10-24 ENCOUNTER — Other Ambulatory Visit (HOSPITAL_COMMUNITY): Payer: Self-pay

## 2023-10-27 ENCOUNTER — Other Ambulatory Visit (HOSPITAL_COMMUNITY): Payer: Self-pay

## 2023-10-28 DIAGNOSIS — B351 Tinea unguium: Secondary | ICD-10-CM | POA: Diagnosis not present

## 2023-10-28 DIAGNOSIS — M79675 Pain in left toe(s): Secondary | ICD-10-CM | POA: Diagnosis not present

## 2023-10-28 DIAGNOSIS — M79674 Pain in right toe(s): Secondary | ICD-10-CM | POA: Diagnosis not present

## 2023-11-02 ENCOUNTER — Other Ambulatory Visit (HOSPITAL_COMMUNITY): Payer: Self-pay

## 2023-11-18 ENCOUNTER — Other Ambulatory Visit (HOSPITAL_COMMUNITY): Payer: Self-pay

## 2023-11-18 ENCOUNTER — Other Ambulatory Visit: Payer: Self-pay

## 2023-11-18 ENCOUNTER — Encounter: Payer: Self-pay | Admitting: Pharmacist

## 2023-11-19 ENCOUNTER — Other Ambulatory Visit (HOSPITAL_COMMUNITY): Payer: Self-pay

## 2023-11-20 ENCOUNTER — Other Ambulatory Visit: Payer: Self-pay

## 2023-12-01 ENCOUNTER — Other Ambulatory Visit: Payer: Self-pay | Admitting: Internal Medicine

## 2023-12-01 ENCOUNTER — Other Ambulatory Visit (HOSPITAL_COMMUNITY): Payer: Self-pay

## 2023-12-01 ENCOUNTER — Other Ambulatory Visit: Payer: Self-pay

## 2023-12-01 MED ORDER — POTASSIUM CHLORIDE ER 20 MEQ PO TBCR
1.0000 | EXTENDED_RELEASE_TABLET | Freq: Every day | ORAL | 6 refills | Status: DC
  Filled 2023-12-01: qty 30, 30d supply, fill #0
  Filled 2023-12-31: qty 30, 30d supply, fill #1
  Filled 2024-02-02 (×2): qty 30, 30d supply, fill #2
  Filled 2024-03-05: qty 30, 30d supply, fill #3
  Filled 2024-04-01: qty 30, 30d supply, fill #4
  Filled 2024-05-02: qty 30, 30d supply, fill #5
  Filled 2024-05-29: qty 30, 30d supply, fill #6

## 2023-12-17 DIAGNOSIS — I480 Paroxysmal atrial fibrillation: Secondary | ICD-10-CM | POA: Diagnosis not present

## 2023-12-17 DIAGNOSIS — N1832 Chronic kidney disease, stage 3b: Secondary | ICD-10-CM | POA: Diagnosis not present

## 2023-12-17 DIAGNOSIS — Z79899 Other long term (current) drug therapy: Secondary | ICD-10-CM | POA: Diagnosis not present

## 2023-12-17 DIAGNOSIS — I10 Essential (primary) hypertension: Secondary | ICD-10-CM | POA: Diagnosis not present

## 2023-12-24 ENCOUNTER — Other Ambulatory Visit: Payer: Self-pay | Admitting: Internal Medicine

## 2023-12-24 DIAGNOSIS — I10 Essential (primary) hypertension: Secondary | ICD-10-CM | POA: Diagnosis not present

## 2023-12-24 DIAGNOSIS — N183 Chronic kidney disease, stage 3 unspecified: Secondary | ICD-10-CM | POA: Diagnosis not present

## 2023-12-24 DIAGNOSIS — I480 Paroxysmal atrial fibrillation: Secondary | ICD-10-CM | POA: Diagnosis not present

## 2023-12-25 ENCOUNTER — Other Ambulatory Visit (HOSPITAL_COMMUNITY): Payer: Self-pay

## 2023-12-25 ENCOUNTER — Other Ambulatory Visit: Payer: Self-pay

## 2023-12-25 MED ORDER — HYDRALAZINE HCL 25 MG PO TABS
25.0000 mg | ORAL_TABLET | Freq: Two times a day (BID) | ORAL | 3 refills | Status: DC
  Filled 2023-12-25: qty 180, 90d supply, fill #0
  Filled 2024-03-27: qty 180, 90d supply, fill #1
  Filled 2024-06-28: qty 180, 90d supply, fill #2
  Filled 2024-09-23: qty 180, 90d supply, fill #3

## 2023-12-31 ENCOUNTER — Other Ambulatory Visit (HOSPITAL_COMMUNITY): Payer: Self-pay

## 2024-01-02 ENCOUNTER — Other Ambulatory Visit: Payer: Self-pay

## 2024-01-06 DIAGNOSIS — M79671 Pain in right foot: Secondary | ICD-10-CM | POA: Diagnosis not present

## 2024-01-13 DIAGNOSIS — S0003XA Contusion of scalp, initial encounter: Secondary | ICD-10-CM | POA: Diagnosis not present

## 2024-01-24 ENCOUNTER — Other Ambulatory Visit (HOSPITAL_COMMUNITY): Payer: Self-pay

## 2024-02-02 ENCOUNTER — Telehealth: Payer: Self-pay | Admitting: Internal Medicine

## 2024-02-02 ENCOUNTER — Other Ambulatory Visit (HOSPITAL_COMMUNITY): Payer: Self-pay

## 2024-02-02 NOTE — Telephone Encounter (Signed)
 Attempt to reach caller contact, son Michaeleen Adler, twice, recording says number is not in service [phone]  I reached Ms.Dsouza [phone] and had started conversation with her and she relayed that her sons's phone is having issues. Then our line was disconnected. I have called back to a busy signal.  I finally reached her and she tells me she fell 2 weeks ago and hit the side of her face.No LOC, saw her pcp and he told her she had a local bruise only around her eye.  Saturday nite she was awakened at 3 am with heart fluttering and weakness. This lasted over 7 hours and it has been gone since.He daughter took her VS and patient doesn't remember the numbers but says her BP was "great" and her HR was "fine"  Her question is, can she have her lopressor  increased.    I will forward to Dr.Mallipeddi for recommendations.

## 2024-02-02 NOTE — Telephone Encounter (Signed)
 Patient c/o Palpitations:  STAT if patient reporting lightheadedness, shortness of breath, or chest pain  How long have you had palpitations/irregular HR/ Afib? Are you having the symptoms now? Irregular heartbeat on and off x 2 weeks  Are you currently experiencing lightheadedness, SOB or CP? no  Do you have a history of afib (atrial fibrillation) or irregular heart rhythm? yes  Have you checked your BP or HR? (document readings if available): yes  Are you experiencing any other symptoms? Sob at times

## 2024-02-03 ENCOUNTER — Other Ambulatory Visit (HOSPITAL_COMMUNITY)
Admission: RE | Admit: 2024-02-03 | Discharge: 2024-02-03 | Disposition: A | Discharge status: Home / Self Care | Source: Ambulatory Visit | Attending: Physician Assistant | Admitting: Physician Assistant

## 2024-02-03 ENCOUNTER — Ambulatory Visit: Attending: Physician Assistant | Admitting: Physician Assistant

## 2024-02-03 ENCOUNTER — Encounter: Payer: Self-pay | Admitting: Physician Assistant

## 2024-02-03 ENCOUNTER — Other Ambulatory Visit (HOSPITAL_COMMUNITY): Payer: Self-pay

## 2024-02-03 VITALS — BP 120/60 | HR 81 | Ht 62.5 in | Wt 144.0 lb

## 2024-02-03 DIAGNOSIS — I10 Essential (primary) hypertension: Secondary | ICD-10-CM | POA: Diagnosis not present

## 2024-02-03 DIAGNOSIS — I63411 Cerebral infarction due to embolism of right middle cerebral artery: Secondary | ICD-10-CM

## 2024-02-03 DIAGNOSIS — R002 Palpitations: Secondary | ICD-10-CM | POA: Insufficient documentation

## 2024-02-03 DIAGNOSIS — I5032 Chronic diastolic (congestive) heart failure: Secondary | ICD-10-CM | POA: Insufficient documentation

## 2024-02-03 DIAGNOSIS — I480 Paroxysmal atrial fibrillation: Secondary | ICD-10-CM

## 2024-02-03 LAB — CBC
HCT: 37.8 % (ref 36.0–46.0)
Hemoglobin: 12 g/dL (ref 12.0–15.0)
MCH: 29.6 pg (ref 26.0–34.0)
MCHC: 31.7 g/dL (ref 30.0–36.0)
MCV: 93.3 fL (ref 80.0–100.0)
Platelets: 289 10*3/uL (ref 150–400)
RBC: 4.05 MIL/uL (ref 3.87–5.11)
RDW: 14.3 % (ref 11.5–15.5)
WBC: 5.4 10*3/uL (ref 4.0–10.5)
nRBC: 0 % (ref 0.0–0.2)

## 2024-02-03 LAB — BASIC METABOLIC PANEL WITH GFR
Anion gap: 8 (ref 5–15)
BUN: 23 mg/dL (ref 8–23)
CO2: 24 mmol/L (ref 22–32)
Calcium: 9 mg/dL (ref 8.9–10.3)
Chloride: 105 mmol/L (ref 98–111)
Creatinine, Ser: 1.18 mg/dL — ABNORMAL HIGH (ref 0.44–1.00)
GFR, Estimated: 43 mL/min — ABNORMAL LOW (ref >60)
Glucose, Bld: 96 mg/dL (ref 70–99)
Potassium: 4.1 mmol/L (ref 3.5–5.1)
Sodium: 137 mmol/L (ref 135–145)

## 2024-02-03 MED ORDER — FUROSEMIDE 20 MG PO TABS
20.0000 mg | ORAL_TABLET | Freq: Every day | ORAL | 11 refills | Status: AC
  Filled 2024-02-03: qty 45, 45d supply, fill #0
  Filled 2024-02-06: qty 45, 30d supply, fill #0
  Filled 2024-03-05: qty 45, 30d supply, fill #1
  Filled 2024-04-01: qty 45, 30d supply, fill #2
  Filled 2024-05-02: qty 45, 30d supply, fill #3
  Filled 2024-05-29: qty 45, 30d supply, fill #4
  Filled 2024-06-28: qty 45, 30d supply, fill #5
  Filled 2024-07-28: qty 45, 30d supply, fill #6
  Filled 2024-08-24: qty 45, 30d supply, fill #7
  Filled 2024-09-23: qty 45, 30d supply, fill #8
  Filled 2024-10-23: qty 45, 30d supply, fill #9

## 2024-02-03 MED ORDER — METOPROLOL SUCCINATE ER 25 MG PO TB24
37.5000 mg | ORAL_TABLET | Freq: Every day | ORAL | 11 refills | Status: AC
  Filled 2024-02-03 – 2024-02-06 (×2): qty 60, 40d supply, fill #0
  Filled 2024-03-05: qty 60, 30d supply, fill #0
  Filled 2024-04-01: qty 60, 30d supply, fill #1
  Filled 2024-05-02: qty 60, 30d supply, fill #2
  Filled 2024-05-29: qty 60, 30d supply, fill #3
  Filled 2024-06-28: qty 60, 30d supply, fill #4
  Filled 2024-07-28: qty 60, 30d supply, fill #5
  Filled 2024-08-24: qty 60, 30d supply, fill #6
  Filled 2024-09-23: qty 60, 30d supply, fill #7
  Filled 2024-10-23: qty 60, 30d supply, fill #8

## 2024-02-03 NOTE — Patient Instructions (Signed)
 Medication Instructions:   Increase Toprol  XL to 37.5 mg daily. May take and extra 1/2 tablet daily as needed for palpitations   Decrease Lasix  to 20 mg daily. May take an extra 20 mg tablet daily for weight gain of 2 lbs over night or 5 lbs in one week.   Please monitor blood pressure daily for 2 weeks.Have your daughter send in via MyChart or drop off at office.   *If you need a refill on your cardiac medications before your next appointment, please call your pharmacy*  Lab Work: Your physician recommends that you return for lab work today.   If you have labs (blood work) drawn today and your tests are completely normal, you will receive your results only by: MyChart Message (if you have MyChart) OR A paper copy in the mail If you have any lab test that is abnormal or we need to change your treatment, we will call you to review the results.  Testing/Procedures: NONE    Follow-Up: At [facility], you and your health needs are our priority.  As part of our continuing mission to provide you with exceptional heart care, our providers are all part of one team.  This team includes your primary Cardiologist (physician) and Advanced Practice Providers or APPs (Physician Assistants and Nurse Practitioners) who all work together to provide you with the care you need, when you need it.  Your next appointment:    As scheduled   Provider:   You may see Vishnu P Mallipeddi, MD or one of the following Advanced Practice Providers on your designated Care Team:   [place], PA-C  Scotesia Gilead, [place] Theotis Flake, [place]     We recommend signing up for the patient portal called "MyChart".  Sign up information is provided on this After Visit Summary.  MyChart is used to connect with patients for Virtual Visits (Telemedicine).  Patients are able to view lab/test results, encounter notes, upcoming appointments, etc.  Non-urgent messages can be sent to your provider as well.   To  learn more about what you can do with MyChart, go to [email or web address].   Other Instructions Thank you for choosing [facility]!

## 2024-02-03 NOTE — Progress Notes (Signed)
 Cardiology Office Note:  .   Date:  02/03/2024  ID:  Olean Berkshire, DOB [birth year], MRN [id number] PCP: Artemisa Bile, MD  [facility] Providers Cardiologist:  Lasalle Pointer, MD    History of Present Illness: .   Sheila Jordan is a 89 y.o. female  with history of HTN, paroxysmal A-fib, history of embolic CVA of R MCA status post thrombectomy 11/23, chronic diastolic HF.  Patient was admitted to [facility] 08/2022 with acute embolic CVA of R MCA status post thrombectomy and found to be in A-fib with RVR. She was on IV amiodarone  and was switched to p.o. amiodarone  upon discharge that was later discontinued in the prior clinic visit.  LOV 08/2023 in NSR.  Patient called in yesterday saying she woke up 3am Sat with palpitations lasting over 7 hrs. She says her heart was just fluttering. It stopped after taking her meds. Her insurance changed and eliquis  was going to cost her $300 so Dr. Hildy Lowers gave her samples to last her until June. She had a recent fall at church tripping on a rug and has bruises all over her face and a big knot above her left eye-she was checked by Dr. Hildy Lowers. 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    ROS:    Studies Reviewed: Aaron Aas    EKG Interpretation Date/Time:  Tuesday February 03 2024 13:18:06 EDT Ventricular Rate:  81 PR Interval:  178 QRS Duration:  80 QT Interval:  396 QTC Calculation: 460 R Axis:   96  Text Interpretation: Sinus rhythm with occasional Premature ventricular complexes Rightward axis Nonspecific ST and T wave abnormality When compared with ECG of 01-Sep-2023 08:35, Premature ventricular complexes are now Present Nonspecific T wave abnormality now evident in Anterolateral leads Confirmed by Theotis Flake [phone]) on 02/03/2024 1:20:08 PM    Prior CV Studies:      Risk Assessment/Calculations:    CHA2DS2-VASc Score = 7   This indicates a 11.2% annual risk of stroke. The  patient's score is based upon: CHF History: 1 HTN History: 1 Diabetes History: 0 Stroke History: 2 Vascular Disease History: 0 Age Score: 2 Gender Score: 1            Physical Exam:   VS:  BP 120/60   Pulse 81   Ht 5' 2.5" (1.588 m)   Wt 144 lb (65.3 kg)   SpO2 97%   BMI 25.92 kg/m    Wt Readings from Last 3 Encounters:  02/03/24 144 lb (65.3 kg)  09/01/23 145 lb 6.4 oz (66 kg)  04/02/23 146 lb (66.2 kg)    GEN: Well nourished, well developed in no acute distress, large hematoma above left eye  NECK: No JVD; No carotid bruits CARDIAC:  RRR, some skipping no murmurs, rubs, gallops RESPIRATORY:  Clear to auscultation without rales, wheezing or rhonchi  ABDOMEN: Soft, non-tender, non-distended EXTREMITIES:  trace right ankle edema; No deformity   ASSESSMENT AND PLAN: .     PAF with RVR on admission 08/2022 for CVA initially treated with amiodarone  but stopped as OP. Saturday night she awakened with palpitations lasting 7 hrs but none since. EKG today NSR with PVC/PAC's. Will increase toprol  xl 25 mg 1 1/2 tablets daily. If recurrent palpitations put a Zio on. Check bmet and cbc. Last Crt 1.4 and she's 65 kg so on appropriate dose eliquis  today  HTN-BP on low side initially but  came up on repeat. Decrease lasix  as discussed below.  Chronic diastolic CHF-BP on low side and she's well compensated. Will decrease lasix  20 mg once daily, can take extra 20 mg for weight gain 2-3 lbs overnight or 5 lbs in a week. Can decrease hydralazine  if needed.  Embolic CVA R MCA s/p thrombectomy 08/2022 on Eliquis .        Dispo: f/u in June as scheduled.  Signed, Theotis Flake, PA-C

## 2024-02-03 NOTE — Telephone Encounter (Signed)
 Patient accepted appointment today with [facility] today at 1:30 pm

## 2024-02-03 NOTE — Telephone Encounter (Signed)
 Son (Ron) called to follow-up on next steps regarding patient's issue with palpitations.

## 2024-02-06 ENCOUNTER — Other Ambulatory Visit (HOSPITAL_COMMUNITY): Payer: Self-pay

## 2024-02-10 ENCOUNTER — Other Ambulatory Visit (HOSPITAL_COMMUNITY): Payer: Self-pay

## 2024-03-05 ENCOUNTER — Other Ambulatory Visit: Payer: Self-pay

## 2024-03-05 ENCOUNTER — Other Ambulatory Visit (HOSPITAL_COMMUNITY): Payer: Self-pay

## 2024-03-12 ENCOUNTER — Ambulatory Visit: Payer: Self-pay | Admitting: Internal Medicine

## 2024-03-17 ENCOUNTER — Ambulatory Visit: Attending: Internal Medicine | Admitting: Internal Medicine

## 2024-03-17 ENCOUNTER — Other Ambulatory Visit (HOSPITAL_COMMUNITY)
Admission: RE | Admit: 2024-03-17 | Discharge: 2024-03-17 | Disposition: A | Discharge status: Home / Self Care | Source: Ambulatory Visit | Attending: Internal Medicine | Admitting: Internal Medicine

## 2024-03-17 ENCOUNTER — Telehealth: Payer: Self-pay

## 2024-03-17 ENCOUNTER — Encounter: Payer: Self-pay | Admitting: Pharmacist

## 2024-03-17 ENCOUNTER — Other Ambulatory Visit: Payer: Self-pay

## 2024-03-17 ENCOUNTER — Encounter: Payer: Self-pay | Admitting: Internal Medicine

## 2024-03-17 ENCOUNTER — Other Ambulatory Visit (HOSPITAL_COMMUNITY): Payer: Self-pay

## 2024-03-17 VITALS — BP 122/74 | HR 69 | Ht 65.0 in | Wt 143.4 lb

## 2024-03-17 DIAGNOSIS — I10 Essential (primary) hypertension: Secondary | ICD-10-CM | POA: Diagnosis not present

## 2024-03-17 DIAGNOSIS — Z5181 Encounter for therapeutic drug level monitoring: Secondary | ICD-10-CM

## 2024-03-17 DIAGNOSIS — Z79899 Other long term (current) drug therapy: Secondary | ICD-10-CM | POA: Diagnosis not present

## 2024-03-17 DIAGNOSIS — Z7901 Long term (current) use of anticoagulants: Secondary | ICD-10-CM | POA: Diagnosis not present

## 2024-03-17 LAB — TSH: TSH: 1.365 u[IU]/mL (ref 0.350–4.500)

## 2024-03-17 MED ORDER — AMIODARONE HCL 200 MG PO TABS
200.0000 mg | ORAL_TABLET | Freq: Two times a day (BID) | ORAL | 0 refills | Status: DC
  Filled 2024-03-17: qty 180, 90d supply, fill #0

## 2024-03-17 MED ORDER — APIXABAN 5 MG PO TABS
5.0000 mg | ORAL_TABLET | Freq: Two times a day (BID) | ORAL | 5 refills | Status: DC
  Filled 2024-03-17: qty 60, 30d supply, fill #0
  Filled 2024-04-17: qty 60, 30d supply, fill #1
  Filled 2024-05-16: qty 60, 30d supply, fill #2
  Filled 2024-06-15: qty 60, 30d supply, fill #3
  Filled 2024-07-14: qty 60, 30d supply, fill #4
  Filled 2024-09-13: qty 60, 30d supply, fill #5

## 2024-03-17 MED ORDER — AMIODARONE HCL 200 MG PO TABS
200.0000 mg | ORAL_TABLET | Freq: Every day | ORAL | 2 refills | Status: DC
  Filled 2024-03-17: qty 90, 90d supply, fill #0

## 2024-03-17 NOTE — Patient Instructions (Addendum)
 Medication Instructions:  Your physician has recommended you make the following change in your medication:  Increase Eliquis  from 2.5 mg to 5 mg twice daily  Start taking Amiodarone  200 mg twice daily for 90 days(3 months) and then on 4th month will reduce to 200 mg once daily   Continue taking all other medications as prescribed   Labwork: TSH to be completed today  BMET to be completed in one week at [facility] [place]  Testing/Procedures: Your physician has recommended that you have a pulmonary function test. Pulmonary Function Tests are a group of tests that measure how well air moves in and out of your lungs.   Follow-Up: Your physician recommends that you schedule a follow-up appointment in: 4 months   Any Other Special Instructions Will Be Listed Below (If Applicable).  If you need a refill on your cardiac medications before your next appointment, please call your pharmacy.

## 2024-03-17 NOTE — Telephone Encounter (Signed)
-----   Message from Lynett Sarah sent at 03/17/2024 10:54 AM EDT ----- Are we able to mail an application to her?

## 2024-03-17 NOTE — Telephone Encounter (Addendum)
 Clinic reached out about high cost on Eliquis  (over $400) Script is currently processing at [facility] for mail order for $47. Unsure if patient still needs PAP or not. Called daughter but no answer, mailing app out in case it's still needed. PAP: Patient assistance application for Eliquis  through [employer] ([employer] has been mailed to pt's home address on file. Provider portion of application will be faxed to provider's office if/when patient returns their portion of application.

## 2024-03-17 NOTE — Progress Notes (Signed)
 Cardiology Office Note  Date: 03/17/2024   ID: Sheila Jordan, DOB 1928/04/10, MRN [id number]  PCP:  Sheila Bile, MD  Cardiologist:  Sheila Pointer, MD Electrophysiologist:  None    History of Present Illness: Sheila Jordan is a 89 y.o. 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  Patient was admitted to [facility] 08/2022 with acute embolic CVA of R MCA status post thrombectomy and found to be in A-fib with RVR. She was on IV amiodarone  and was switched to p.o. amiodarone  upon discharge that was later discontinued in the prior clinic visit.  Patient is here for follow-up visit, accompanied by son.  She had 3 fatigue spells where she had palpitations, SOB and felt extremely tired.  The spells occurred in the last couple of months.  During third spell, she remembered to take the extra dose of metoprolol  which helped with palpitations.  Otherwise, outside of the spells, she is doing great.  Does not have chest pains or shortness of breath.  No risk of falls.  Currently being $400 monthly for Eliquis .  She is a Runner, broadcasting/film/video, IT sales professional, recently insurance was switched to [employer].   Past Medical History:  Diagnosis Date   Arthritis    Hypertension     Past Surgical History:  Procedure Laterality Date   APPENDECTOMY     CHOLECYSTECTOMY     IR CT HEAD LTD  08/26/2022   IR PERCUTANEOUS ART THROMBECTOMY/INFUSION INTRACRANIAL INC DIAG ANGIO  08/26/2022   IR US  GUIDE VASC ACCESS RIGHT  08/26/2022   RADIOLOGY WITH ANESTHESIA N/A 08/26/2022   Procedure: RADIOLOGY WITH ANESTHESIA;  Surgeon: Radiologist, Medication, MD;  Location: [facility] OR;  Service: Radiology;  Laterality: N/A;   REVERSE SHOULDER ARTHROPLASTY Right 06/19/2020   Procedure: REVERSE TOTAL SHOULDER ARTHROPLASTY;  Surgeon: Micheline Ahr, MD;  Location: [facility] OR;  Service: Orthopedics;  Laterality: Right;    Current  Outpatient Medications  Medication Sig Dispense Refill   apixaban  (ELIQUIS ) 2.5 MG TABS tablet Take 1 tablet (2.5 mg total) by mouth 2 (two) times daily. 60 tablet 5   docusate sodium  (COLACE) 100 MG capsule Take 100 mg by mouth daily as needed for mild constipation.     furosemide  (LASIX ) 20 MG tablet Take 1 tablet (20 mg total) by mouth daily. May take an extra 20 mg for weight gain of 2 lbs overnight or 5 lbs in a week. 45 tablet 11   hydrALAZINE  (APRESOLINE ) 25 MG tablet Take 1 tablet (25 mg total) by mouth in the morning and at bedtime. 180 tablet 3   metoprolol  succinate (TOPROL  XL) 25 MG 24 hr tablet Take 1.5 tablets (37.5 mg total) by mouth daily. May take an extra 1/2 tablet for palpitations 60 tablet 11   pantoprazole  (PROTONIX ) 40 MG tablet Take 1 tablet (40 mg total) by mouth daily at 12 noon. 30 tablet 1   Potassium Chloride  ER 20 MEQ TBCR Take 1 tablet (20 mEq total) by mouth daily. 30 tablet 6   Simethicone  (PHAZYME ULTRA STRENGTH) 180 MG CAPS Take 2 capsules (360 mg total) by mouth 2 (two) times daily as needed. 360 capsule 3   Current Facility-Administered Medications  Medication Dose Route Frequency Provider Last Rate Last Admin   clobetasol  cream (TEMOVATE ) 0.05 %   Topical BID Ferguson, John V, MD       Allergies:  Patient has  no known allergies.   Social History: The patient  reports that she has never smoked. She has never used smokeless tobacco. She reports that she does not drink alcohol and does not use drugs.   Family History: The patient's family history includes Breast cancer in her daughter; Other in her father; Parkinson's disease in her mother.   ROS:  Please see the history of present illness. Otherwise, complete review of systems is positive for none.  All other systems are reviewed and negative.   Physical Exam: VS:  BP 122/74   Pulse 69   Ht 5' 5 (1.651 m)   Wt 143 lb 6.4 oz (65 kg)   SpO2 98%   BMI 23.86 kg/m , BMI Body mass index is 23.86  kg/m.  Wt Readings from Last 3 Encounters:  03/17/24 143 lb 6.4 oz (65 kg)  02/03/24 144 lb (65.3 kg)  09/01/23 145 lb 6.4 oz (66 kg)    General: Patient appears comfortable at rest. HEENT: Conjunctiva and lids normal, oropharynx clear with moist mucosa. Neck: Supple, no elevated JVP or carotid bruits, no thyromegaly. Lungs: Clear to auscultation, nonlabored breathing at rest. Cardiac: Regular rate and rhythm, no S3 or significant systolic murmur, no pericardial rub. Abdomen: Soft, nontender, no hepatomegaly, bowel sounds present, no guarding or rebound. Extremities: No pitting edema, distal pulses 2+. Skin: Warm and dry. Musculoskeletal: No kyphosis. Neuropsychiatric: Alert and oriented x3, affect grossly appropriate.  ECG:  An ECG dated 11/14/2022 was personally reviewed today and demonstrated:  Normal sinus rhythm and no ST changes.  EKG on 02/20/2023 showed NSR.  Recent Labwork: 02/03/2024: BUN 23; Creatinine, Ser 1.18; Hemoglobin 12.0; Platelets 289; Potassium 4.1; Sodium 137     Component Value Date/Time   CHOL 139 08/27/2022 0343   TRIG 67 08/27/2022 0343   HDL 59 08/27/2022 0343   CHOLHDL 2.4 08/27/2022 0343   VLDL 13 08/27/2022 0343   LDLCALC 67 08/27/2022 0343    Other Studies Reviewed Today: Echocardiogram from 08/2022 LVEF 50 to 55% G2 DD Moderate PR  Assessment and Plan:  Paroxysmal A-fib: New onset in 2023 when she was admitted with stroke.  Asymptomatic until couple of months ago when she had intermittent fatigue spells with symptoms of palpitations, SOB lasting for hours.  Metoprolol  partially improved the symptoms.  Outside of the spells, she is asymptomatic.  EKG today showed NSR.  Continue metoprolol  succinate 37.5 mg once daily.  She was previously on amiodarone  and tolerated medication well.  Left atrium is severely dilated on her echocardiogram, start amiodarone  200 mg twice daily for 3 months followed by 200 mg once daily.  TSH normal 1 year ago, obtain TSH  now and PFTs now.  Serum creatinine has been fluctuating for the last 1 year, normalized 1 month ago.  Currently on Eliquis  2.5 mg twice daily, will increase to 5 mg twice daily.  Will refer to patient assistance program for Eliquis .  If she still has to pee a lot, okay to switch to Coumadin.  Return to follow-up in 4 months with BMP before the next clinic visit.  No risk of falls.  HTN, controlled: Continue current antihypertensives, p.o. Lasix  20 mg once daily, hydralazine  25 mg once daily.  Congestive heart failure, compensated: Continue p.o. Lasix  20 mg once daily.  Embolic CVA of R MCA s/p thrombectomy in 2023: Not on aspirin  due to Eliquis  use.   Medication Adjustments/Labs and Tests Ordered: Current medicines are reviewed at length with the patient today.  Concerns  regarding medicines are outlined above.   Tests Ordered: Orders Placed This Encounter  Procedures   EKG 12-Lead    Medication Changes: No orders of the defined types were placed in this encounter.   Disposition:  Follow up 4 months  Signed, Kester Stimpson Beauford Bounds, MD, 03/17/2024 9:23 AM    [facility] [address] [place], [place] [address]

## 2024-03-19 ENCOUNTER — Ambulatory Visit: Payer: Self-pay | Admitting: Internal Medicine

## 2024-03-22 ENCOUNTER — Telehealth: Payer: Self-pay | Admitting: Internal Medicine

## 2024-03-22 ENCOUNTER — Other Ambulatory Visit (HOSPITAL_COMMUNITY): Payer: Self-pay

## 2024-03-22 MED ORDER — PANTOPRAZOLE SODIUM 40 MG PO TBEC
40.0000 mg | DELAYED_RELEASE_TABLET | Freq: Every day | ORAL | 3 refills | Status: AC
  Filled 2024-03-22: qty 90, 90d supply, fill #0
  Filled 2024-06-15: qty 90, 90d supply, fill #1
  Filled 2024-09-13: qty 90, 90d supply, fill #2

## 2024-03-22 NOTE — Telephone Encounter (Signed)
*  STAT* If patient is at the pharmacy, call can be transferred to refill team.   1. Which medications need to be refilled? (please list name of each medication and dose if known) pantoprazole  (PROTONIX ) 40 MG tablet   2. Which pharmacy/location (including street and city if local pharmacy) is medication to be sent to? [place]   3. Do they need a 30 day or 90 day supply? 90

## 2024-03-22 NOTE — Telephone Encounter (Signed)
 RX sent to requested Pharmacy

## 2024-03-24 ENCOUNTER — Other Ambulatory Visit (HOSPITAL_COMMUNITY)
Admission: RE | Admit: 2024-03-24 | Discharge: 2024-03-24 | Disposition: A | Discharge status: Home / Self Care | Source: Ambulatory Visit | Attending: Internal Medicine | Admitting: Internal Medicine

## 2024-03-24 DIAGNOSIS — Z79899 Other long term (current) drug therapy: Secondary | ICD-10-CM | POA: Diagnosis not present

## 2024-03-24 DIAGNOSIS — Z5181 Encounter for therapeutic drug level monitoring: Secondary | ICD-10-CM | POA: Insufficient documentation

## 2024-03-24 LAB — BASIC METABOLIC PANEL WITH GFR
Anion gap: 10 (ref 5–15)
BUN: 18 mg/dL (ref 8–23)
CO2: 22 mmol/L (ref 22–32)
Calcium: 8.9 mg/dL (ref 8.9–10.3)
Chloride: 107 mmol/L (ref 98–111)
Creatinine, Ser: 1.35 mg/dL — ABNORMAL HIGH (ref 0.44–1.00)
GFR, Estimated: 36 mL/min — ABNORMAL LOW (ref >60)
Glucose, Bld: 108 mg/dL — ABNORMAL HIGH (ref 70–99)
Potassium: 4.5 mmol/L (ref 3.5–5.1)
Sodium: 139 mmol/L (ref 135–145)

## 2024-03-25 ENCOUNTER — Ambulatory Visit: Payer: Self-pay | Admitting: Internal Medicine

## 2024-03-26 NOTE — Telephone Encounter (Signed)
 Left message in regards to Lab Results and sent MyChart message. PCP copied

## 2024-03-26 NOTE — Telephone Encounter (Signed)
-----   Message from Vishnu P Mallipeddi sent at 03/25/2024  1:14 PM EDT ----- Serum creatinine less than 1.5.  Continue Eliquis  5 mg twice daily. ----- Message ----- From: Interface, Lab In [place] Sent: 03/24/2024  11:26 AM EDT To: Lasalle Pointer, MD

## 2024-03-27 ENCOUNTER — Other Ambulatory Visit (HOSPITAL_COMMUNITY): Payer: Self-pay

## 2024-03-30 ENCOUNTER — Ambulatory Visit (HOSPITAL_COMMUNITY)
Admission: RE | Admit: 2024-03-30 | Discharge: 2024-03-30 | Disposition: A | Discharge status: Home / Self Care | Source: Ambulatory Visit | Attending: Internal Medicine | Admitting: Internal Medicine

## 2024-03-30 DIAGNOSIS — Z5181 Encounter for therapeutic drug level monitoring: Secondary | ICD-10-CM | POA: Insufficient documentation

## 2024-03-30 DIAGNOSIS — Z79899 Other long term (current) drug therapy: Secondary | ICD-10-CM | POA: Diagnosis not present

## 2024-03-30 LAB — PULMONARY FUNCTION TEST
DL/VA % pred: 123 %
DL/VA: 4.82 ml/min/mmHg/L
DLCO unc % pred: 55 %
DLCO unc: 10.61 ml/min/mmHg
FEF 25-75 Post: 1.17 L/s
FEF 25-75 Pre: 0.95 L/s
FEF2575-%Change-Post: 23 %
FEF2575-%Pred-Post: 195 %
FEF2575-%Pred-Pre: 158 %
FEV1-%Change-Post: 6 %
FEV1-%Pred-Post: 82 %
FEV1-%Pred-Pre: 77 %
FEV1-Post: 1.09 L
FEV1-Pre: 1.03 L
FEV1FVC-%Change-Post: -1 %
FEV1FVC-%Pred-Pre: 113 %
FEV6-%Change-Post: 8 %
FEV6-%Pred-Post: 82 %
FEV6-%Pred-Pre: 76 %
FEV6-Post: 1.38 L
FEV6-Pre: 1.28 L
FEV6FVC-%Pred-Post: 108 %
FEV6FVC-%Pred-Pre: 108 %
FVC-%Change-Post: 8 %
FVC-%Pred-Post: 75 %
FVC-%Pred-Pre: 70 %
FVC-Post: 1.38 L
FVC-Pre: 1.28 L
Post FEV1/FVC ratio: 79 %
Post FEV6/FVC ratio: 100 %
Pre FEV1/FVC ratio: 80 %
Pre FEV6/FVC Ratio: 100 %
RV % pred: 89 %
RV: 2.34 L
TLC % pred: 75 %
TLC: 3.72 L

## 2024-03-30 MED ORDER — ALBUTEROL SULFATE (2.5 MG/3ML) 0.083% IN NEBU
2.5000 mg | INHALATION_SOLUTION | Freq: Once | RESPIRATORY_TRACT | Status: AC
  Administered 2024-03-30: 2.5 mg via RESPIRATORY_TRACT

## 2024-03-31 NOTE — Telephone Encounter (Signed)
 Patient picked up Eliquis  from Pharmacy, disregarding PAP encounter.

## 2024-04-01 ENCOUNTER — Ambulatory Visit (INDEPENDENT_AMBULATORY_CARE_PROVIDER_SITE_OTHER): Payer: Medicare Other | Admitting: Neurology

## 2024-04-01 ENCOUNTER — Other Ambulatory Visit (HOSPITAL_COMMUNITY): Payer: Self-pay

## 2024-04-01 ENCOUNTER — Encounter: Payer: Self-pay | Admitting: Neurology

## 2024-04-01 VITALS — BP 126/68 | HR 72 | Ht 64.0 in | Wt 142.4 lb

## 2024-04-01 DIAGNOSIS — R251 Tremor, unspecified: Secondary | ICD-10-CM

## 2024-04-01 DIAGNOSIS — Z8673 Personal history of transient ischemic attack (TIA), and cerebral infarction without residual deficits: Secondary | ICD-10-CM | POA: Diagnosis not present

## 2024-04-01 NOTE — Progress Notes (Signed)
 [facility] [address] [place]. [place] [address] [phone]       OFFICE FOLLOW-UP VISIT NOTE  [name] Date of Birth:  11/11/1927 Medical Record Number:  [id number]   Referring MD:  Jorene Last, NP  Reason for Referral: Stroke  HPI: Initial visit 10/28/2022: [name] is a pleasant 89 year old Caucasian lady seen today for initial office consultation visit for stroke.  She is accompanied by her sister.  History is obtained from them and review of electronic medical records.  I have personally reviewed pertinent available imaging films in PACS.  [name] is a 89 year old lady with past medical history of hypertension and arthritis who presented on 08/26/2022 with sudden onset of left hemiparesis upon arising from sleep at 4 AM.  [facility]aken to [facility] ED with found to atrial fibrillation and felt to be outside time window for thrombolysis angiogram showed right M2 occlusion with a favorable penumbra she was transferred to [facility] she underwent thrombectomy consult.  She did well postprocedure..  Echocardiogram showed ejection fraction of 50 to 55% with grade 2 diastolic dysfunction.  LDL cholesterol 67 mg percent.  Hemoglobin A1c was 5.6.  She was started on Eliquis  and amiodarone  for heart rate.  Patient did well and has not needed.  She is currently living at 52 full neurological recovery.  Longstanding history of upper extremity tremors which appear to have gotten worse after the stroke.  The tremors are interfering with daily activities like holding a cup of coffee or newspaper.  She does not have a family history of similar tremors in her grand father.  She has not tried any specific medication for the tremor.  She is tolerating Eliquis  well with minor bruising and no bleeding.  She states her blood pressure is better controlled at home but today it is elevated in our office at 156/86.  She was seen in the ER on 09/12/2022 for chest pain but had a  essentially negative workup for PE and cardiac sources. Update 04/02/2023 ; she returns for follow-up after last visit 6 months ago.  She is accompanied by her daughter.  Patient states she is doing well.  She has had no recurrent stroke or TIA symptoms.  She remains on Eliquis  but the dose has been reduced to 2.5 twice daily due to slight worsening of renal function.  BMP on 11/14/2022 showed creatinine of 1.42 otherwise was normal.  Lab work on 02/26/2023 showed TSH of 6.09 T3 and T4 were normal.  Vitamin B12 was low normal at 273.  By herself.  Significantly she does drive a little bit.  2 granddaughters live nearby.  She has not noticed improvement in tremors after amiodarone  was stopped by the cardiologist.  He has good days and bad days the tremors are mostly not bothersome and not interfering with his day-to-day activity .she states that her blood pressure is well controlled.  She has no new complaints today.  Update 04/02/2023 : She returns for follow-up after last visit exactly a year ago.  She is accompanied by her daughter.  She states she is doing well.  She has had no recurrent TIA or stroke symptoms.  She is tolerating Eliquis  well without bleeding and only minor bruising and her dose was increased back to 5 mg twice daily.  She did have a minor fall 3 months ago when she bruised her forehead and she blames this on tripping on the rug.  She is living alone she is  mostly independent in all actives of daily livings.  2 granddaughters who live nearby on a property provide support.  Patient does have life alert button.  She does have an appointment to see primary care physician tomorrow and will likely get follow-up profile checked.  She does have mild tremor in her hands but these are intermittent and at times the time does not interfere with activities of daily living.  She has no new complaints.  ROS:   14 system review of systems is positive for tremor, decreased hearing, bruising all other systems  negative  PMH:  Past Medical History:  Diagnosis Date   Arthritis    Hypertension     Social History:  Social History   Socioeconomic History   Marital status: Widowed    Spouse name: Not on file   Number of children: Not on file   Years of education: Not on file   Highest education level: Not on file  Occupational History   Not on file  Tobacco Use   Smoking status: Never   Smokeless tobacco: Never  Vaping Use   Vaping status: Never Used  Substance and Sexual Activity   Alcohol use: No   Drug use: No   Sexual activity: Not Currently    Birth control/protection: Post-menopausal  Other Topics Concern   Not on file  Social History Narrative   Retired Tourist information centre manager   Social Drivers of Health   Financial Resource Strain: Not on file  Food Insecurity: No Food Insecurity (08/27/2022)   Hunger Vital Sign    Worried About Running Out of Food in the Last Year: Never true    Ran Out of Food in the Last Year: Never true  Transportation Needs: No Transportation Needs (08/27/2022)   PRAPARE - Administrator, Civil Service (Medical): No    Lack of Transportation (Non-Medical): No  Physical Activity: Not on file  Stress: Not on file  Social Connections: Not on file  Intimate Partner Violence: Not At Risk (08/27/2022)   Humiliation, Afraid, Rape, and Kick questionnaire    Fear of Current or Ex-Partner: No    Emotionally Abused: No    Physically Abused: No    Sexually Abused: No    Medications:   Current Outpatient Medications on File Prior to Visit  Medication Sig Dispense Refill   amiodarone  (PACERONE ) 200 MG tablet Take 1 tablet (200 mg total) by mouth 2 (two) times daily. For 3 months, then reduce to 200 mg once daily 180 tablet 0   [START ON 06/16/2024] amiodarone  (PACERONE ) 200 MG tablet Take 1 tablet (200 mg total) by mouth daily. 90 tablet 2   apixaban  (ELIQUIS ) 5 MG TABS tablet Take 1 tablet (5 mg total) by mouth 2 (two) times daily. 60  tablet 5   chlorhexidine  (PERIDEX ) 0.12 % solution Use as directed 15 mLs in the mouth or throat 2 (two) times daily. Swish and spit twice daily     docusate sodium  (COLACE) 100 MG capsule Take 100 mg by mouth daily as needed for mild constipation.     furosemide  (LASIX ) 20 MG tablet Take 1 tablet (20 mg total) by mouth daily. May take an extra 20 mg for weight gain of 2 lbs overnight or 5 lbs in a week. 45 tablet 11   hydrALAZINE  (APRESOLINE ) 25 MG tablet Take 1 tablet (25 mg total) by mouth in the morning and at bedtime. 180 tablet 3   metoprolol  succinate (TOPROL  XL) 25 MG 24 hr  tablet Take 1.5 tablets (37.5 mg total) by mouth daily. May take an extra 1/2 tablet for palpitations 60 tablet 11   pantoprazole  (PROTONIX ) 40 MG tablet Take 1 tablet (40 mg total) by mouth daily at 12 noon. 90 tablet 3   Potassium Chloride  ER 20 MEQ TBCR Take 1 tablet (20 mEq total) by mouth daily. 30 tablet 6   Simethicone  (PHAZYME ULTRA STRENGTH) 180 MG CAPS Take 2 capsules (360 mg total) by mouth 2 (two) times daily as needed. 360 capsule 3   Current Facility-Administered Medications on File Prior to Visit  Medication Dose Route Frequency Provider Last Rate Last Admin   clobetasol  cream (TEMOVATE ) 0.05 %   Topical BID Sheila Jordan, Sheila V, MD        Allergies:  No Known Allergies  Physical Exam General: Frail elderly pleasant Caucasian lady, seated, in no evident distress Head: head normocephalic and atraumatic.   Neck: supple with no carotid or supraclavicular bruits Cardiovascular: regular rate and rhythm, no murmurs Musculoskeletal: Marked stooped posture with kyphoscoliosis. Skin:  no rash/petichiae Vascular:  Normal pulses all extremities  Neurologic Exam Mental Status: Awake and fully alert. Oriented to place and time. Recent and remote memory intact. Attention span, concentration and fund of knowledge appropriate. Mood and affect appropriate.  Cranial Nerves: Fundoscopic exam reveals sharp disc margins.  Pupils equal, briskly reactive to light. Extraocular movements full without nystagmus.  Mild mechanical ptosis of right eye does not increase with sustained upgaze.  Visual fields full to confrontation. Hearing diminished bilaterally .facial sensation intact. Face, tongue, palate moves normally and symmetrically.  Motor: Normal bulk and tone. Normal strength in all tested extremity muscles.  Mild resting hand tremor which increases with position holding and intention bilaterally.  No cogwheel rigidity. Sensory.: intact to touch , pinprick , position and vibratory sensation.  Coordination: Rapid alternating movements normal in all extremities. Finger-to-nose and heel-to-shin performed accurately bilaterally. Gait and Station: Arises from chair without difficulty. Stance is normal. Gait demonstrates normal stride length and balance .  Not able to heel, toe and tandem walk .  Reflexes: 1+ and symmetric. Toes downgoing.      ASSESSMENT: 89 year old pleasant Caucasian lady with right MCA branch embolic infarcts from atrial fibrillation November 2023 she is doing well with no residual deficits.  Vascular risk factors of hypertension, age and atrial fibrillation.  She also has longstanding tremors which appear to have improved after discontinuing amiodarone  and appear nondisabling     PLAN:I had a long d/w patient and her daughter about her remote stroke, hand tremors ,, risk for recurrent stroke/TIAs, personally independently reviewed imaging studies and stroke evaluation results and answered questions.Continue Eliquis  (apixaban )  5 mg twice daily  for secondary stroke prevention and maintain strict control of hypertension with blood pressure goal below 130/90, diabetes with hemoglobin A1c goal below 6.5% and lipids with LDL cholesterol goal below 70 mg/dL. I also advised the patient to eat a healthy diet with plenty of whole grains, cereals, fruits and vegetables, exercise regularly and maintain ideal body  weight .Check screening carotid ultrasound .  Her tremor seems   not functionally disabling requiring medication at this time.Followup in the future with me only as  needed.  Greater than 50% time during this 35-minute   visit was spent on counseling and coordination of care about his embolic stroke and Sheila Popp, MD  Note: This document was prepared with digital dictation and possible smart phrase technology. Any transcriptional errors that result from this process  are unintentional.

## 2024-04-01 NOTE — Patient Instructions (Signed)
 I had a long d/w patient and her daughter about her remote stroke, hand tremors ,, risk for recurrent stroke/TIAs, personally independently reviewed imaging studies and stroke evaluation results and answered questions.Continue Eliquis  (apixaban )  5 mg twice daily  for secondary stroke prevention and maintain strict control of hypertension with blood pressure goal below 130/90, diabetes with hemoglobin A1c goal below 6.5% and lipids with LDL cholesterol goal below 70 mg/dL. I also advised the patient to eat a healthy diet with plenty of whole grains, cereals, fruits and vegetables, exercise regularly and maintain ideal body weight .Check screening carotid ultrasound .  Her tremor seems   not functionally disabling requiring medication at this time.Followup in the future with me only as  needed

## 2024-04-02 ENCOUNTER — Other Ambulatory Visit: Payer: Self-pay | Admitting: Neurology

## 2024-04-02 ENCOUNTER — Encounter: Payer: Self-pay | Admitting: Neurology

## 2024-04-02 DIAGNOSIS — I10 Essential (primary) hypertension: Secondary | ICD-10-CM | POA: Diagnosis not present

## 2024-04-02 DIAGNOSIS — R251 Tremor, unspecified: Secondary | ICD-10-CM

## 2024-04-02 DIAGNOSIS — I480 Paroxysmal atrial fibrillation: Secondary | ICD-10-CM | POA: Diagnosis not present

## 2024-04-02 DIAGNOSIS — N1832 Chronic kidney disease, stage 3b: Secondary | ICD-10-CM | POA: Diagnosis not present

## 2024-04-02 DIAGNOSIS — Z8673 Personal history of transient ischemic attack (TIA), and cerebral infarction without residual deficits: Secondary | ICD-10-CM

## 2024-04-06 ENCOUNTER — Ambulatory Visit: Payer: Self-pay | Admitting: Internal Medicine

## 2024-04-06 DIAGNOSIS — M79674 Pain in right toe(s): Secondary | ICD-10-CM | POA: Diagnosis not present

## 2024-04-06 DIAGNOSIS — B351 Tinea unguium: Secondary | ICD-10-CM | POA: Diagnosis not present

## 2024-04-06 NOTE — Telephone Encounter (Signed)
-----   Message from Vishnu P Mallipeddi sent at 04/06/2024  9:44 AM EDT ----- Mild restriction, moderate to severe diffusion defect.  If patient experiences any severe shortness of breath in the next few months, will need to stop amiodarone . ----- Message ----- From: Interface, Lab In [place] Sent: 03/30/2024  12:04 PM EDT To: Vishnu P Mallipeddi, MD

## 2024-04-06 NOTE — Telephone Encounter (Signed)
 The patient has been notified of the result and verbalized understanding.  All questions (if any) were answered. Littie CHRISTELLA Croak, CMA 04/06/2024 4:37 PM

## 2024-04-12 ENCOUNTER — Other Ambulatory Visit: Payer: Self-pay

## 2024-04-12 ENCOUNTER — Telehealth: Payer: Self-pay | Admitting: Internal Medicine

## 2024-04-12 ENCOUNTER — Other Ambulatory Visit (HOSPITAL_COMMUNITY): Payer: Self-pay

## 2024-04-12 ENCOUNTER — Ambulatory Visit (HOSPITAL_COMMUNITY)
Admission: RE | Admit: 2024-04-12 | Discharge: 2024-04-12 | Disposition: A | Discharge status: Home / Self Care | Source: Ambulatory Visit | Attending: Neurology | Admitting: Neurology

## 2024-04-12 DIAGNOSIS — Z8673 Personal history of transient ischemic attack (TIA), and cerebral infarction without residual deficits: Secondary | ICD-10-CM | POA: Insufficient documentation

## 2024-04-12 MED ORDER — SOTALOL HCL 80 MG PO TABS
40.0000 mg | ORAL_TABLET | Freq: Two times a day (BID) | ORAL | 1 refills | Status: DC
  Filled 2024-04-12: qty 30, 30d supply, fill #0
  Filled 2024-05-16: qty 30, 30d supply, fill #1

## 2024-04-12 NOTE — Telephone Encounter (Signed)
 Patient states she experienced shortness of breath after taking the Amiodarone  200mg .  She stopped taking it on 04/10/24.  Please advise on next steps.

## 2024-04-12 NOTE — Telephone Encounter (Signed)
 Patient's son Ron informed and verbalized understanding of plan.

## 2024-04-17 ENCOUNTER — Other Ambulatory Visit (HOSPITAL_COMMUNITY): Payer: Self-pay

## 2024-04-26 ENCOUNTER — Ambulatory Visit (HOSPITAL_COMMUNITY)
Admission: RE | Admit: 2024-04-26 | Discharge: 2024-04-26 | Disposition: A | Discharge status: Home / Self Care | Source: Ambulatory Visit | Attending: Internal Medicine | Admitting: Internal Medicine

## 2024-04-26 ENCOUNTER — Other Ambulatory Visit (HOSPITAL_COMMUNITY): Payer: Self-pay | Admitting: Internal Medicine

## 2024-04-26 DIAGNOSIS — Z7901 Long term (current) use of anticoagulants: Secondary | ICD-10-CM | POA: Insufficient documentation

## 2024-04-26 DIAGNOSIS — R519 Headache, unspecified: Secondary | ICD-10-CM | POA: Insufficient documentation

## 2024-04-26 DIAGNOSIS — I6782 Cerebral ischemia: Secondary | ICD-10-CM | POA: Diagnosis not present

## 2024-04-26 DIAGNOSIS — R51 Headache with orthostatic component, not elsewhere classified: Secondary | ICD-10-CM | POA: Diagnosis not present

## 2024-04-26 DIAGNOSIS — I672 Cerebral atherosclerosis: Secondary | ICD-10-CM | POA: Diagnosis not present

## 2024-04-29 ENCOUNTER — Encounter (HOSPITAL_COMMUNITY)

## 2024-04-30 DIAGNOSIS — I776 Arteritis, unspecified: Secondary | ICD-10-CM | POA: Diagnosis not present

## 2024-04-30 DIAGNOSIS — R519 Headache, unspecified: Secondary | ICD-10-CM | POA: Diagnosis not present

## 2024-04-30 DIAGNOSIS — Z79899 Other long term (current) drug therapy: Secondary | ICD-10-CM | POA: Diagnosis not present

## 2024-05-03 ENCOUNTER — Other Ambulatory Visit (HOSPITAL_COMMUNITY): Payer: Self-pay

## 2024-05-04 ENCOUNTER — Other Ambulatory Visit (HOSPITAL_BASED_OUTPATIENT_CLINIC_OR_DEPARTMENT_OTHER): Payer: Self-pay

## 2024-05-04 ENCOUNTER — Encounter (HOSPITAL_BASED_OUTPATIENT_CLINIC_OR_DEPARTMENT_OTHER): Payer: Self-pay

## 2024-05-04 ENCOUNTER — Emergency Department (HOSPITAL_BASED_OUTPATIENT_CLINIC_OR_DEPARTMENT_OTHER)

## 2024-05-04 ENCOUNTER — Other Ambulatory Visit: Payer: Self-pay

## 2024-05-04 ENCOUNTER — Emergency Department (HOSPITAL_BASED_OUTPATIENT_CLINIC_OR_DEPARTMENT_OTHER)
Admission: EM | Admit: 2024-05-04 | Discharge: 2024-05-04 | Disposition: A | Discharge status: Home / Self Care | Attending: Emergency Medicine | Admitting: Emergency Medicine

## 2024-05-04 DIAGNOSIS — R519 Headache, unspecified: Secondary | ICD-10-CM | POA: Diagnosis not present

## 2024-05-04 DIAGNOSIS — I110 Hypertensive heart disease with heart failure: Secondary | ICD-10-CM | POA: Diagnosis not present

## 2024-05-04 DIAGNOSIS — I6523 Occlusion and stenosis of bilateral carotid arteries: Secondary | ICD-10-CM | POA: Diagnosis not present

## 2024-05-04 DIAGNOSIS — Z7901 Long term (current) use of anticoagulants: Secondary | ICD-10-CM | POA: Diagnosis not present

## 2024-05-04 DIAGNOSIS — R9082 White matter disease, unspecified: Secondary | ICD-10-CM | POA: Diagnosis not present

## 2024-05-04 DIAGNOSIS — R9089 Other abnormal findings on diagnostic imaging of central nervous system: Secondary | ICD-10-CM | POA: Diagnosis not present

## 2024-05-04 DIAGNOSIS — Z79899 Other long term (current) drug therapy: Secondary | ICD-10-CM | POA: Insufficient documentation

## 2024-05-04 DIAGNOSIS — I5043 Acute on chronic combined systolic (congestive) and diastolic (congestive) heart failure: Secondary | ICD-10-CM | POA: Diagnosis not present

## 2024-05-04 DIAGNOSIS — I672 Cerebral atherosclerosis: Secondary | ICD-10-CM | POA: Diagnosis not present

## 2024-05-04 DIAGNOSIS — I10 Essential (primary) hypertension: Secondary | ICD-10-CM | POA: Diagnosis not present

## 2024-05-04 LAB — CBC WITH DIFFERENTIAL/PLATELET
Abs Immature Granulocytes: 0 K/uL (ref 0.00–0.07)
Basophils Absolute: 0 K/uL (ref 0.0–0.1)
Basophils Relative: 1 %
Eosinophils Absolute: 0.1 K/uL (ref 0.0–0.5)
Eosinophils Relative: 2 %
HCT: 38.7 % (ref 36.0–46.0)
Hemoglobin: 12.4 g/dL (ref 12.0–15.0)
Immature Granulocytes: 0 %
Lymphocytes Relative: 29 %
Lymphs Abs: 1.6 K/uL (ref 0.7–4.0)
MCH: 29.5 pg (ref 26.0–34.0)
MCHC: 32 g/dL (ref 30.0–36.0)
MCV: 91.9 fL (ref 80.0–100.0)
Monocytes Absolute: 0.1 K/uL (ref 0.1–1.0)
Monocytes Relative: 6 %
Neutro Abs: 3.3 K/uL (ref 1.7–7.7)
Neutrophils Relative %: 62 %
Platelets: 240 K/uL (ref 150–400)
RBC: 4.21 MIL/uL (ref 3.87–5.11)
RDW: 14 % (ref 11.5–15.5)
WBC: 5.3 K/uL (ref 4.0–10.5)
nRBC: 0 % (ref 0.0–0.2)

## 2024-05-04 LAB — COMPREHENSIVE METABOLIC PANEL WITH GFR
ALT: 9 U/L (ref 0–44)
AST: 19 U/L (ref 15–41)
Albumin: 4.3 g/dL (ref 3.5–5.0)
Alkaline Phosphatase: 90 U/L (ref 38–126)
Anion gap: 12 (ref 5–15)
BUN: 17 mg/dL (ref 8–23)
CO2: 22 mmol/L (ref 22–32)
Calcium: 9.1 mg/dL (ref 8.9–10.3)
Chloride: 108 mmol/L (ref 98–111)
Creatinine, Ser: 1.26 mg/dL — ABNORMAL HIGH (ref 0.44–1.00)
GFR, Estimated: 39 mL/min — ABNORMAL LOW (ref >60)
Glucose, Bld: 114 mg/dL — ABNORMAL HIGH (ref 70–99)
Potassium: 4.8 mmol/L (ref 3.5–5.1)
Sodium: 142 mmol/L (ref 135–145)
Total Bilirubin: 0.4 mg/dL (ref 0.0–1.2)
Total Protein: 7.2 g/dL (ref 6.5–8.1)

## 2024-05-04 MED ORDER — ACETAMINOPHEN 500 MG PO TABS
1000.0000 mg | ORAL_TABLET | Freq: Once | ORAL | Status: AC
  Administered 2024-05-04: 1000 mg via ORAL
  Filled 2024-05-04: qty 2

## 2024-05-04 MED ORDER — IOHEXOL 350 MG/ML SOLN
100.0000 mL | Freq: Once | INTRAVENOUS | Status: AC | PRN
  Administered 2024-05-04: 75 mL via INTRAVENOUS

## 2024-05-04 MED ORDER — DROPERIDOL 2.5 MG/ML IJ SOLN
0.6250 mg | Freq: Once | INTRAMUSCULAR | Status: AC
  Administered 2024-05-04: 0.625 mg via INTRAVENOUS
  Filled 2024-05-04: qty 2

## 2024-05-04 MED ORDER — METOCLOPRAMIDE HCL 10 MG PO TABS
10.0000 mg | ORAL_TABLET | Freq: Every day | ORAL | 0 refills | Status: AC | PRN
  Filled 2024-05-04: qty 6, 6d supply, fill #0

## 2024-05-04 MED ORDER — MAGNESIUM SULFATE IN D5W 1-5 GM/100ML-% IV SOLN
1.0000 g | Freq: Once | INTRAVENOUS | Status: AC
  Administered 2024-05-04: 1 g via INTRAVENOUS
  Filled 2024-05-04: qty 100

## 2024-05-04 MED ORDER — DEXAMETHASONE SODIUM PHOSPHATE 10 MG/ML IJ SOLN
6.0000 mg | Freq: Once | INTRAMUSCULAR | Status: AC
  Administered 2024-05-04: 6 mg via INTRAVENOUS
  Filled 2024-05-04: qty 1

## 2024-05-04 MED ORDER — METOCLOPRAMIDE HCL 5 MG/ML IJ SOLN
10.0000 mg | Freq: Once | INTRAMUSCULAR | Status: AC
  Administered 2024-05-04: 10 mg via INTRAVENOUS
  Filled 2024-05-04: qty 2

## 2024-05-04 NOTE — ED Provider Notes (Signed)
 89 yo female here with headache  Physical Exam  BP (!) 128/105 (BP Location: Right Arm)   Pulse 68   Temp 97.9 F (36.6 C)   Resp 16   SpO2 100%   Physical Exam  Procedures  Procedures  ED Course / MDM    Medical Decision Making Amount and/or Complexity of Data Reviewed Labs: ordered. Radiology: ordered.  Risk OTC drugs. Prescription drug management.   CT angio reviewed with no emergent findings Pt reports headache significantly improved, though not entirely gone. She wants to go home.  We will give 6 mg IV decadron  before discharge  I spoke to her son about sparing use of reglan  at home - I certainly don't want her taking this with frequency at her age, but I gave her 6 tablets until she is able to hopefully follow up with [facility], whom she has seen in the past.  Headache is entirely frontal, above her eyes, appears tension doubt. I doubt GCA, meningitis, SAH.  Okay for discharge       Cottie Donnice PARAS, MD 05/04/24 [phone]

## 2024-05-04 NOTE — ED Triage Notes (Signed)
 Pt c/o frontal across my forehead, near my eyebrows HA x almost 2wks, I've never had one like this.  Family advises that she has already had CT scan, bloodwork but it's still giving her a fit.   Recently stopped amio, will be starting sotalol  next week after 30-day cleanse.

## 2024-05-04 NOTE — ED Notes (Signed)
 Pt given discharge instructions and reviewed prescriptions. Opportunities given for questions. Pt verbalizes understanding. PIV removed x1. Jillyn Hidden, RN

## 2024-05-04 NOTE — ED Provider Notes (Signed)
 [place] EMERGENCY DEPARTMENT AT [facility] Provider Note  CSN: [id number] Arrival date & time: 05/04/24 1142  Chief Complaint(s) Headache  HPI Sheila Jordan is a 89 y.o. female who is here today for headache.  Patient has a headache for approximately 2 weeks.  She does not often get headaches.  She followed with her PCP who did a CT Noncon that was negative.  She has not had any trouble with her vision, difficulty walking, just has a persistent headache.  Headache did not come out of nowhere, has not had any relieving or exacerbating factors.  Prior CVA, is on Eliquis .  Recently stopped amiodarone  due to it causing tremors.  Here today with her son provide history.   Past Medical History Past Medical History:  Diagnosis Date   Arthritis    Hypertension    Patient Active Problem List   Diagnosis Date Noted   Current use of long term anticoagulation 03/17/2024   Chronic diastolic heart failure (HCC) 08/30/2022   PAF (paroxysmal atrial fibrillation) (HCC) 08/30/2022   History of stroke 08/30/2022   Accelerated hypertension 08/30/2022   Acute diastolic (congestive) heart failure (HCC) 08/30/2022   Stroke (cerebrum) (HCC) 08/26/2022   Acute hypoxemic respiratory failure (HCC) 08/26/2022   Atrial fibrillation with RVR (HCC) 08/26/2022   Acute pulmonary edema (HCC) 08/26/2022   Pelvic fracture (HCC) 06/16/2020   HTN (hypertension) 06/15/2020   Inferior pubic ramus fracture, right, closed, initial encounter (HCC) 06/15/2020   Sacral fracture (HCC) 06/15/2020   Humeral head fracture, right, closed, initial encounter 06/15/2020   Home Medication(s) Prior to Admission medications   Medication Sig Start Date End Date Taking? Authorizing Provider  apixaban  (ELIQUIS ) 5 MG TABS tablet Take 1 tablet (5 mg total) by mouth 2 (two) times daily. 03/17/24   Mallipeddi, Vishnu P, MD  chlorhexidine  (PERIDEX ) 0.12 % solution Use as directed 15 mLs in the mouth or throat 2 (two) times  daily. Swish and spit twice daily 03/17/24   [provider]  docusate sodium  (COLACE) 100 MG capsule Take 100 mg by mouth daily as needed for mild constipation.    [provider]  furosemide  (LASIX ) 20 MG tablet Take 1 tablet (20 mg total) by mouth daily. May take an extra 20 mg for weight gain of 2 lbs overnight or 5 lbs in a week. 02/03/24   Parthenia Olivia HERO, PA-C  hydrALAZINE  (APRESOLINE ) 25 MG tablet Take 1 tablet (25 mg total) by mouth in the morning and at bedtime. 12/25/23   Mallipeddi, Vishnu P, MD  metoprolol  succinate (TOPROL  XL) 25 MG 24 hr tablet Take 1.5 tablets (37.5 mg total) by mouth daily. May take an extra 1/2 tablet for palpitations 02/03/24   Parthenia Olivia HERO, PA-C  pantoprazole  (PROTONIX ) 40 MG tablet Take 1 tablet (40 mg total) by mouth daily at 12 noon. 03/22/24   Mallipeddi, Vishnu P, MD  Potassium Chloride  ER 20 MEQ TBCR Take 1 tablet (20 mEq total) by mouth daily. 12/01/23   Mallipeddi, Vishnu P, MD  Simethicone  (PHAZYME ULTRA STRENGTH) 180 MG CAPS Take 2 capsules (360 mg total) by mouth 2 (two) times daily as needed. 04/03/23     sotalol  (BETAPACE ) 80 MG tablet Take 0.5 tablets (40 mg total) by mouth 2 (two) times daily. 05/12/24   Mallipeddi, Diannah SQUIBB, MD  Past Surgical History Past Surgical History:  Procedure Laterality Date   APPENDECTOMY     CHOLECYSTECTOMY     IR CT HEAD LTD  08/26/2022   IR PERCUTANEOUS ART THROMBECTOMY/INFUSION INTRACRANIAL INC DIAG ANGIO  08/26/2022   IR US  GUIDE VASC ACCESS RIGHT  08/26/2022   RADIOLOGY WITH ANESTHESIA N/A 08/26/2022   Procedure: RADIOLOGY WITH ANESTHESIA;  Surgeon: Radiologist, Medication, MD;  Location: [facility] OR;  Service: Radiology;  Laterality: N/A;   REVERSE SHOULDER ARTHROPLASTY Right 06/19/2020   Procedure: REVERSE TOTAL SHOULDER ARTHROPLASTY;  Surgeon: Cristy Bonner DASEN, MD;  Location:  [facility] OR;  Service: Orthopedics;  Laterality: Right;   Family History Family History  Problem Relation Age of Onset   Other Father        heart condition   Parkinson's disease Mother    Breast cancer Daughter     Social History Social History   Tobacco Use   Smoking status: Never   Smokeless tobacco: Never  Vaping Use   Vaping status: Never Used  Substance Use Topics   Alcohol use: No   Drug use: No   Allergies Patient has no known allergies.  Review of Systems Review of Systems  Physical Exam Vital Signs  I have reviewed the triage vital signs BP (!) 128/105 (BP Location: Right Arm)   Pulse 68   Temp 97.9 F (36.6 C)   Resp 16   SpO2 100%   Physical Exam Vitals and nursing note reviewed.  HENT:     Head: Normocephalic.  Cardiovascular:     Rate and Rhythm: Normal rate.  Pulmonary:     Effort: Pulmonary effort is normal.  Abdominal:     Palpations: Abdomen is soft.  Musculoskeletal:        General: Normal range of motion.  Skin:    General: Skin is warm.  Neurological:     Mental Status: She is alert.     Cranial Nerves: No cranial nerve deficit or facial asymmetry.     Sensory: No sensory deficit.     Gait: Gait normal.     Deep Tendon Reflexes: Reflexes normal.     ED Results and Treatments Labs (all labs ordered are listed, but only abnormal results are displayed) Labs Reviewed  COMPREHENSIVE METABOLIC PANEL WITH GFR - Abnormal; Notable for the following components:      Result Value   Glucose, Bld 114 (*)    Creatinine, Ser 1.26 (*)    GFR, Estimated 39 (*)    All other components within normal limits  CBC WITH DIFFERENTIAL/PLATELET                                                                                                                          Radiology No results found.  Pertinent labs & imaging results that were available during my care of the patient were reviewed by me and considered in my medical decision making (see MDM for  details).  Medications Ordered in ED  Medications  droperidol  (INAPSINE ) 2.5 MG/ML injection 0.625 mg (has no administration in time range)  metoCLOPramide  (REGLAN ) injection 10 mg (10 mg Intravenous Given 05/04/24 1345)  acetaminophen  (TYLENOL ) tablet 1,000 mg (1,000 mg Oral Given 05/04/24 1349)  magnesium  sulfate IVPB 1 g 100 mL (0 g Intravenous Stopped 05/04/24 1404)  iohexol  (OMNIPAQUE ) 350 MG/ML injection 100 mL (75 mLs Intravenous Contrast Given 05/04/24 1412)                                                                                                                                     Procedures Procedures  (including critical care time)  Medical Decision Making / ED Course   This patient presents to the ED for concern of headache, this involves an extensive number of treatment options, and is a complaint that carries with it a high risk of complications and morbidity.  The differential diagnosis includes SAH, tension type headache, chronic headache, vertebral dissection, carotid dissection, dehydration.  MDM: Overall well-appearing 89 year old female, given her duration of symptoms, will obtain CT angiography of the patient's head and neck.  She has no neurological deficits.  Reassessment 3 PM-patient unfortunately still experiencing some headache symptoms.  She has received Tylenol , Reglan .  Will give her small dose of droperidol .  Patient was signed out to Dr. Dorthea on pending CTA read and reassessment.   Additional history obtained: -Additional history obtained from son at bedside -External records from outside sourc son at bedside e obtained and reviewed including: Chart review including previous notes, labs, imaging, consultation notes   Lab Tests: -I ordered, reviewed, and interpreted labs.   The pertinent results include:   Labs Reviewed  COMPREHENSIVE METABOLIC PANEL WITH GFR - Abnormal; Notable for the following components:      Result Value   Glucose, Bld 114  (*)    Creatinine, Ser 1.26 (*)    GFR, Estimated 39 (*)    All other components within normal limits  CBC WITH DIFFERENTIAL/PLATELET         Imaging Studies ordered: I ordered imaging studies including a head neck CT I independently visualized and interpreted imaging. I agree with the radiologist interpretation   Medicines ordered and prescription drug management: Meds ordered this encounter  Medications   metoCLOPramide  (REGLAN ) injection 10 mg   acetaminophen  (TYLENOL ) tablet 1,000 mg   magnesium  sulfate IVPB 1 g 100 mL   iohexol  (OMNIPAQUE ) 350 MG/ML injection 100 mL   droperidol  (INAPSINE ) 2.5 MG/ML injection 0.625 mg    -I have reviewed the patients home medicines and have made adjustments as needed   Cardiac Monitoring: The patient was maintained on a cardiac monitor.  I personally viewed and interpreted the cardiac monitored which showed an underlying rhythm of: Normal sinus rhythm  Social Determinants of Health:  Factors impacting patients care include: Advanced age   Reevaluation: After the interventions noted above, I reevaluated the patient and found that they  have :improved  Co morbidities that complicate the patient evaluation  Past Medical History:  Diagnosis Date   Arthritis    Hypertension        Final Clinical Impression(s) / ED Diagnoses Final diagnoses:  Nonintractable headache, unspecified chronicity pattern, unspecified headache type     @PCDICTATION @    Mannie Pac T, DO 05/04/24 1502

## 2024-05-04 NOTE — Discharge Instructions (Signed)
 You are having a headache. This condition is very commonly seen in the Emergency Department. No specific cause was found today for your headache. It may have been a migraine or another cause of headache. Stress, anxiety, fatigue, and depression are common triggers for headaches.   Fortunately, your headache today does not appear to be life-threatening or require hospitalization at this time. This may change in the future. Sometimes headaches can appear benign (not harmful), but then more serious symptoms can develop, which should prompt an immediate re-evaluation by your doctor or the emergency department.  It is very important that you speak to your primary care doctor in the next 48 hours, to re-evaluate your symptoms, and to determine whether you need any further diagnostic testing or treatment.   SEEK MEDICAL ATTENTION IF: - you develop reaction or side effects to the medications prescribed.  - your headache is not improving within 48 hours - you have multiple episodes of vomiting or can't drink fluids - you have a change in pattern from your usual headache (eg. New location, new intensity, new symptoms, etc)  RETURN IMMEDIATELY IF you develop a sudden, severe worsening of your headache or confusion, become poorly responsive, feel like passing out, develop a fever above 100.2F, have a change in speech, vision, or swallowing, develop new weakness, numbness, tingling, or incoordination, or have a seizure.

## 2024-05-17 ENCOUNTER — Other Ambulatory Visit (HOSPITAL_COMMUNITY): Payer: Self-pay

## 2024-05-19 ENCOUNTER — Ambulatory Visit: Attending: Cardiology | Admitting: *Deleted

## 2024-05-19 DIAGNOSIS — I480 Paroxysmal atrial fibrillation: Secondary | ICD-10-CM | POA: Diagnosis not present

## 2024-05-19 NOTE — Progress Notes (Signed)
 Pt reports that she is having migraine headaches since stopping amiodarone . She is also having tremors since having a stroke. Pt was seen by neuro and discharged from neuro. Pt still has tremors and ask if there is any medication she can take to stop them or is there a medication she is taking causing these tremors. EKG in office today d/t stopping Amiodarone  and starting Sotalol  40 mg daily. Please advise.

## 2024-05-19 NOTE — Patient Instructions (Signed)
Medication Instructions:  Your physician recommends that you continue on your current medications as directed. Please refer to the Current Medication list given to you today.   Labwork: NONE   Testing/Procedures: NONE   Follow-Up: As scheduled   Any Other Special Instructions Will Be Listed Below (If Applicable).     If you need a refill on your cardiac medications before your next appointment, please call your pharmacy.

## 2024-05-21 ENCOUNTER — Ambulatory Visit: Payer: Self-pay | Admitting: Internal Medicine

## 2024-05-25 ENCOUNTER — Encounter: Payer: Self-pay | Admitting: Internal Medicine

## 2024-05-29 ENCOUNTER — Other Ambulatory Visit (HOSPITAL_COMMUNITY): Payer: Self-pay

## 2024-06-02 ENCOUNTER — Other Ambulatory Visit: Payer: Self-pay

## 2024-06-02 ENCOUNTER — Emergency Department (HOSPITAL_COMMUNITY)

## 2024-06-02 ENCOUNTER — Encounter (HOSPITAL_COMMUNITY): Payer: Self-pay | Admitting: Emergency Medicine

## 2024-06-02 ENCOUNTER — Inpatient Hospital Stay (HOSPITAL_COMMUNITY)
Admission: EM | Admit: 2024-06-02 | Discharge: 2024-06-07 | DRG: 522 | Disposition: A | Discharge status: Home Health | Attending: Student | Admitting: Student

## 2024-06-02 DIAGNOSIS — Z96611 Presence of right artificial shoulder joint: Secondary | ICD-10-CM | POA: Diagnosis not present

## 2024-06-02 DIAGNOSIS — W010XXA Fall on same level from slipping, tripping and stumbling without subsequent striking against object, initial encounter: Secondary | ICD-10-CM | POA: Diagnosis not present

## 2024-06-02 DIAGNOSIS — D62 Acute posthemorrhagic anemia: Secondary | ICD-10-CM | POA: Diagnosis not present

## 2024-06-02 DIAGNOSIS — G319 Degenerative disease of nervous system, unspecified: Secondary | ICD-10-CM | POA: Diagnosis not present

## 2024-06-02 DIAGNOSIS — S72012A Unspecified intracapsular fracture of left femur, initial encounter for closed fracture: Principal | ICD-10-CM | POA: Diagnosis present

## 2024-06-02 DIAGNOSIS — I10 Essential (primary) hypertension: Secondary | ICD-10-CM | POA: Diagnosis not present

## 2024-06-02 DIAGNOSIS — Y92017 Garden or yard in single-family (private) house as the place of occurrence of the external cause: Secondary | ICD-10-CM | POA: Diagnosis not present

## 2024-06-02 DIAGNOSIS — E440 Moderate protein-calorie malnutrition: Secondary | ICD-10-CM | POA: Diagnosis not present

## 2024-06-02 DIAGNOSIS — I130 Hypertensive heart and chronic kidney disease with heart failure and stage 1 through stage 4 chronic kidney disease, or unspecified chronic kidney disease: Secondary | ICD-10-CM | POA: Diagnosis present

## 2024-06-02 DIAGNOSIS — Z9049 Acquired absence of other specified parts of digestive tract: Secondary | ICD-10-CM

## 2024-06-02 DIAGNOSIS — I5032 Chronic diastolic (congestive) heart failure: Secondary | ICD-10-CM | POA: Diagnosis present

## 2024-06-02 DIAGNOSIS — Z803 Family history of malignant neoplasm of breast: Secondary | ICD-10-CM | POA: Diagnosis not present

## 2024-06-02 DIAGNOSIS — R609 Edema, unspecified: Secondary | ICD-10-CM | POA: Diagnosis not present

## 2024-06-02 DIAGNOSIS — Z7901 Long term (current) use of anticoagulants: Secondary | ICD-10-CM

## 2024-06-02 DIAGNOSIS — M1712 Unilateral primary osteoarthritis, left knee: Secondary | ICD-10-CM | POA: Diagnosis not present

## 2024-06-02 DIAGNOSIS — M1990 Unspecified osteoarthritis, unspecified site: Secondary | ICD-10-CM | POA: Diagnosis present

## 2024-06-02 DIAGNOSIS — I480 Paroxysmal atrial fibrillation: Secondary | ICD-10-CM | POA: Diagnosis not present

## 2024-06-02 DIAGNOSIS — Z79899 Other long term (current) drug therapy: Secondary | ICD-10-CM

## 2024-06-02 DIAGNOSIS — I499 Cardiac arrhythmia, unspecified: Secondary | ICD-10-CM | POA: Diagnosis not present

## 2024-06-02 DIAGNOSIS — I4891 Unspecified atrial fibrillation: Secondary | ICD-10-CM | POA: Diagnosis not present

## 2024-06-02 DIAGNOSIS — G4730 Sleep apnea, unspecified: Secondary | ICD-10-CM | POA: Diagnosis not present

## 2024-06-02 DIAGNOSIS — W19XXXA Unspecified fall, initial encounter: Secondary | ICD-10-CM | POA: Diagnosis not present

## 2024-06-02 DIAGNOSIS — N1832 Chronic kidney disease, stage 3b: Secondary | ICD-10-CM | POA: Diagnosis present

## 2024-06-02 DIAGNOSIS — S0990XA Unspecified injury of head, initial encounter: Secondary | ICD-10-CM | POA: Diagnosis not present

## 2024-06-02 DIAGNOSIS — K219 Gastro-esophageal reflux disease without esophagitis: Secondary | ICD-10-CM | POA: Diagnosis not present

## 2024-06-02 DIAGNOSIS — Z6825 Body mass index (BMI) 25.0-25.9, adult: Secondary | ICD-10-CM | POA: Diagnosis not present

## 2024-06-02 DIAGNOSIS — Z743 Need for continuous supervision: Secondary | ICD-10-CM | POA: Diagnosis not present

## 2024-06-02 DIAGNOSIS — Z8673 Personal history of transient ischemic attack (TIA), and cerebral infarction without residual deficits: Secondary | ICD-10-CM | POA: Diagnosis not present

## 2024-06-02 DIAGNOSIS — K5900 Constipation, unspecified: Secondary | ICD-10-CM | POA: Diagnosis not present

## 2024-06-02 DIAGNOSIS — Z471 Aftercare following joint replacement surgery: Secondary | ICD-10-CM | POA: Diagnosis not present

## 2024-06-02 DIAGNOSIS — Z96642 Presence of left artificial hip joint: Secondary | ICD-10-CM | POA: Diagnosis not present

## 2024-06-02 DIAGNOSIS — I272 Pulmonary hypertension, unspecified: Secondary | ICD-10-CM | POA: Diagnosis not present

## 2024-06-02 DIAGNOSIS — I6782 Cerebral ischemia: Secondary | ICD-10-CM | POA: Diagnosis not present

## 2024-06-02 DIAGNOSIS — S72042A Displaced fracture of base of neck of left femur, initial encounter for closed fracture: Secondary | ICD-10-CM | POA: Diagnosis not present

## 2024-06-02 DIAGNOSIS — Z820 Family history of epilepsy and other diseases of the nervous system: Secondary | ICD-10-CM | POA: Diagnosis not present

## 2024-06-02 DIAGNOSIS — S72002A Fracture of unspecified part of neck of left femur, initial encounter for closed fracture: Principal | ICD-10-CM | POA: Diagnosis present

## 2024-06-02 DIAGNOSIS — S199XXA Unspecified injury of neck, initial encounter: Secondary | ICD-10-CM | POA: Diagnosis not present

## 2024-06-02 HISTORY — DX: Cerebral infarction, unspecified: I63.9

## 2024-06-02 HISTORY — DX: Unspecified atrial fibrillation: I48.91

## 2024-06-02 HISTORY — DX: Cardiac arrhythmia, unspecified: I49.9

## 2024-06-02 LAB — CBC WITH DIFFERENTIAL/PLATELET
Abs Immature Granulocytes: 0.1 K/uL — ABNORMAL HIGH (ref 0.00–0.07)
Basophils Absolute: 0 K/uL (ref 0.0–0.1)
Basophils Relative: 0 %
Eosinophils Absolute: 0.1 K/uL (ref 0.0–0.5)
Eosinophils Relative: 1 %
HCT: 39.7 % (ref 36.0–46.0)
Hemoglobin: 13.2 g/dL (ref 12.0–15.0)
Immature Granulocytes: 1 %
Lymphocytes Relative: 9 %
Lymphs Abs: 0.9 K/uL (ref 0.7–4.0)
MCH: 30.6 pg (ref 26.0–34.0)
MCHC: 33.2 g/dL (ref 30.0–36.0)
MCV: 92.1 fL (ref 80.0–100.0)
Monocytes Absolute: 0.3 K/uL (ref 0.1–1.0)
Monocytes Relative: 3 %
Neutro Abs: 8.7 K/uL — ABNORMAL HIGH (ref 1.7–7.7)
Neutrophils Relative %: 86 %
Platelets: 219 K/uL (ref 150–400)
RBC: 4.31 MIL/uL (ref 3.87–5.11)
RDW: 14.9 % (ref 11.5–15.5)
WBC: 10 K/uL (ref 4.0–10.5)
nRBC: 0 % (ref 0.0–0.2)

## 2024-06-02 LAB — BASIC METABOLIC PANEL WITH GFR
Anion gap: 12 (ref 5–15)
BUN: 16 mg/dL (ref 8–23)
CO2: 22 mmol/L (ref 22–32)
Calcium: 8.8 mg/dL — ABNORMAL LOW (ref 8.9–10.3)
Chloride: 106 mmol/L (ref 98–111)
Creatinine, Ser: 1.16 mg/dL — ABNORMAL HIGH (ref 0.44–1.00)
GFR, Estimated: 44 mL/min — ABNORMAL LOW (ref >60)
Glucose, Bld: 134 mg/dL — ABNORMAL HIGH (ref 70–99)
Potassium: 3.7 mmol/L (ref 3.5–5.1)
Sodium: 140 mmol/L (ref 135–145)

## 2024-06-02 MED ORDER — FENTANYL CITRATE (PF) 100 MCG/2ML IJ SOLN: 12.5000 ug | INTRAMUSCULAR | Status: DC | PRN

## 2024-06-02 MED ORDER — PANTOPRAZOLE SODIUM 40 MG PO TBEC
40.0000 mg | DELAYED_RELEASE_TABLET | Freq: Every day | ORAL | Status: DC
  Administered 2024-06-03 – 2024-06-07 (×4): 40 mg via ORAL
  Filled 2024-06-02 (×4): qty 1

## 2024-06-02 MED ORDER — ONDANSETRON HCL 4 MG/2ML IJ SOLN
4.0000 mg | Freq: Once | INTRAMUSCULAR | Status: AC
  Administered 2024-06-02: 4 mg via INTRAVENOUS
  Filled 2024-06-02: qty 2

## 2024-06-02 MED ORDER — OXYCODONE HCL 5 MG PO TABS
2.5000 mg | ORAL_TABLET | ORAL | Status: DC | PRN
  Administered 2024-06-03 – 2024-06-07 (×7): 5 mg via ORAL
  Filled 2024-06-02 (×7): qty 1

## 2024-06-02 MED ORDER — METOPROLOL SUCCINATE ER 25 MG PO TB24
37.5000 mg | ORAL_TABLET | Freq: Every day | ORAL | Status: DC
  Administered 2024-06-03 – 2024-06-07 (×5): 37.5 mg via ORAL
  Filled 2024-06-02 (×5): qty 2

## 2024-06-02 MED ORDER — HYDROMORPHONE HCL 1 MG/ML IJ SOLN
0.5000 mg | Freq: Once | INTRAMUSCULAR | Status: AC
  Administered 2024-06-02: 0.5 mg via INTRAVENOUS
  Filled 2024-06-02: qty 0.5

## 2024-06-02 MED ORDER — HYDRALAZINE HCL 25 MG PO TABS
25.0000 mg | ORAL_TABLET | Freq: Two times a day (BID) | ORAL | Status: DC
Start: 2024-06-02 — End: 2024-06-07
  Administered 2024-06-03 – 2024-06-07 (×10): 25 mg via ORAL
  Filled 2024-06-02 (×10): qty 1

## 2024-06-02 MED ORDER — METHOCARBAMOL 500 MG PO TABS
500.0000 mg | ORAL_TABLET | Freq: Four times a day (QID) | ORAL | Status: DC | PRN
  Administered 2024-06-03 – 2024-06-06 (×2): 500 mg via ORAL
  Filled 2024-06-02 (×2): qty 1

## 2024-06-02 MED ORDER — PROCHLORPERAZINE EDISYLATE 10 MG/2ML IJ SOLN: 5.0000 mg | Freq: Four times a day (QID) | INTRAMUSCULAR | Status: DC | PRN

## 2024-06-02 MED ORDER — POLYETHYLENE GLYCOL 3350 17 G PO PACK: 17.0000 g | PACK | Freq: Every day | ORAL | Status: DC | PRN

## 2024-06-02 MED ORDER — SOTALOL HCL 80 MG PO TABS
40.0000 mg | ORAL_TABLET | Freq: Two times a day (BID) | ORAL | Status: DC
  Filled 2024-06-02 (×3): qty 0.5

## 2024-06-02 NOTE — H&P (Signed)
 History and Physical    Sheila Jordan FMW:[id number] DOB: Mar 11, 1928 DOA: 06/02/2024  PCP: Sheryle Carwin, MD   Patient coming from: Home   Chief Complaint: Left hip after a fall   HPI: Sheila Jordan is a pleasant 89 y.o. female with medical history significant for hypertension, CKD 3B, chronic diastolic CHF, PAF on Eliquis , and history of embolic CVA who presents with severe left hip pain after a fall at home.  Patient lives alone, and is fully independent, and was bending over to pick something up when she tripped over a cord and fell onto her left side.  She has had severe hip pain on the left side since then but denies losing consciousness or suffering any other appreciable injury.  She had been in her usual state leading up to this event, denies any recent illness, and denies ever experiencing chest pain with activity.  She last took her Eliquis  this morning.  ED Course: Upon arrival to the ED, patient is found to be afebrile and saturating low 90s on room air with normal HR and elevated BP.  Labs are most notable for creatinine 1.16, normal WBC, and normal hemoglobin.  Plain radiographs reveal left femoral neck fracture.  There are no acute findings on head CT or cervical spine CT but age-indeterminate compression fractures involving T1 and T3 were noted.  Orthopedic surgery (Dr. Celena) was consulted by the ED physician and recommended medical admission to [facility].  The patient was treated with Dilaudid  and Zofran .  Review of Systems:  All other systems reviewed and apart from HPI, are negative.  Past Medical History:  Diagnosis Date   Arthritis    Chronic diastolic heart failure (HCC) 08/30/2022   CKD stage 3b, GFR 30-44 ml/min (HCC) 06/02/2024   History of stroke 08/30/2022   Hypertension    PAF (paroxysmal atrial fibrillation) (HCC) 08/30/2022    Past Surgical History:  Procedure Laterality Date   APPENDECTOMY     CHOLECYSTECTOMY     IR CT HEAD LTD   08/26/2022   IR PERCUTANEOUS ART THROMBECTOMY/INFUSION INTRACRANIAL INC DIAG ANGIO  08/26/2022   IR US  GUIDE VASC ACCESS RIGHT  08/26/2022   RADIOLOGY WITH ANESTHESIA N/A 08/26/2022   Procedure: RADIOLOGY WITH ANESTHESIA;  Surgeon: Radiologist, Medication, MD;  Location: [facility] OR;  Service: Radiology;  Laterality: N/A;   REVERSE SHOULDER ARTHROPLASTY Right 06/19/2020   Procedure: REVERSE TOTAL SHOULDER ARTHROPLASTY;  Surgeon: Cristy Bonner DASEN, MD;  Location: [facility] OR;  Service: Orthopedics;  Laterality: Right;    Social History:   reports that she has never smoked. She has never used smokeless tobacco. She reports that she does not drink alcohol and does not use drugs.  No Known Allergies  Family History  Problem Relation Age of Onset   Other Father        heart condition   Parkinson's disease Mother    Breast cancer Daughter      Prior to Admission medications   Medication Sig Start Date End Date Taking? Authorizing Provider  apixaban  (ELIQUIS ) 5 MG TABS tablet Take 1 tablet (5 mg total) by mouth 2 (two) times daily. 03/17/24   Mallipeddi, Vishnu P, MD  chlorhexidine  (PERIDEX ) 0.12 % solution Use as directed 15 mLs in the mouth or throat 2 (two) times daily. Swish and spit twice daily 03/17/24   [provider]  docusate sodium  (COLACE) 100 MG capsule Take 100 mg by mouth daily as needed for mild constipation.    [provider]  furosemide  (LASIX ) 20 MG tablet Take 1 tablet (20 mg total) by mouth daily. May take an extra 20 mg for weight gain of 2 lbs overnight or 5 lbs in a week. 02/03/24   Parthenia Olivia HERO, PA-C  hydrALAZINE  (APRESOLINE ) 25 MG tablet Take 1 tablet (25 mg total) by mouth in the morning and at bedtime. 12/25/23   Mallipeddi, Vishnu P, MD  metoCLOPramide  (REGLAN ) 10 MG tablet Take 1 tablet (10 mg total) by mouth daily as needed for up to 6 doses for nausea. 05/04/24   Cottie Donnice PARAS, MD  metoprolol  succinate (TOPROL  XL) 25 MG 24 hr tablet Take 1.5 tablets (37.5  mg total) by mouth daily. May take an extra 1/2 tablet for palpitations 02/03/24   Parthenia Olivia HERO, PA-C  pantoprazole  (PROTONIX ) 40 MG tablet Take 1 tablet (40 mg total) by mouth daily at 12 noon. 03/22/24   Mallipeddi, Vishnu P, MD  Potassium Chloride  ER 20 MEQ TBCR Take 1 tablet (20 mEq total) by mouth daily. 12/01/23   Mallipeddi, Vishnu P, MD  Simethicone  (PHAZYME ULTRA STRENGTH) 180 MG CAPS Take 2 capsules (360 mg total) by mouth 2 (two) times daily as needed. 04/03/23     sotalol  (BETAPACE ) 80 MG tablet Take 0.5 tablets (40 mg total) by mouth 2 (two) times daily. 05/12/24   Mallipeddi, Diannah SQUIBB, MD    Physical Exam: Vitals:   06/02/24 2120 06/02/24 2125 06/02/24 2130 06/02/24 2156  BP:      Pulse: 74 76 80 78  Resp: 19 20 17 20   Temp:      TempSrc:      SpO2: 94% 91% 92% 95%    Constitutional: NAD, calm  Eyes: PERTLA, lids and conjunctivae normal ENMT: Mucous membranes are moist. Posterior pharynx clear of any exudate or lesions.   Neck: supple, no masses  Respiratory: no wheezing, no crackles. No accessory muscle use.  Cardiovascular: Rate ~80 and irregularly irregular. Right lower extremity edema.   Abdomen: No tenderness, soft. Bowel sounds active.  Musculoskeletal: no clubbing / cyanosis. Left hip tender; neurovascularly intact.   Skin: no significant rashes, lesions, ulcers. Warm, dry, well-perfused. Neurologic: CN 2-12 grossly intact. Moving all extremities. Alert and oriented.  Psychiatric: Pleasant. Cooperative.    Labs and Imaging on Admission: I have personally reviewed following labs and imaging studies  CBC: Recent Labs  Lab 06/02/24 2058  WBC 10.0  NEUTROABS 8.7*  HGB 13.2  HCT 39.7  MCV 92.1  PLT 219   Basic Metabolic Panel: Recent Labs  Lab 06/02/24 2058  NA 140  K 3.7  CL 106  CO2 22  GLUCOSE 134*  BUN 16  CREATININE 1.16*  CALCIUM 8.8*   GFR: CrCl cannot be calculated (Unknown ideal weight.). Liver Function Tests: No results for input(s):  AST, ALT, ALKPHOS, BILITOT, PROT, ALBUMIN in the last 168 hours. No results for input(s): LIPASE, AMYLASE in the last 168 hours. No results for input(s): AMMONIA in the last 168 hours. Coagulation Profile: No results for input(s): INR, PROTIME in the last 168 hours. Cardiac Enzymes: No results for input(s): CKTOTAL, CKMB, CKMBINDEX, TROPONINI in the last 168 hours. BNP (last 3 results) No results for input(s): PROBNP in the last 8760 hours. HbA1C: No results for input(s): HGBA1C in the last 72 hours. CBG: No results for input(s): GLUCAP in the last 168 hours. Lipid Profile: No results for input(s): CHOL, HDL, LDLCALC, TRIG, CHOLHDL, LDLDIRECT in the last 72 hours. Thyroid  Function Tests: No results for  input(s): TSH, T4TOTAL, FREET4, T3FREE, THYROIDAB in the last 72 hours. Anemia Panel: No results for input(s): VITAMINB12, FOLATE, FERRITIN, TIBC, IRON, RETICCTPCT in the last 72 hours. Urine analysis:    Component Value Date/Time   BILIRUBINUR small (A) 08/21/2022 1207   KETONESUR trace (5) (A) 08/21/2022 1207   PROTEINUR =30 (A) 08/21/2022 1207   UROBILINOGEN 0.2 08/21/2022 1207   NITRITE Negative 08/21/2022 1207   LEUKOCYTESUR Negative 08/21/2022 1207   Sepsis Labs: @LABRCNTIP (procalcitonin:4,lacticidven:4) )No results found for this or any previous visit (from the past 240 hours).   Radiological Exams on Admission: CT Head Wo Contrast Result Date: 06/02/2024 CLINICAL DATA:  Head trauma EXAM: CT HEAD WITHOUT CONTRAST CT CERVICAL SPINE WITHOUT CONTRAST TECHNIQUE: Multidetector CT imaging of the head and cervical spine was performed following the standard protocol without intravenous contrast. Multiplanar CT image reconstructions of the cervical spine were also generated. RADIATION DOSE REDUCTION: This exam was performed according to the departmental dose-optimization program which includes automated exposure  control, adjustment of the mA and/or kV according to patient size and/or use of iterative reconstruction technique. COMPARISON:  CT brain 05/04/2024, 04/26/2024, MRI 08/26/2022, CT thoracic spine 09/12/2022 FINDINGS: CT HEAD FINDINGS Brain: No acute territorial infarction, hemorrhage or intracranial mass. Mild atrophy. Mild to moderate chronic small vessel ischemic changes in the white matter. The ventricles are nonenlarged Vascular: No hyperdense vessels. Vertebral and carotid vascular calcification Skull: Normal. Negative for fracture or focal lesion. Sinuses/Orbits: No acute finding. Other: Small left forehead scalp soft tissue swelling. CT CERVICAL SPINE FINDINGS Alignment: Facet alignment is within normal limits. 7 mm anterior displacement tip of dens with respect to the clivus, but chronic compared with brain MRI from 2023. trace anterolisthesis C6 on C7 and C7 on T1. Skull base and vertebrae: No fracture of the cervical vertebral bodies. Moderate compression fracture T1, age indeterminate, appears new compared with 2023 thoracic CT. Mild to moderate T3 compression fracture age indeterminate, also appears new compared with 2023 CT. Soft tissues and spinal canal: No prevertebral fluid or swelling. No visible canal hematoma. Disc levels: Advanced C1-C2 degenerative change. Masslike probable inflammatory pannus posterior to the dens, also chronic. No high-grade bony canal stenosis. Relatively patent disc spaces. Hypertrophic facet degenerative changes at multiple levels with bilateral foraminal narrowing. Upper chest: No acute finding Other: None IMPRESSION: 1. No CT evidence for acute intracranial abnormality. Atrophy and chronic small vessel ischemic changes of the white matter. 2. No acute osseous abnormality of the cervical spine. 3. Moderate compression fracture of T1 and mild to moderate compression fracture of T3, age indeterminate, but appear new compared with 2023 CT. Electronically Signed   By: Luke Bun M.D.   On: 06/02/2024 21:23   CT Cervical Spine Wo Contrast Result Date: 06/02/2024 CLINICAL DATA:  Head trauma EXAM: CT HEAD WITHOUT CONTRAST CT CERVICAL SPINE WITHOUT CONTRAST TECHNIQUE: Multidetector CT imaging of the head and cervical spine was performed following the standard protocol without intravenous contrast. Multiplanar CT image reconstructions of the cervical spine were also generated. RADIATION DOSE REDUCTION: This exam was performed according to the departmental dose-optimization program which includes automated exposure control, adjustment of the mA and/or kV according to patient size and/or use of iterative reconstruction technique. COMPARISON:  CT brain 05/04/2024, 04/26/2024, MRI 08/26/2022, CT thoracic spine 09/12/2022 FINDINGS: CT HEAD FINDINGS Brain: No acute territorial infarction, hemorrhage or intracranial mass. Mild atrophy. Mild to moderate chronic small vessel ischemic changes in the white matter. The ventricles are nonenlarged Vascular: No hyperdense vessels. Vertebral  and carotid vascular calcification Skull: Normal. Negative for fracture or focal lesion. Sinuses/Orbits: No acute finding. Other: Small left forehead scalp soft tissue swelling. CT CERVICAL SPINE FINDINGS Alignment: Facet alignment is within normal limits. 7 mm anterior displacement tip of dens with respect to the clivus, but chronic compared with brain MRI from 2023. trace anterolisthesis C6 on C7 and C7 on T1. Skull base and vertebrae: No fracture of the cervical vertebral bodies. Moderate compression fracture T1, age indeterminate, appears new compared with 2023 thoracic CT. Mild to moderate T3 compression fracture age indeterminate, also appears new compared with 2023 CT. Soft tissues and spinal canal: No prevertebral fluid or swelling. No visible canal hematoma. Disc levels: Advanced C1-C2 degenerative change. Masslike probable inflammatory pannus posterior to the dens, also chronic. No high-grade bony  canal stenosis. Relatively patent disc spaces. Hypertrophic facet degenerative changes at multiple levels with bilateral foraminal narrowing. Upper chest: No acute finding Other: None IMPRESSION: 1. No CT evidence for acute intracranial abnormality. Atrophy and chronic small vessel ischemic changes of the white matter. 2. No acute osseous abnormality of the cervical spine. 3. Moderate compression fracture of T1 and mild to moderate compression fracture of T3, age indeterminate, but appear new compared with 2023 CT. Electronically Signed   By: Luke Bun M.D.   On: 06/02/2024 21:23   DG Hip Unilat W or Wo Pelvis 2-3 Views Left Result Date: 06/02/2024 CLINICAL DATA:  Fall and left-sided rib pain. EXAM: DG HIP (WITH OR WITHOUT PELVIS) 2-3V LEFT COMPARISON:  None Available. FINDINGS: There is a displaced fracture of the left femoral neck with slight proximal migration and impaction of the femoral shaft of the femoral head. No dislocation. Old fractures of the right pubic bone. The soft tissues are unremarkable. IMPRESSION: Displaced fracture of the left femoral neck. Electronically Signed   By: Vanetta Chou M.D.   On: 06/02/2024 20:27    Assessment/Plan   1. Left hip fracture  - Based on the available data, Sheila Jordan presents an estimated 1.1% risk of perioperative MI or cardiac arrest; she appears optimized for surgery and no further preoperative cardiac evaluation is indicated   - Continue pain-control and supportive care, hold Eliquis  (last dose was AM of 8/27), keep NPO after midnight    2. PAF  - Hold Eliquis , continue Toprol  and sotalol     3. Chronic diastolic CHF  - Appears compensated  - Hold Lasix  while NPO, monitor volume status   4. Hypertension  - Continue hydralazine     5. CKD 3B  - Appears to be at baseline  - Renally-dose medications    6. Hx of CVA  - Hold Eliquis  in anticipation of surgery    DVT prophylaxis: SCDs, last took Eliquis  the morning of 8/27 Code  Status: Full perioperatively  Level of Care: Level of care: Med-Surg Family Communication: Son at bedside  Disposition Plan:  Patient is from: home  Anticipated d/c is to: TBD Anticipated d/c date is: 06/06/24  Patient currently: pending orthopedic surgery consultation and likely operative hip repair  Consults called: Orthopedic surgery  Admission status: Inpatient     Evalene GORMAN Sprinkles, MD [facility]  06/02/2024, 11:09 PM

## 2024-06-02 NOTE — ED Notes (Signed)
[facility] CALLED FOR TRANSPORT °

## 2024-06-02 NOTE — ED Notes (Signed)
 Patient transported to CT

## 2024-06-02 NOTE — ED Triage Notes (Signed)
 Pt was out in the yard and bent over to move a sprayer and fell over onto left leg/side.  Is on thinners.  Did not hit head. No LOC.  ALert and oriented on arrival.  Hx of afib.  CBG 117, temp 98.6, BP 110s systolic.

## 2024-06-02 NOTE — ED Provider Notes (Signed)
 [place] EMERGENCY DEPARTMENT AT [facility] Provider Note   CSN: [id number] Arrival date & time: 06/02/24  1844     Patient presents with: Sheila Jordan   Sheila Jordan is a 89 y.o. female.   Patient has a history of atrial fibs and is on Eliquis .  She was bending over and she fell and hurt her left hip.  The history is provided by the patient and medical records. No language interpreter was used.  Fall This is a new problem. The current episode started 3 to 5 hours ago. The problem occurs rarely. The problem has been resolved. Pertinent negatives include no chest pain, no abdominal pain and no headaches. Nothing aggravates the symptoms. Nothing relieves the symptoms. She has tried nothing for the symptoms.       Prior to Admission medications   Medication Sig Start Date End Date Taking? Authorizing Provider  apixaban  (ELIQUIS ) 5 MG TABS tablet Take 1 tablet (5 mg total) by mouth 2 (two) times daily. 03/17/24   Mallipeddi, Vishnu P, MD  chlorhexidine  (PERIDEX ) 0.12 % solution Use as directed 15 mLs in the mouth or throat 2 (two) times daily. Swish and spit twice daily 03/17/24   [provider]  docusate sodium  (COLACE) 100 MG capsule Take 100 mg by mouth daily as needed for mild constipation.    [provider]  furosemide  (LASIX ) 20 MG tablet Take 1 tablet (20 mg total) by mouth daily. May take an extra 20 mg for weight gain of 2 lbs overnight or 5 lbs in a week. 02/03/24   Parthenia Olivia HERO, PA-C  hydrALAZINE  (APRESOLINE ) 25 MG tablet Take 1 tablet (25 mg total) by mouth in the morning and at bedtime. 12/25/23   Mallipeddi, Vishnu P, MD  metoCLOPramide  (REGLAN ) 10 MG tablet Take 1 tablet (10 mg total) by mouth daily as needed for up to 6 doses for nausea. 05/04/24   Cottie Donnice PARAS, MD  metoprolol  succinate (TOPROL  XL) 25 MG 24 hr tablet Take 1.5 tablets (37.5 mg total) by mouth daily. May take an extra 1/2 tablet for palpitations 02/03/24   Parthenia Olivia HERO,  PA-C  pantoprazole  (PROTONIX ) 40 MG tablet Take 1 tablet (40 mg total) by mouth daily at 12 noon. 03/22/24   Mallipeddi, Vishnu P, MD  Potassium Chloride  ER 20 MEQ TBCR Take 1 tablet (20 mEq total) by mouth daily. 12/01/23   Mallipeddi, Vishnu P, MD  Simethicone  (PHAZYME ULTRA STRENGTH) 180 MG CAPS Take 2 capsules (360 mg total) by mouth 2 (two) times daily as needed. 04/03/23     sotalol  (BETAPACE ) 80 MG tablet Take 0.5 tablets (40 mg total) by mouth 2 (two) times daily. 05/12/24   Mallipeddi, Diannah SQUIBB, MD    Allergies: Patient has no known allergies.    Review of Systems  Constitutional:  Negative for appetite change and fatigue.  HENT:  Negative for congestion, ear discharge and sinus pressure.   Eyes:  Negative for discharge.  Respiratory:  Negative for cough.   Cardiovascular:  Negative for chest pain.  Gastrointestinal:  Negative for abdominal pain and diarrhea.  Genitourinary:  Negative for frequency and hematuria.  Musculoskeletal:  Negative for back pain.       Left hip pain  Skin:  Negative for rash.  Neurological:  Negative for seizures and headaches.  Psychiatric/Behavioral:  Negative for hallucinations.     Updated Vital Signs BP (!) 175/89   Pulse 78   Temp (!) 97.5 F (36.4 C) (Oral)  Resp 20   SpO2 95%   Physical Exam Vitals and nursing note reviewed.  Constitutional:      Appearance: She is well-developed.  HENT:     Head: Normocephalic.     Nose: Nose normal.  Eyes:     General: No scleral icterus.    Conjunctiva/sclera: Conjunctivae normal.  Neck:     Thyroid : No thyromegaly.  Cardiovascular:     Rate and Rhythm: Normal rate and regular rhythm.     Heart sounds: No murmur heard.    No friction rub. No gallop.  Pulmonary:     Breath sounds: No stridor. No wheezing or rales.  Chest:     Chest wall: No tenderness.  Abdominal:     General: There is no distension.     Tenderness: There is no abdominal tenderness. There is no rebound.  Musculoskeletal:         General: Normal range of motion.     Cervical back: Neck supple.     Comments: Tenderness to left hip  Lymphadenopathy:     Cervical: No cervical adenopathy.  Skin:    Findings: No erythema or rash.  Neurological:     Mental Status: She is alert and oriented to person, place, and time.     Motor: No abnormal muscle tone.     Coordination: Coordination normal.  Psychiatric:        Behavior: Behavior normal.     (all labs ordered are listed, but only abnormal results are displayed) Labs Reviewed  CBC WITH DIFFERENTIAL/PLATELET - Abnormal; Notable for the following components:      Result Value   Neutro Abs 8.7 (*)    Abs Immature Granulocytes 0.10 (*)    All other components within normal limits  BASIC METABOLIC PANEL WITH GFR - Abnormal; Notable for the following components:   Glucose, Bld 134 (*)    Creatinine, Ser 1.16 (*)    Calcium 8.8 (*)    GFR, Estimated 44 (*)    All other components within normal limits    EKG: None  Radiology: CT Head Wo Contrast Result Date: 06/02/2024 CLINICAL DATA:  Head trauma EXAM: CT HEAD WITHOUT CONTRAST CT CERVICAL SPINE WITHOUT CONTRAST TECHNIQUE: Multidetector CT imaging of the head and cervical spine was performed following the standard protocol without intravenous contrast. Multiplanar CT image reconstructions of the cervical spine were also generated. RADIATION DOSE REDUCTION: This exam was performed according to the departmental dose-optimization program which includes automated exposure control, adjustment of the mA and/or kV according to patient size and/or use of iterative reconstruction technique. COMPARISON:  CT brain 05/04/2024, 04/26/2024, MRI 08/26/2022, CT thoracic spine 09/12/2022 FINDINGS: CT HEAD FINDINGS Brain: No acute territorial infarction, hemorrhage or intracranial mass. Mild atrophy. Mild to moderate chronic small vessel ischemic changes in the white matter. The ventricles are nonenlarged Vascular: No hyperdense  vessels. Vertebral and carotid vascular calcification Skull: Normal. Negative for fracture or focal lesion. Sinuses/Orbits: No acute finding. Other: Small left forehead scalp soft tissue swelling. CT CERVICAL SPINE FINDINGS Alignment: Facet alignment is within normal limits. 7 mm anterior displacement tip of dens with respect to the clivus, but chronic compared with brain MRI from 2023. trace anterolisthesis C6 on C7 and C7 on T1. Skull base and vertebrae: No fracture of the cervical vertebral bodies. Moderate compression fracture T1, age indeterminate, appears new compared with 2023 thoracic CT. Mild to moderate T3 compression fracture age indeterminate, also appears new compared with 2023 CT. Soft tissues and spinal canal:  No prevertebral fluid or swelling. No visible canal hematoma. Disc levels: Advanced C1-C2 degenerative change. Masslike probable inflammatory pannus posterior to the dens, also chronic. No high-grade bony canal stenosis. Relatively patent disc spaces. Hypertrophic facet degenerative changes at multiple levels with bilateral foraminal narrowing. Upper chest: No acute finding Other: None IMPRESSION: 1. No CT evidence for acute intracranial abnormality. Atrophy and chronic small vessel ischemic changes of the white matter. 2. No acute osseous abnormality of the cervical spine. 3. Moderate compression fracture of T1 and mild to moderate compression fracture of T3, age indeterminate, but appear new compared with 2023 CT. Electronically Signed   By: Luke Bun M.D.   On: 06/02/2024 21:23   CT Cervical Spine Wo Contrast Result Date: 06/02/2024 CLINICAL DATA:  Head trauma EXAM: CT HEAD WITHOUT CONTRAST CT CERVICAL SPINE WITHOUT CONTRAST TECHNIQUE: Multidetector CT imaging of the head and cervical spine was performed following the standard protocol without intravenous contrast. Multiplanar CT image reconstructions of the cervical spine were also generated. RADIATION DOSE REDUCTION: This exam was  performed according to the departmental dose-optimization program which includes automated exposure control, adjustment of the mA and/or kV according to patient size and/or use of iterative reconstruction technique. COMPARISON:  CT brain 05/04/2024, 04/26/2024, MRI 08/26/2022, CT thoracic spine 09/12/2022 FINDINGS: CT HEAD FINDINGS Brain: No acute territorial infarction, hemorrhage or intracranial mass. Mild atrophy. Mild to moderate chronic small vessel ischemic changes in the white matter. The ventricles are nonenlarged Vascular: No hyperdense vessels. Vertebral and carotid vascular calcification Skull: Normal. Negative for fracture or focal lesion. Sinuses/Orbits: No acute finding. Other: Small left forehead scalp soft tissue swelling. CT CERVICAL SPINE FINDINGS Alignment: Facet alignment is within normal limits. 7 mm anterior displacement tip of dens with respect to the clivus, but chronic compared with brain MRI from 2023. trace anterolisthesis C6 on C7 and C7 on T1. Skull base and vertebrae: No fracture of the cervical vertebral bodies. Moderate compression fracture T1, age indeterminate, appears new compared with 2023 thoracic CT. Mild to moderate T3 compression fracture age indeterminate, also appears new compared with 2023 CT. Soft tissues and spinal canal: No prevertebral fluid or swelling. No visible canal hematoma. Disc levels: Advanced C1-C2 degenerative change. Masslike probable inflammatory pannus posterior to the dens, also chronic. No high-grade bony canal stenosis. Relatively patent disc spaces. Hypertrophic facet degenerative changes at multiple levels with bilateral foraminal narrowing. Upper chest: No acute finding Other: None IMPRESSION: 1. No CT evidence for acute intracranial abnormality. Atrophy and chronic small vessel ischemic changes of the white matter. 2. No acute osseous abnormality of the cervical spine. 3. Moderate compression fracture of T1 and mild to moderate compression fracture  of T3, age indeterminate, but appear new compared with 2023 CT. Electronically Signed   By: Luke Bun M.D.   On: 06/02/2024 21:23   DG Hip Unilat W or Wo Pelvis 2-3 Views Left Result Date: 06/02/2024 CLINICAL DATA:  Fall and left-sided rib pain. EXAM: DG HIP (WITH OR WITHOUT PELVIS) 2-3V LEFT COMPARISON:  None Available. FINDINGS: There is a displaced fracture of the left femoral neck with slight proximal migration and impaction of the femoral shaft of the femoral head. No dislocation. Old fractures of the right pubic bone. The soft tissues are unremarkable. IMPRESSION: Displaced fracture of the left femoral neck. Electronically Signed   By: Vanetta Chou M.D.   On: 06/02/2024 20:27     Procedures   Medications Ordered in the ED  HYDROmorphone  (DILAUDID ) injection 0.5 mg (0.5 mg  Intravenous Given 06/02/24 2058)  ondansetron  (ZOFRAN ) injection 4 mg (4 mg Intravenous Given 06/02/24 2057)   Patient has a left hip fracture on x-ray.  They requested the group of [facility] orthopedics.  I spoke with Dr. Celena and he stated that the hospitalist can admit the patient over to [facility] and he will make sure the patient is consulted by orthopedics tomorrow.  He states patient needs to be n.p.o. after midnight and not take her Eliquis .  Patient also has old compression fractures of T1 and T3                                 Medical Decision Making Amount and/or Complexity of Data Reviewed Labs: ordered. Radiology: ordered.  Risk Prescription drug management. Decision regarding hospitalization.   Left hip fracture.  Patient will be admitted to medicine with Ortho consult     Final diagnoses:  Left displaced femoral neck fracture [facility])    ED Discharge Orders     None          Suzette Pac, MD 06/02/24 2236

## 2024-06-02 NOTE — ED Notes (Signed)
[facility] arrived to transport pt

## 2024-06-02 NOTE — ED Notes (Signed)
 ED Provider at bedside.

## 2024-06-03 ENCOUNTER — Inpatient Hospital Stay (HOSPITAL_COMMUNITY)

## 2024-06-03 DIAGNOSIS — E440 Moderate protein-calorie malnutrition: Secondary | ICD-10-CM | POA: Insufficient documentation

## 2024-06-03 DIAGNOSIS — S72002A Fracture of unspecified part of neck of left femur, initial encounter for closed fracture: Secondary | ICD-10-CM | POA: Diagnosis not present

## 2024-06-03 LAB — CBC
HCT: 37.1 % (ref 36.0–46.0)
Hemoglobin: 12.4 g/dL (ref 12.0–15.0)
MCH: 30 pg (ref 26.0–34.0)
MCHC: 33.4 g/dL (ref 30.0–36.0)
MCV: 89.8 fL (ref 80.0–100.0)
Platelets: 207 K/uL (ref 150–400)
RBC: 4.13 MIL/uL (ref 3.87–5.11)
RDW: 15 % (ref 11.5–15.5)
WBC: 12.6 K/uL — ABNORMAL HIGH (ref 4.0–10.5)
nRBC: 0 % (ref 0.0–0.2)

## 2024-06-03 LAB — BASIC METABOLIC PANEL WITH GFR
Anion gap: 9 (ref 5–15)
BUN: 14 mg/dL (ref 8–23)
CO2: 22 mmol/L (ref 22–32)
Calcium: 8.8 mg/dL — ABNORMAL LOW (ref 8.9–10.3)
Chloride: 107 mmol/L (ref 98–111)
Creatinine, Ser: 1.14 mg/dL — ABNORMAL HIGH (ref 0.44–1.00)
GFR, Estimated: 45 mL/min — ABNORMAL LOW (ref >60)
Glucose, Bld: 133 mg/dL — ABNORMAL HIGH (ref 70–99)
Potassium: 4.4 mmol/L (ref 3.5–5.1)
Sodium: 138 mmol/L (ref 135–145)

## 2024-06-03 LAB — SURGICAL PCR SCREEN
MRSA, PCR: NEGATIVE
Staphylococcus aureus: NEGATIVE

## 2024-06-03 MED ORDER — FENTANYL CITRATE PF 50 MCG/ML IJ SOSY: 12.5000 ug | PREFILLED_SYRINGE | INTRAMUSCULAR | Status: DC | PRN

## 2024-06-03 MED ORDER — SOTALOL HCL 80 MG PO TABS
40.0000 mg | ORAL_TABLET | Freq: Two times a day (BID) | ORAL | Status: DC
  Administered 2024-06-03 – 2024-06-07 (×9): 40 mg via ORAL
  Filled 2024-06-03 (×10): qty 0.5

## 2024-06-03 NOTE — Progress Notes (Signed)
  Progress Note   Patient: Sheila Jordan FMW:[id number] DOB: Sep 16, 1928 DOA: 06/02/2024     1 DOS: the patient was seen and examined on 06/03/2024   Brief hospital course: The patient is a 89 yr old woman who tripped and fell in her yard while spraying some week killer. She had immediate pain in her left hip and could not get up. In th eEd she was found to have a left hip fracture. Orthopedic surgery was consulted. The plan is for the patient to go for ORIF later today.  She carries a past medical history significant for arthritis, chronic diastolic heart failulre, CKD IIIb, History of CVA, hypertension, PAF.  Assessment and Plan: Left hip fracture  - Based on the available data, Sheila Jordan presents an estimated 1.1% risk of perioperative MI or cardiac arrest; she appears optimized for surgery and no further preoperative cardiac evaluation is indicated   - Continue pain-control and supportive care, hold Eliquis  (last dose was AM of 8/27), keep NPO after midnight   - Plan is for ORIF later today.   2. PAF  - Rate is controlled on metoprolol  and sotalol . Both have been held. - Stroke prophylaxis on eliquis .This has also been held.    3. Chronic diastolic CHF  - Appears compensated  - Hold Lasix  while NPO, monitor volume status    4. Hypertension  - Continue hydralazine      5. CKD 3B  - Appears to be at baseline  - Renally-dose medications     6. Hx of CVA  - Hold Eliquis  in anticipation of surgery      DVT prophylaxis: SCDs, last took Eliquis  the morning of 8/27 Code Status: Full perioperatively  Level of Care: Level of care: Med-Surg Family Communication: Son at bedside  Disposition Plan:  Patient is from: home  Anticipated d/c is to: TBD Anticipated d/c date is: 06/06/24  Patient currently: pending orthopedic surgery consultation and likely operative hip repair  Consults called: Orthopedic surgery        Subjective: The patient is resting comfortably. No new  complaints.   Physical Exam: Vitals:   06/03/24 0454 06/03/24 0811 06/03/24 1201 06/03/24 1408  BP: (!) 149/71 (!) 142/68  (!) 140/65  Pulse: 77 76  75  Resp: 16 16  16   Temp: 97.9 F (36.6 C) 98.3 F (36.8 C)  99.1 F (37.3 C)  TempSrc:  Oral  Oral  SpO2: 95% 94% 95% (!) 88%   Exam:  Constitutional:  The patient is awake, alert, and oriented x 3. No acute distress. Respiratory:  No increased work of breathing. No wheezes, rales, or rhonchi No tactile fremitus Cardiovascular:  Regular rate and rhythm No murmurs, ectopy, or gallups. No lateral PMI. No thrills. Abdomen:  Abdomen is soft, non-tender, non-distended No hernias, masses, or organomegaly Normoactive bowel sounds.  Musculoskeletal:  No cyanosis, clubbing, or edema Skin:  No rashes, lesions, ulcers palpation of skin: no induration or nodules Psychiatric:  Mental status Mood, affect appropriate Orientation to person, place, time  judgment and insight appear intact  Data Reviewed:  CBC BMP X-ray hip  Family Communication: Family is at bedside.  Disposition: Status is: Inpatient Remains inpatient appropriate because: Pending ORIF of the hip.  Planned Discharge Destination: Rehab    Time spent: 34 minutes  Author: Teigan Sahli, DO 06/03/2024 6:22 PM  For on call review [email or web address].

## 2024-06-03 NOTE — Discharge Instructions (Signed)
 Nutrition Post Hospital Stay Proper nutrition can help your body recover from illness and injury.   Foods and beverages high in protein, vitamins, and minerals help rebuild muscle loss, promote healing, & reduce fall risk.   In addition to eating healthy foods, a nutrition shake is an easy, delicious way to get the nutrition you need during and after your hospital stay  It is recommended that you continue to drink 2 bottles per day of:       Ensure/Boost for at least 1 month (30 days) after your hospital stay   Tips for adding a nutrition shake into your routine: As allowed, drink one with vitamins or medications instead of water  or juice Enjoy one as a tasty mid-morning or afternoon snack Drink cold or make a milkshake out of it Drink one instead of milk with cereal or snacks Use as a coffee creamer   Available at the following grocery stores and pharmacies:           * [place] [facility] * [employer] * [employer]  * [employer]          * Walmart * [employer]  * Wal[facility]reens      * Target  * [employer]J's   * CVS  * [employer]   * [facility] [phone]            For COUPONS visit: www.ensure.com/join or RoleLink.com.br   Suggested Substitutions Ensure Plus = Boost Plus = Carnation Breakfast Essentials = Boost Compact Ensure Active Clear = Boost Breeze Glucerna Shake = Boost Glucose Control = Carnation Breakfast Essentials SUGAR FREE      Dr. Redell Shoals Joint Replacement Specialist [facility] [address] [place], [place] [address] [phone]   TOTAL HIP REPLACEMENT POSTOPERATIVE DIRECTIONS    Hip Rehabilitation, Guidelines Following Surgery   WEIGHT BEARING Weight bearing as tolerated with assist device (walker, cane, etc) as directed, use it as long as suggested by your surgeon or therapist, typically at least 4-6 weeks.  The results of a hip operation are greatly improved after range of motion and  muscle strengthening exercises. Follow all safety measures which are given to protect your hip. If any of these exercises cause increased pain or swelling in your joint, decrease the amount until you are comfortable again. Then slowly increase the exercises. Call your caregiver if you have problems or questions.   HOME CARE INSTRUCTIONS  Most of the following instructions are designed to prevent the dislocation of your new hip.  Remove items at home which could result in a fall. This includes throw rugs or furniture in walking pathways.  Continue medications as instructed at time of discharge. You may have some home medications which will be placed on hold until you complete the course of blood thinner medication. You may start showering once you are discharged home. Do not remove your dressing. Do not put on socks or shoes without following the instructions of your caregivers.   Sit on chairs with arms. Use the chair arms to help push yourself up when arising.  Arrange for the use of a toilet seat elevator so you are not sitting low.  Walk with walker as instructed.  You may resume a sexual relationship in one month or when given the OK by your caregiver.  Use walker as long as suggested by your caregivers.  You may put full weight on your legs and walk  as much as is comfortable. Avoid periods of inactivity such as sitting longer than an hour when not asleep. This helps prevent blood clots.  You may return to work once you are cleared by Designer, industrial/product.  Do not drive a car for 6 weeks or until released by your surgeon.  Do not drive while taking narcotics.  Wear elastic stockings for two weeks following surgery during the day but you may remove then at night.  Make sure you keep all of your appointments after your operation with all of your doctors and caregivers. You should call the office at the above phone number and make an appointment for approximately two weeks after the date of your  surgery. Please pick up a stool softener and laxative for home use as long as you are requiring pain medications. ICE to the affected hip every three hours for 30 minutes at a time and then as needed for pain and swelling. Continue to use ice on the hip for pain and swelling from surgery. You may notice swelling that will progress down to the foot and ankle.  This is normal after surgery.  Elevate the leg when you are not up walking on it.   It is important for you to complete the blood thinner medication as prescribed by your doctor. Continue to use the breathing machine which will help keep your temperature down.  It is common for your temperature to cycle up and down following surgery, especially at night when you are not up moving around and exerting yourself.  The breathing machine keeps your lungs expanded and your temperature down.  RANGE OF MOTION AND STRENGTHENING EXERCISES  These exercises are designed to help you keep full movement of your hip joint. Follow your caregiver's or physical therapist's instructions. Perform all exercises about fifteen times, three times per day or as directed. Exercise both hips, even if you have had only one joint replacement. These exercises can be done on a training (exercise) mat, on the floor, on a table or on a bed. Use whatever works the best and is most comfortable for you. Use music or television while you are exercising so that the exercises are a pleasant break in your day. This will make your life better with the exercises acting as a break in routine you can look forward to.  Lying on your back, slowly slide your foot toward your buttocks, raising your knee up off the floor. Then slowly slide your foot back down until your leg is straight again.  Lying on your back spread your legs as far apart as you can without causing discomfort.  Lying on your side, raise your upper leg and foot straight up from the floor as far as is comfortable. Slowly lower the  leg and repeat.  Lying on your back, tighten up the muscle in the front of your thigh (quadriceps muscles). You can do this by keeping your leg straight and trying to raise your heel off the floor. This helps strengthen the largest muscle supporting your knee.  Lying on your back, tighten up the muscles of your buttocks both with the legs straight and with the knee bent at a comfortable angle while keeping your heel on the floor.   SKILLED REHAB INSTRUCTIONS: If the patient is transferred to a skilled rehab facility following release from the hospital, a list of the current medications will be sent to the facility for the patient to continue.  When discharged from the skilled rehab facility,  please have the facility set up the patient's Home Health Physical Therapy prior to being released. Also, the skilled facility will be responsible for providing the patient with their medications at time of release from the facility to include their pain medication and their blood thinner medication. If the patient is still at the rehab facility at time of the two week follow up appointment, the skilled rehab facility will also need to assist the patient in arranging follow up appointment in our office and any transportation needs.  POST-OPERATIVE OPIOID TAPER INSTRUCTIONS: It is important to wean off of your opioid medication as soon as possible. If you do not need pain medication after your surgery it is ok to stop day one. Opioids include: Codeine, Hydrocodone (Norco, Vicodin), Oxycodone (Percocet, oxycontin ) and hydromorphone  amongst others.  Long term and even short term use of opiods can cause: Increased pain response Dependence Constipation Depression Respiratory depression And more.  Withdrawal symptoms can include Flu like symptoms Nausea, vomiting And more Techniques to manage these symptoms Hydrate well Eat regular healthy meals Stay active Use relaxation techniques(deep breathing, meditating,  yoga) Do Not substitute Alcohol to help with tapering If you have been on opioids for less than two weeks and do not have pain than it is ok to stop all together.  Plan to wean off of opioids This plan should start within one week post op of your joint replacement. Maintain the same interval or time between taking each dose and first decrease the dose.  Cut the total daily intake of opioids by one tablet each day Next start to increase the time between doses. The last dose that should be eliminated is the evening dose.    MAKE SURE YOU:  Understand these instructions.  Will watch your condition.  Will get help right away if you are not doing well or get worse.  Pick up stool softner and laxative for home use following surgery while on pain medications. Do not remove your dressing. The dressing is waterproof--it is OK to take showers. Continue to use ice for pain and swelling after surgery. Do not use any lotions or creams on the incision until instructed by your surgeon. Total Hip Protocol.

## 2024-06-03 NOTE — Plan of Care (Signed)

## 2024-06-03 NOTE — Consult Note (Signed)
 Reason for Consult:Left hip fx Referring Physician: Ava Swayze Time called: 0730 Time at bedside: 0913   Sheila Jordan is an 89 y.o. female.  HPI: [name] was out in her yard spraying some weed killer when she tripped and fell. She had immediate left hip pain and could not get up. She was brought to the ED where x-rays showed a left hip fx and orthopedic surgery was consulted. She lives at home alone and does not use any assistive devices to ambulate.  Past Medical History:  Diagnosis Date   Arthritis    Chronic diastolic heart failure (HCC) 08/30/2022   CKD stage 3b, GFR 30-44 ml/min (HCC) 06/02/2024   History of stroke 08/30/2022   Hypertension    PAF (paroxysmal atrial fibrillation) (HCC) 08/30/2022    Past Surgical History:  Procedure Laterality Date   APPENDECTOMY     CHOLECYSTECTOMY     IR CT HEAD LTD  08/26/2022   IR PERCUTANEOUS ART THROMBECTOMY/INFUSION INTRACRANIAL INC DIAG ANGIO  08/26/2022   IR US  GUIDE VASC ACCESS RIGHT  08/26/2022   RADIOLOGY WITH ANESTHESIA N/A 08/26/2022   Procedure: RADIOLOGY WITH ANESTHESIA;  Surgeon: Radiologist, Medication, MD;  Location: [facility] OR;  Service: Radiology;  Laterality: N/A;   REVERSE SHOULDER ARTHROPLASTY Right 06/19/2020   Procedure: REVERSE TOTAL SHOULDER ARTHROPLASTY;  Surgeon: Cristy Bonner DASEN, MD;  Location: [facility] OR;  Service: Orthopedics;  Laterality: Right;    Family History  Problem Relation Age of Onset   Other Father        heart condition   Parkinson's disease Mother    Breast cancer Daughter     Social History:  reports that she has never smoked. She has never used smokeless tobacco. She reports that she does not drink alcohol and does not use drugs.  Allergies: No Known Allergies  Medications: I have reviewed the patient's current medications.  Results for orders placed or performed during the hospital encounter of 06/02/24 (from the past 48 hours)  CBC with Differential     Status: Abnormal   Collection Time:  06/02/24  8:58 PM  Result Value Ref Range   WBC 10.0 4.0 - 10.5 K/uL   RBC 4.31 3.87 - 5.11 MIL/uL   Hemoglobin 13.2 12.0 - 15.0 g/dL   HCT 60.2 63.9 - 53.9 %   MCV 92.1 80.0 - 100.0 fL   MCH 30.6 26.0 - 34.0 pg   MCHC 33.2 30.0 - 36.0 g/dL   RDW 85.0 88.4 - 84.4 %   Platelets 219 150 - 400 K/uL   nRBC 0.0 0.0 - 0.2 %   Neutrophils Relative % 86 %   Neutro Abs 8.7 (H) 1.7 - 7.7 K/uL   Lymphocytes Relative 9 %   Lymphs Abs 0.9 0.7 - 4.0 K/uL   Monocytes Relative 3 %   Monocytes Absolute 0.3 0.1 - 1.0 K/uL   Eosinophils Relative 1 %   Eosinophils Absolute 0.1 0.0 - 0.5 K/uL   Basophils Relative 0 %   Basophils Absolute 0.0 0.0 - 0.1 K/uL   Immature Granulocytes 1 %   Abs Immature Granulocytes 0.10 (H) 0.00 - 0.07 K/uL    Comment: Performed at [facility], [address] [place], [place] [address]  Basic metabolic panel     Status: Abnormal   Collection Time: 06/02/24  8:58 PM  Result Value Ref Range   Sodium 140 135 - 145 mmol/L   Potassium 3.7 3.5 - 5.1 mmol/L   Chloride 106 98 - 111 mmol/L  CO2 22 22 - 32 mmol/L   Glucose, Bld 134 (H) 70 - 99 mg/dL    Comment: Glucose reference range applies only to samples taken after fasting for at least 8 hours.   BUN 16 8 - 23 mg/dL   Creatinine, Ser 8.83 (H) 0.44 - 1.00 mg/dL   Calcium 8.8 (L) 8.9 - 10.3 mg/dL   GFR, Estimated 44 (L) >60 mL/min    Comment: (NOTE) Calculated using the CKD-EPI Creatinine Equation (2021)    Anion gap 12 5 - 15    Comment: Performed at [facility], [address], [place], [place] [address]  Basic metabolic panel with GFR     Status: Abnormal   Collection Time: 06/03/24  4:37 AM  Result Value Ref Range   Sodium 138 135 - 145 mmol/L   Potassium 4.4 3.5 - 5.1 mmol/L   Chloride 107 98 - 111 mmol/L   CO2 22 22 - 32 mmol/L   Glucose, Bld 133 (H) 70 - 99 mg/dL    Comment: Glucose reference range applies only to samples taken after fasting for at least 8 hours.   BUN 14 8 - 23 mg/dL   Creatinine, Ser  8.85 (H) 0.44 - 1.00 mg/dL   Calcium 8.8 (L) 8.9 - 10.3 mg/dL   GFR, Estimated 45 (L) >60 mL/min    Comment: (NOTE) Calculated using the CKD-EPI Creatinine Equation (2021)    Anion gap 9 5 - 15    Comment: Performed at [facility], [address] [place], [place] [address]  CBC     Status: Abnormal   Collection Time: 06/03/24  4:37 AM  Result Value Ref Range   WBC 12.6 (H) 4.0 - 10.5 K/uL   RBC 4.13 3.87 - 5.11 MIL/uL   Hemoglobin 12.4 12.0 - 15.0 g/dL   HCT 62.8 63.9 - 53.9 %   MCV 89.8 80.0 - 100.0 fL   MCH 30.0 26.0 - 34.0 pg   MCHC 33.4 30.0 - 36.0 g/dL   RDW 84.9 88.4 - 84.4 %   Platelets 207 150 - 400 K/uL   nRBC 0.0 0.0 - 0.2 %    Comment: Performed at [facility], [address] [place], [place] [address]    CT Head Wo Contrast Result Date: 06/02/2024 CLINICAL DATA:  Head trauma EXAM: CT HEAD WITHOUT CONTRAST CT CERVICAL SPINE WITHOUT CONTRAST TECHNIQUE: Multidetector CT imaging of the head and cervical spine was performed following the standard protocol without intravenous contrast. Multiplanar CT image reconstructions of the cervical spine were also generated. RADIATION DOSE REDUCTION: This exam was performed according to the departmental dose-optimization program which includes automated exposure control, adjustment of the mA and/or kV according to patient size and/or use of iterative reconstruction technique. COMPARISON:  CT brain 05/04/2024, 04/26/2024, MRI 08/26/2022, CT thoracic spine 09/12/2022 FINDINGS: CT HEAD FINDINGS Brain: No acute territorial infarction, hemorrhage or intracranial mass. Mild atrophy. Mild to moderate chronic small vessel ischemic changes in the white matter. The ventricles are nonenlarged Vascular: No hyperdense vessels. Vertebral and carotid vascular calcification Skull: Normal. Negative for fracture or focal lesion. Sinuses/Orbits: No acute finding. Other: Small left forehead scalp soft tissue swelling. CT CERVICAL SPINE FINDINGS  Alignment: Facet alignment is within normal limits. 7 mm anterior displacement tip of dens with respect to the clivus, but chronic compared with brain MRI from 2023. trace anterolisthesis C6 on C7 and C7 on T1. Skull base and vertebrae: No fracture of the cervical vertebral bodies. Moderate compression  fracture T1, age indeterminate, appears new compared with 2023 thoracic CT. Mild to moderate T3 compression fracture age indeterminate, also appears new compared with 2023 CT. Soft tissues and spinal canal: No prevertebral fluid or swelling. No visible canal hematoma. Disc levels: Advanced C1-C2 degenerative change. Masslike probable inflammatory pannus posterior to the dens, also chronic. No high-grade bony canal stenosis. Relatively patent disc spaces. Hypertrophic facet degenerative changes at multiple levels with bilateral foraminal narrowing. Upper chest: No acute finding Other: None IMPRESSION: 1. No CT evidence for acute intracranial abnormality. Atrophy and chronic small vessel ischemic changes of the white matter. 2. No acute osseous abnormality of the cervical spine. 3. Moderate compression fracture of T1 and mild to moderate compression fracture of T3, age indeterminate, but appear new compared with 2023 CT. Electronically Signed   By: Luke Bun M.D.   On: 06/02/2024 21:23   CT Cervical Spine Wo Contrast Result Date: 06/02/2024 CLINICAL DATA:  Head trauma EXAM: CT HEAD WITHOUT CONTRAST CT CERVICAL SPINE WITHOUT CONTRAST TECHNIQUE: Multidetector CT imaging of the head and cervical spine was performed following the standard protocol without intravenous contrast. Multiplanar CT image reconstructions of the cervical spine were also generated. RADIATION DOSE REDUCTION: This exam was performed according to the departmental dose-optimization program which includes automated exposure control, adjustment of the mA and/or kV according to patient size and/or use of iterative reconstruction technique.  COMPARISON:  CT brain 05/04/2024, 04/26/2024, MRI 08/26/2022, CT thoracic spine 09/12/2022 FINDINGS: CT HEAD FINDINGS Brain: No acute territorial infarction, hemorrhage or intracranial mass. Mild atrophy. Mild to moderate chronic small vessel ischemic changes in the white matter. The ventricles are nonenlarged Vascular: No hyperdense vessels. Vertebral and carotid vascular calcification Skull: Normal. Negative for fracture or focal lesion. Sinuses/Orbits: No acute finding. Other: Small left forehead scalp soft tissue swelling. CT CERVICAL SPINE FINDINGS Alignment: Facet alignment is within normal limits. 7 mm anterior displacement tip of dens with respect to the clivus, but chronic compared with brain MRI from 2023. trace anterolisthesis C6 on C7 and C7 on T1. Skull base and vertebrae: No fracture of the cervical vertebral bodies. Moderate compression fracture T1, age indeterminate, appears new compared with 2023 thoracic CT. Mild to moderate T3 compression fracture age indeterminate, also appears new compared with 2023 CT. Soft tissues and spinal canal: No prevertebral fluid or swelling. No visible canal hematoma. Disc levels: Advanced C1-C2 degenerative change. Masslike probable inflammatory pannus posterior to the dens, also chronic. No high-grade bony canal stenosis. Relatively patent disc spaces. Hypertrophic facet degenerative changes at multiple levels with bilateral foraminal narrowing. Upper chest: No acute finding Other: None IMPRESSION: 1. No CT evidence for acute intracranial abnormality. Atrophy and chronic small vessel ischemic changes of the white matter. 2. No acute osseous abnormality of the cervical spine. 3. Moderate compression fracture of T1 and mild to moderate compression fracture of T3, age indeterminate, but appear new compared with 2023 CT. Electronically Signed   By: Luke Bun M.D.   On: 06/02/2024 21:23   DG Hip Unilat W or Wo Pelvis 2-3 Views Left Result Date: 06/02/2024 CLINICAL  DATA:  Fall and left-sided rib pain. EXAM: DG HIP (WITH OR WITHOUT PELVIS) 2-3V LEFT COMPARISON:  None Available. FINDINGS: There is a displaced fracture of the left femoral neck with slight proximal migration and impaction of the femoral shaft of the femoral head. No dislocation. Old fractures of the right pubic bone. The soft tissues are unremarkable. IMPRESSION: Displaced fracture of the left femoral neck. Electronically Signed  By: Vanetta Chou M.D.   On: 06/02/2024 20:27    Review of Systems  HENT:  Negative for ear discharge, ear pain, hearing loss and tinnitus.   Eyes:  Negative for photophobia and pain.  Respiratory:  Negative for cough and shortness of breath.   Cardiovascular:  Negative for chest pain.  Gastrointestinal:  Negative for abdominal pain, nausea and vomiting.  Genitourinary:  Negative for dysuria, flank pain, frequency and urgency.  Musculoskeletal:  Positive for arthralgias (Left hip). Negative for back pain, myalgias and neck pain.  Neurological:  Negative for dizziness and headaches.  Hematological:  Does not bruise/bleed easily.  Psychiatric/Behavioral:  The patient is not nervous/anxious.    Blood pressure (!) 142/68, pulse 76, temperature 98.3 F (36.8 C), temperature source Oral, resp. rate 16, SpO2 94%. Physical Exam Constitutional:      General: She is not in acute distress.    Appearance: She is well-developed. She is not diaphoretic.  HENT:     Head: Normocephalic and atraumatic.  Eyes:     General: No scleral icterus.       Right eye: No discharge.        Left eye: No discharge.     Conjunctiva/sclera: Conjunctivae normal.  Cardiovascular:     Rate and Rhythm: Normal rate and regular rhythm.  Pulmonary:     Effort: Pulmonary effort is normal. No respiratory distress.  Musculoskeletal:     Cervical back: Normal range of motion.     Comments: LLE No traumatic wounds, ecchymosis, or rash  Mod TTP hip  No knee or ankle effusion  Knee stable to  varus/ valgus and anterior/posterior stress  Sens DPN, SPN, TN intact  Motor EHL, ext, flex, evers 5/5  DP 1+, PT 0, No significant edema  Skin:    General: Skin is warm and dry.  Neurological:     Mental Status: She is alert.  Psychiatric:        Mood and Affect: Mood normal.        Behavior: Behavior normal.     Assessment/Plan: Left hip fx -- Plan hip hemi today with Dr. Beverley. Please keep NPO.    Ozell DOROTHA Ned, PA-C Orthopedic Surgery [phone] 06/03/2024, 9:28 AM

## 2024-06-03 NOTE — Progress Notes (Signed)
 Initial Nutrition Assessment  DOCUMENTATION CODES:   Non-severe (moderate) malnutrition in context of chronic illness (CHF, aging related decline)  INTERVENTION:  After pt's surgery and diet is ordered, recommend: Liberalized regular diet to provide increased options to pt and promote adequate intake for post op healing  Ensure Plus High Protein po BID, each supplement provides 350 kcal and 20 grams of protein.  Magic cup TID with meals, each supplement provides 290 kcal and 9 grams of protein  Added nutrition discharge instructions to AVS  NUTRITION DIAGNOSIS:   Moderate Malnutrition related to chronic illness (CHF, aging related decline) as evidenced by moderate muscle depletion, moderate fat depletion.  GOAL:   Patient will meet greater than or equal to 90% of their needs  MONITOR:   PO intake, Supplement acceptance  REASON FOR ASSESSMENT:   Consult Assessment of nutrition requirement/status, Hip fracture protocol  ASSESSMENT:   Pt with hx HTN, CKD 3B, CHF, and CVA. Hx of R total shoulder arthroplasty (2021). Admitted following fall at home with L hip pain, diagnosed L hip fx.  Pt kept NPO after midnight with plans of surgical intervention today.   Spoke with pt and pt's son who was at bedside. Pt resting in bed and states her pain is well managed at the time. Pt will be going for surgery tonight around 6pm. Pt has been NPO since admission so no diet intake recorded at time of assessment.  PTA, pt reports good appetite and eating 3 meals per day.  Breakfast: bagel, 2 pieces of bacon, sometimes eggs Lunch: leftovers from weekly Sunday family lunch  Dinner: some type of meat, side, and vegetable Pt states she lives on her own and is very independent. She reports fixing all of her own meals and not using any mobility devices for assistance. Son reports pt still cooks big family lunch every Sunday and will use leftovers from that meal as her lunches for the week. Pt states  she enjoys cooking for her family and says she has 4 children with grandchildren and even great grandchildren. Pt did not drink any ONS prior to admission, but agreeable to try Ensure while admitted to help with intake after surgery.  Pt endorses wt loss over the last 10 years but reports the loss has been gradual. Pt reports appetite has also decreased over time with aging which likely coincided with wt loss. Physical exam shows moderate muscle and moderate fat depletions, likely related to malnutrition due to decreased appetite. Suspect this has been chronic issue.   Discussed importance of adequate nutrition intake after surgery and increased calorie and protein needs related to post op recovery. Added nutrition discharge instructions to AVS.   Medications reviewed and include:  Protonix   Labs reviewed  NUTRITION - FOCUSED PHYSICAL EXAM:  Flowsheet Row Most Recent Value  Orbital Region Moderate depletion  Upper Arm Region Moderate depletion  Thoracic and Lumbar Region Moderate depletion  Buccal Region Moderate depletion  Temple Region Moderate depletion  Clavicle Bone Region Moderate depletion  Clavicle and Acromion Bone Region Moderate depletion  Scapular Bone Region Moderate depletion  Dorsal Hand Severe depletion  Patellar Region Unable to assess  [BLE edema]  Anterior Thigh Region Unable to assess  [BLE edema]  Posterior Calf Region Unable to assess  [BLE edema]  Edema (RD Assessment) Mild  [BLE, non pitting]  Hair Reviewed  Eyes Reviewed  Mouth Reviewed  Skin Reviewed  Nails Reviewed    Diet Order:   Diet Order  Diet NPO time specified Except for: Sips with Meds, Ice Chips  Diet effective now                   EDUCATION NEEDS:   Education needs have been addressed  Skin:  Skin Assessment: Reviewed RN Assessment  Last BM:  PTA  Height:   Ht Readings from Last 1 Encounters:  04/01/24 5' 4 (1.626 m)    Weight:   Wt Readings from Last 1  Encounters:  04/01/24 64.6 kg    Ideal Body Weight:  54.5 kg  BMI:  There is no height or weight on file to calculate BMI.  Estimated Nutritional Needs:   Kcal:  1400-1600  Protein:  65-85g  Fluid:  1.4-1.6L    Josette Glance, MS, RDN, LDN Clinical Dietitian I Please reach out via secure chat

## 2024-06-03 NOTE — H&P (View-Only) (Signed)
 Reason for Consult:Left hip fx Referring Physician: Ava Swayze Time called: 0730 Time at bedside: 0913   Sheila Jordan is an 89 y.o. female.  HPI: Sheila Jordan was out in her yard spraying some weed killer when Sheila Jordan tripped and fell. Sheila Jordan had immediate left hip pain and could not get up. Sheila Jordan was brought to the ED where x-rays showed a left hip fx and orthopedic surgery was consulted. Sheila Jordan lives at home alone and does not use any assistive devices to ambulate.  Past Medical History:  Diagnosis Date   Arthritis    Chronic diastolic heart failure (HCC) 08/30/2022   CKD stage 3b, GFR 30-44 ml/min (HCC) 06/02/2024   History of stroke 08/30/2022   Hypertension    PAF (paroxysmal atrial fibrillation) (HCC) 08/30/2022    Past Surgical History:  Procedure Laterality Date   APPENDECTOMY     CHOLECYSTECTOMY     IR CT HEAD LTD  08/26/2022   IR PERCUTANEOUS ART THROMBECTOMY/INFUSION INTRACRANIAL INC DIAG ANGIO  08/26/2022   IR US  GUIDE VASC ACCESS RIGHT  08/26/2022   RADIOLOGY WITH ANESTHESIA N/A 08/26/2022   Procedure: RADIOLOGY WITH ANESTHESIA;  Surgeon: Radiologist, Medication, MD;  Location: [facility] OR;  Service: Radiology;  Laterality: N/A;   REVERSE SHOULDER ARTHROPLASTY Right 06/19/2020   Procedure: REVERSE TOTAL SHOULDER ARTHROPLASTY;  Surgeon: Cristy Bonner DASEN, MD;  Location: [facility] OR;  Service: Orthopedics;  Laterality: Right;    Family History  Problem Relation Age of Onset   Other Father        heart condition   Parkinson's disease Mother    Breast cancer Daughter     Social History:  reports that Sheila Jordan has never smoked. Sheila Jordan has never used smokeless tobacco. Sheila Jordan reports that Sheila Jordan does not drink alcohol and does not use drugs.  Allergies: No Known Allergies  Medications: I have reviewed the patient's current medications.  Results for orders placed or performed during the hospital encounter of 06/02/24 (from the past 48 hours)  CBC with Differential     Status: Abnormal   Collection Time:  06/02/24  8:58 PM  Result Value Ref Range   WBC 10.0 4.0 - 10.5 K/uL   RBC 4.31 3.87 - 5.11 MIL/uL   Hemoglobin 13.2 12.0 - 15.0 g/dL   HCT 60.2 63.9 - 53.9 %   MCV 92.1 80.0 - 100.0 fL   MCH 30.6 26.0 - 34.0 pg   MCHC 33.2 30.0 - 36.0 g/dL   RDW 85.0 88.4 - 84.4 %   Platelets 219 150 - 400 K/uL   nRBC 0.0 0.0 - 0.2 %   Neutrophils Relative % 86 %   Neutro Abs 8.7 (H) 1.7 - 7.7 K/uL   Lymphocytes Relative 9 %   Lymphs Abs 0.9 0.7 - 4.0 K/uL   Monocytes Relative 3 %   Monocytes Absolute 0.3 0.1 - 1.0 K/uL   Eosinophils Relative 1 %   Eosinophils Absolute 0.1 0.0 - 0.5 K/uL   Basophils Relative 0 %   Basophils Absolute 0.0 0.0 - 0.1 K/uL   Immature Granulocytes 1 %   Abs Immature Granulocytes 0.10 (H) 0.00 - 0.07 K/uL    Comment: Performed at [facility], [address] [place], [place] [address]  Basic metabolic panel     Status: Abnormal   Collection Time: 06/02/24  8:58 PM  Result Value Ref Range   Sodium 140 135 - 145 mmol/L   Potassium 3.7 3.5 - 5.1 mmol/L   Chloride 106 98 - 111 mmol/L  CO2 22 22 - 32 mmol/L   Glucose, Bld 134 (H) 70 - 99 mg/dL    Comment: Glucose reference range applies only to samples taken after fasting for at least 8 hours.   BUN 16 8 - 23 mg/dL   Creatinine, Ser 8.83 (H) 0.44 - 1.00 mg/dL   Calcium 8.8 (L) 8.9 - 10.3 mg/dL   GFR, Estimated 44 (L) >60 mL/min    Comment: (NOTE) Calculated using the CKD-EPI Creatinine Equation (2021)    Anion gap 12 5 - 15    Comment: Performed at [facility], [address], [place], [place] [address]  Basic metabolic panel with GFR     Status: Abnormal   Collection Time: 06/03/24  4:37 AM  Result Value Ref Range   Sodium 138 135 - 145 mmol/L   Potassium 4.4 3.5 - 5.1 mmol/L   Chloride 107 98 - 111 mmol/L   CO2 22 22 - 32 mmol/L   Glucose, Bld 133 (H) 70 - 99 mg/dL    Comment: Glucose reference range applies only to samples taken after fasting for at least 8 hours.   BUN 14 8 - 23 mg/dL   Creatinine, Ser  8.85 (H) 0.44 - 1.00 mg/dL   Calcium 8.8 (L) 8.9 - 10.3 mg/dL   GFR, Estimated 45 (L) >60 mL/min    Comment: (NOTE) Calculated using the CKD-EPI Creatinine Equation (2021)    Anion gap 9 5 - 15    Comment: Performed at [facility], [address] [place], [place] [address]  CBC     Status: Abnormal   Collection Time: 06/03/24  4:37 AM  Result Value Ref Range   WBC 12.6 (H) 4.0 - 10.5 K/uL   RBC 4.13 3.87 - 5.11 MIL/uL   Hemoglobin 12.4 12.0 - 15.0 g/dL   HCT 62.8 63.9 - 53.9 %   MCV 89.8 80.0 - 100.0 fL   MCH 30.0 26.0 - 34.0 pg   MCHC 33.4 30.0 - 36.0 g/dL   RDW 84.9 88.4 - 84.4 %   Platelets 207 150 - 400 K/uL   nRBC 0.0 0.0 - 0.2 %    Comment: Performed at [facility], [address] [place], [place] [address]    CT Head Wo Contrast Result Date: 06/02/2024 CLINICAL DATA:  Head trauma EXAM: CT HEAD WITHOUT CONTRAST CT CERVICAL SPINE WITHOUT CONTRAST TECHNIQUE: Multidetector CT imaging of the head and cervical spine was performed following the standard protocol without intravenous contrast. Multiplanar CT image reconstructions of the cervical spine were also generated. RADIATION DOSE REDUCTION: This exam was performed according to the departmental dose-optimization program which includes automated exposure control, adjustment of the mA and/or kV according to patient size and/or use of iterative reconstruction technique. COMPARISON:  CT brain 05/04/2024, 04/26/2024, MRI 08/26/2022, CT thoracic spine 09/12/2022 FINDINGS: CT HEAD FINDINGS Brain: No acute territorial infarction, hemorrhage or intracranial mass. Mild atrophy. Mild to moderate chronic small vessel ischemic changes in the white matter. The ventricles are nonenlarged Vascular: No hyperdense vessels. Vertebral and carotid vascular calcification Skull: Normal. Negative for fracture or focal lesion. Sinuses/Orbits: No acute finding. Other: Small left forehead scalp soft tissue swelling. CT CERVICAL SPINE FINDINGS  Alignment: Facet alignment is within normal limits. 7 mm anterior displacement tip of dens with respect to the clivus, but chronic compared with brain MRI from 2023. trace anterolisthesis C6 on C7 and C7 on T1. Skull base and vertebrae: No fracture of the cervical vertebral bodies. Moderate compression  fracture T1, age indeterminate, appears new compared with 2023 thoracic CT. Mild to moderate T3 compression fracture age indeterminate, also appears new compared with 2023 CT. Soft tissues and spinal canal: No prevertebral fluid or swelling. No visible canal hematoma. Disc levels: Advanced C1-C2 degenerative change. Masslike probable inflammatory pannus posterior to the dens, also chronic. No high-grade bony canal stenosis. Relatively patent disc spaces. Hypertrophic facet degenerative changes at multiple levels with bilateral foraminal narrowing. Upper chest: No acute finding Other: None IMPRESSION: 1. No CT evidence for acute intracranial abnormality. Atrophy and chronic small vessel ischemic changes of the white matter. 2. No acute osseous abnormality of the cervical spine. 3. Moderate compression fracture of T1 and mild to moderate compression fracture of T3, age indeterminate, but appear new compared with 2023 CT. Electronically Signed   By: Luke Bun M.D.   On: 06/02/2024 21:23   CT Cervical Spine Wo Contrast Result Date: 06/02/2024 CLINICAL DATA:  Head trauma EXAM: CT HEAD WITHOUT CONTRAST CT CERVICAL SPINE WITHOUT CONTRAST TECHNIQUE: Multidetector CT imaging of the head and cervical spine was performed following the standard protocol without intravenous contrast. Multiplanar CT image reconstructions of the cervical spine were also generated. RADIATION DOSE REDUCTION: This exam was performed according to the departmental dose-optimization program which includes automated exposure control, adjustment of the mA and/or kV according to patient size and/or use of iterative reconstruction technique.  COMPARISON:  CT brain 05/04/2024, 04/26/2024, MRI 08/26/2022, CT thoracic spine 09/12/2022 FINDINGS: CT HEAD FINDINGS Brain: No acute territorial infarction, hemorrhage or intracranial mass. Mild atrophy. Mild to moderate chronic small vessel ischemic changes in the white matter. The ventricles are nonenlarged Vascular: No hyperdense vessels. Vertebral and carotid vascular calcification Skull: Normal. Negative for fracture or focal lesion. Sinuses/Orbits: No acute finding. Other: Small left forehead scalp soft tissue swelling. CT CERVICAL SPINE FINDINGS Alignment: Facet alignment is within normal limits. 7 mm anterior displacement tip of dens with respect to the clivus, but chronic compared with brain MRI from 2023. trace anterolisthesis C6 on C7 and C7 on T1. Skull base and vertebrae: No fracture of the cervical vertebral bodies. Moderate compression fracture T1, age indeterminate, appears new compared with 2023 thoracic CT. Mild to moderate T3 compression fracture age indeterminate, also appears new compared with 2023 CT. Soft tissues and spinal canal: No prevertebral fluid or swelling. No visible canal hematoma. Disc levels: Advanced C1-C2 degenerative change. Masslike probable inflammatory pannus posterior to the dens, also chronic. No high-grade bony canal stenosis. Relatively patent disc spaces. Hypertrophic facet degenerative changes at multiple levels with bilateral foraminal narrowing. Upper chest: No acute finding Other: None IMPRESSION: 1. No CT evidence for acute intracranial abnormality. Atrophy and chronic small vessel ischemic changes of the white matter. 2. No acute osseous abnormality of the cervical spine. 3. Moderate compression fracture of T1 and mild to moderate compression fracture of T3, age indeterminate, but appear new compared with 2023 CT. Electronically Signed   By: Luke Bun M.D.   On: 06/02/2024 21:23   DG Hip Unilat W or Wo Pelvis 2-3 Views Left Result Date: 06/02/2024 CLINICAL  DATA:  Fall and left-sided rib pain. EXAM: DG HIP (WITH OR WITHOUT PELVIS) 2-3V LEFT COMPARISON:  None Available. FINDINGS: There is a displaced fracture of the left femoral neck with slight proximal migration and impaction of the femoral shaft of the femoral head. No dislocation. Old fractures of the right pubic bone. The soft tissues are unremarkable. IMPRESSION: Displaced fracture of the left femoral neck. Electronically Signed  By: Vanetta Chou M.D.   On: 06/02/2024 20:27    Review of Systems  HENT:  Negative for ear discharge, ear pain, hearing loss and tinnitus.   Eyes:  Negative for photophobia and pain.  Respiratory:  Negative for cough and shortness of breath.   Cardiovascular:  Negative for chest pain.  Gastrointestinal:  Negative for abdominal pain, nausea and vomiting.  Genitourinary:  Negative for dysuria, flank pain, frequency and urgency.  Musculoskeletal:  Positive for arthralgias (Left hip). Negative for back pain, myalgias and neck pain.  Neurological:  Negative for dizziness and headaches.  Hematological:  Does not bruise/bleed easily.  Psychiatric/Behavioral:  The patient is not nervous/anxious.    Blood pressure (!) 142/68, pulse 76, temperature 98.3 F (36.8 C), temperature source Oral, resp. rate 16, SpO2 94%. Physical Exam Constitutional:      General: Sheila Jordan is not in acute distress.    Appearance: Sheila Jordan is well-developed. Sheila Jordan is not diaphoretic.  HENT:     Head: Normocephalic and atraumatic.  Eyes:     General: No scleral icterus.       Right eye: No discharge.        Left eye: No discharge.     Conjunctiva/sclera: Conjunctivae normal.  Cardiovascular:     Rate and Rhythm: Normal rate and regular rhythm.  Pulmonary:     Effort: Pulmonary effort is normal. No respiratory distress.  Musculoskeletal:     Cervical back: Normal range of motion.     Comments: LLE No traumatic wounds, ecchymosis, or rash  Mod TTP hip  No knee or ankle effusion  Knee stable to  varus/ valgus and anterior/posterior stress  Sens DPN, SPN, TN intact  Motor EHL, ext, flex, evers 5/5  DP 1+, PT 0, No significant edema  Skin:    General: Skin is warm and dry.  Neurological:     Mental Status: Sheila Jordan is alert.  Psychiatric:        Mood and Affect: Mood normal.        Behavior: Behavior normal.     Assessment/Plan: Left hip fx -- Plan hip hemi today with Dr. Beverley. Please keep NPO.    Ozell DOROTHA Ned, PA-C Orthopedic Surgery [phone] 06/03/2024, 9:28 AM

## 2024-06-03 NOTE — Plan of Care (Signed)

## 2024-06-04 ENCOUNTER — Inpatient Hospital Stay (HOSPITAL_COMMUNITY): Admitting: Anesthesiology

## 2024-06-04 ENCOUNTER — Inpatient Hospital Stay (HOSPITAL_COMMUNITY)

## 2024-06-04 ENCOUNTER — Encounter (HOSPITAL_COMMUNITY)
Admission: EM | Disposition: A | Discharge status: Home Health | Payer: Self-pay | Source: Home / Self Care | Attending: Internal Medicine

## 2024-06-04 ENCOUNTER — Encounter (HOSPITAL_COMMUNITY): Payer: Self-pay | Admitting: Family Medicine

## 2024-06-04 DIAGNOSIS — S72002A Fracture of unspecified part of neck of left femur, initial encounter for closed fracture: Secondary | ICD-10-CM

## 2024-06-04 DIAGNOSIS — I480 Paroxysmal atrial fibrillation: Secondary | ICD-10-CM

## 2024-06-04 DIAGNOSIS — I130 Hypertensive heart and chronic kidney disease with heart failure and stage 1 through stage 4 chronic kidney disease, or unspecified chronic kidney disease: Secondary | ICD-10-CM | POA: Diagnosis not present

## 2024-06-04 DIAGNOSIS — I5032 Chronic diastolic (congestive) heart failure: Secondary | ICD-10-CM

## 2024-06-04 HISTORY — PX: HIP ARTHROPLASTY: SHX981

## 2024-06-04 LAB — CBC
HCT: 37.7 % (ref 36.0–46.0)
Hemoglobin: 12.5 g/dL (ref 12.0–15.0)
MCH: 30.1 pg (ref 26.0–34.0)
MCHC: 33.2 g/dL (ref 30.0–36.0)
MCV: 90.8 fL (ref 80.0–100.0)
Platelets: 206 K/uL (ref 150–400)
RBC: 4.15 MIL/uL (ref 3.87–5.11)
RDW: 15.4 % (ref 11.5–15.5)
WBC: 9.4 K/uL (ref 4.0–10.5)
nRBC: 0 % (ref 0.0–0.2)

## 2024-06-04 LAB — BASIC METABOLIC PANEL WITH GFR
Anion gap: 12 (ref 5–15)
BUN: 19 mg/dL (ref 8–23)
CO2: 21 mmol/L — ABNORMAL LOW (ref 22–32)
Calcium: 8.6 mg/dL — ABNORMAL LOW (ref 8.9–10.3)
Chloride: 104 mmol/L (ref 98–111)
Creatinine, Ser: 1.33 mg/dL — ABNORMAL HIGH (ref 0.44–1.00)
GFR, Estimated: 37 mL/min — ABNORMAL LOW (ref >60)
Glucose, Bld: 110 mg/dL — ABNORMAL HIGH (ref 70–99)
Potassium: 4.3 mmol/L (ref 3.5–5.1)
Sodium: 137 mmol/L (ref 135–145)

## 2024-06-04 SURGERY — HEMIARTHROPLASTY (BIPOLAR) HIP, POSTERIOR APPROACH FOR FRACTURE
Anesthesia: General | Laterality: Left

## 2024-06-04 MED ORDER — CHLORHEXIDINE GLUCONATE 0.12 % MT SOLN
OROMUCOSAL | Status: AC
  Administered 2024-06-04: 15 mL via OROMUCOSAL
  Filled 2024-06-04: qty 15

## 2024-06-04 MED ORDER — IRRISEPT - 450ML BOTTLE WITH 0.05% CHG IN STERILE WATER, USP 99.95% OPTIME
TOPICAL | Status: DC | PRN
  Administered 2024-06-04: 450 mL

## 2024-06-04 MED ORDER — SENNA 8.6 MG PO TABS
1.0000 | ORAL_TABLET | Freq: Two times a day (BID) | ORAL | Status: DC
  Administered 2024-06-04 – 2024-06-06 (×4): 8.6 mg via ORAL
  Filled 2024-06-04 (×4): qty 1

## 2024-06-04 MED ORDER — FENTANYL CITRATE (PF) 100 MCG/2ML IJ SOLN: 25.0000 ug | INTRAMUSCULAR | Status: DC | PRN

## 2024-06-04 MED ORDER — ACETAMINOPHEN 500 MG PO TABS
1000.0000 mg | ORAL_TABLET | Freq: Once | ORAL | Status: AC
  Administered 2024-06-04: 1000 mg via ORAL
  Filled 2024-06-04: qty 2

## 2024-06-04 MED ORDER — OXYCODONE HCL 5 MG/5ML PO SOLN: 5.0000 mg | Freq: Once | ORAL | Status: DC | PRN

## 2024-06-04 MED ORDER — PHENYLEPHRINE HCL-NACL 20-0.9 MG/250ML-% IV SOLN
INTRAVENOUS | Status: DC | PRN
Start: 2024-06-04 — End: 2024-06-04
  Administered 2024-06-04: 40 ug/min via INTRAVENOUS

## 2024-06-04 MED ORDER — BUPIVACAINE-EPINEPHRINE (PF) 0.5% -1:200000 IJ SOLN
INTRAMUSCULAR | Status: AC
  Filled 2024-06-04: qty 30

## 2024-06-04 MED ORDER — LIDOCAINE 2% (20 MG/ML) 5 ML SYRINGE
INTRAMUSCULAR | Status: AC
  Filled 2024-06-04: qty 5

## 2024-06-04 MED ORDER — METOCLOPRAMIDE HCL 5 MG/ML IJ SOLN: 5.0000 mg | Freq: Three times a day (TID) | INTRAMUSCULAR | Status: DC | PRN

## 2024-06-04 MED ORDER — ONDANSETRON HCL 4 MG/2ML IJ SOLN
INTRAMUSCULAR | Status: DC | PRN
  Administered 2024-06-04: 4 mg via INTRAVENOUS

## 2024-06-04 MED ORDER — ROCURONIUM BROMIDE 10 MG/ML (PF) SYRINGE
PREFILLED_SYRINGE | INTRAVENOUS | Status: DC | PRN
  Administered 2024-06-04: 10 mg via INTRAVENOUS
  Administered 2024-06-04: 40 mg via INTRAVENOUS

## 2024-06-04 MED ORDER — PROPOFOL 10 MG/ML IV BOLUS
INTRAVENOUS | Status: DC | PRN
  Administered 2024-06-04: 50 mg via INTRAVENOUS

## 2024-06-04 MED ORDER — LIDOCAINE 2% (20 MG/ML) 5 ML SYRINGE
INTRAMUSCULAR | Status: DC | PRN
  Administered 2024-06-04: 20 mg via INTRAVENOUS

## 2024-06-04 MED ORDER — SODIUM CHLORIDE 0.9 % IV SOLN: INTRAVENOUS | Status: DC

## 2024-06-04 MED ORDER — KETOROLAC TROMETHAMINE 30 MG/ML IJ SOLN
INTRAMUSCULAR | Status: AC
Start: 2024-06-04 — End: 2024-06-04
  Filled 2024-06-04: qty 1

## 2024-06-04 MED ORDER — BUPIVACAINE-EPINEPHRINE 0.5% -1:200000 IJ SOLN
INTRAMUSCULAR | Status: DC | PRN
  Administered 2024-06-04: 30 mL

## 2024-06-04 MED ORDER — ONDANSETRON HCL 4 MG/2ML IJ SOLN: 4.0000 mg | Freq: Once | INTRAMUSCULAR | Status: DC | PRN

## 2024-06-04 MED ORDER — CEFAZOLIN SODIUM-DEXTROSE 2-3 GM-%(50ML) IV SOLR
INTRAVENOUS | Status: DC | PRN
  Administered 2024-06-04: 2 g via INTRAVENOUS

## 2024-06-04 MED ORDER — PROPOFOL 10 MG/ML IV BOLUS
INTRAVENOUS | Status: AC
  Filled 2024-06-04: qty 20

## 2024-06-04 MED ORDER — 0.9 % SODIUM CHLORIDE (POUR BTL) OPTIME
TOPICAL | Status: DC | PRN
  Administered 2024-06-04: 1000 mL

## 2024-06-04 MED ORDER — DEXAMETHASONE SODIUM PHOSPHATE 10 MG/ML IJ SOLN
INTRAMUSCULAR | Status: AC
  Filled 2024-06-04: qty 1

## 2024-06-04 MED ORDER — ONDANSETRON HCL 4 MG/2ML IJ SOLN
INTRAMUSCULAR | Status: AC
  Filled 2024-06-04: qty 2

## 2024-06-04 MED ORDER — SODIUM CHLORIDE 0.9 % IR SOLN
Status: DC | PRN
  Administered 2024-06-04: 3000 mL
  Administered 2024-06-04: 1000 mL

## 2024-06-04 MED ORDER — MENTHOL 3 MG MT LOZG: 1.0000 | LOZENGE | OROMUCOSAL | Status: DC | PRN

## 2024-06-04 MED ORDER — PHENOL 1.4 % MT LIQD
1.0000 | OROMUCOSAL | Status: DC | PRN
Start: 2024-06-04 — End: 2024-06-07

## 2024-06-04 MED ORDER — CEFAZOLIN SODIUM-DEXTROSE 2-4 GM/100ML-% IV SOLN
2.0000 g | Freq: Four times a day (QID) | INTRAVENOUS | Status: AC
  Administered 2024-06-04 – 2024-06-05 (×2): 2 g via INTRAVENOUS
  Filled 2024-06-04 (×2): qty 100

## 2024-06-04 MED ORDER — SUGAMMADEX SODIUM 200 MG/2ML IV SOLN
INTRAVENOUS | Status: DC | PRN
  Administered 2024-06-04 (×2): 100 mg via INTRAVENOUS

## 2024-06-04 MED ORDER — KETOROLAC TROMETHAMINE 30 MG/ML IJ SOLN
INTRAMUSCULAR | Status: DC | PRN
  Administered 2024-06-04: 30 mg via INTRAMUSCULAR

## 2024-06-04 MED ORDER — DEXAMETHASONE SODIUM PHOSPHATE 10 MG/ML IJ SOLN
INTRAMUSCULAR | Status: DC | PRN
  Administered 2024-06-04: 4 mg via INTRAVENOUS

## 2024-06-04 MED ORDER — PRONTOSAN WOUND IRRIGATION OPTIME
TOPICAL | Status: DC | PRN
  Administered 2024-06-04: 1

## 2024-06-04 MED ORDER — LACTATED RINGERS IV SOLN: INTRAVENOUS | Status: DC

## 2024-06-04 MED ORDER — METOCLOPRAMIDE HCL 5 MG PO TABS: 5.0000 mg | ORAL_TABLET | Freq: Three times a day (TID) | ORAL | Status: DC | PRN

## 2024-06-04 MED ORDER — FENTANYL CITRATE (PF) 250 MCG/5ML IJ SOLN
INTRAMUSCULAR | Status: AC
  Filled 2024-06-04: qty 5

## 2024-06-04 MED ORDER — KETOROLAC TROMETHAMINE 30 MG/ML IJ SOLN
INTRAMUSCULAR | Status: AC
  Filled 2024-06-04: qty 1

## 2024-06-04 MED ORDER — CHLORHEXIDINE GLUCONATE 0.12 % MT SOLN
15.0000 mL | Freq: Once | OROMUCOSAL | Status: AC
Start: 2024-06-04 — End: 2024-06-04

## 2024-06-04 MED ORDER — FENTANYL CITRATE (PF) 250 MCG/5ML IJ SOLN
INTRAMUSCULAR | Status: DC | PRN
  Administered 2024-06-04 (×2): 25 ug via INTRAVENOUS

## 2024-06-04 MED ORDER — ORAL CARE MOUTH RINSE: 15.0000 mL | Freq: Once | OROMUCOSAL | Status: AC

## 2024-06-04 MED ORDER — APIXABAN 2.5 MG PO TABS
2.5000 mg | ORAL_TABLET | Freq: Two times a day (BID) | ORAL | Status: DC
  Administered 2024-06-05 – 2024-06-07 (×5): 2.5 mg via ORAL
  Filled 2024-06-04 (×5): qty 1

## 2024-06-04 MED ORDER — OXYCODONE HCL 5 MG PO TABS: 5.0000 mg | ORAL_TABLET | Freq: Once | ORAL | Status: DC | PRN

## 2024-06-04 MED ORDER — ROCURONIUM BROMIDE 10 MG/ML (PF) SYRINGE
PREFILLED_SYRINGE | INTRAVENOUS | Status: AC
  Filled 2024-06-04: qty 10

## 2024-06-04 SURGICAL SUPPLY — 68 items
ALCOHOL 70% 16 OZ (MISCELLANEOUS) ×1 IMPLANT
BAG COUNTER SPONGE SURGICOUNT (BAG) ×2 IMPLANT
BLADE CLIPPER SURG (BLADE) IMPLANT
BLADE SAW RECIP 87.9 MT (BLADE) IMPLANT
BLADE SAW SGTL 73X25 THK (BLADE) ×1 IMPLANT
BLADE SURG 10 STRL SS (BLADE) IMPLANT
BRUSH SCRUB EZ PLAIN DRY (MISCELLANEOUS) ×2 IMPLANT
CHLORAPREP W/TINT 26 (MISCELLANEOUS) ×1 IMPLANT
COVER SURGICAL LIGHT HANDLE (MISCELLANEOUS) ×2 IMPLANT
DERMABOND ADVANCED .7 DNX12 (GAUZE/BANDAGES/DRESSINGS) ×2 IMPLANT
DERMABOND ADVANCED .7 DNX6 (GAUZE/BANDAGES/DRESSINGS) IMPLANT
DRAPE 3/4 80X56 (DRAPES) ×3 IMPLANT
DRAPE C-ARM 42X72 X-RAY (DRAPES) ×1 IMPLANT
DRAPE HALF SHEET 40X57 (DRAPES) IMPLANT
DRAPE INCISE IOBAN 85X60 (DRAPES) ×1 IMPLANT
DRAPE STERI IOBAN 125X83 (DRAPES) ×1 IMPLANT
DRAPE SURG ORHT 6 SPLT 77X108 (DRAPES) ×2 IMPLANT
DRAPE U-SHAPE 47X51 STRL (DRAPES) ×4 IMPLANT
DRESSING AQUACEL AG SP 3.5X10 (GAUZE/BANDAGES/DRESSINGS) IMPLANT
DRSG AQUACEL AG ADV 3.5X10 (GAUZE/BANDAGES/DRESSINGS) ×1 IMPLANT
DRSG MEPILEX POST OP 4X8 (GAUZE/BANDAGES/DRESSINGS) ×1 IMPLANT
ELECT PENCIL ROCKER SW 15FT (MISCELLANEOUS) ×1 IMPLANT
ELECTRODE BLDE 4.0 EZ CLN MEGD (MISCELLANEOUS) ×1 IMPLANT
ELECTRODE REM PT RTRN 9FT ADLT (ELECTROSURGICAL) ×2 IMPLANT
GLOVE BIO SURGEON STRL SZ7 (GLOVE) IMPLANT
GLOVE BIO SURGEON STRL SZ7.5 (GLOVE) ×2 IMPLANT
GLOVE BIO SURGEON STRL SZ8 (GLOVE) ×1 IMPLANT
GLOVE BIO SURGEON STRL SZ8.5 (GLOVE) ×2 IMPLANT
GLOVE BIOGEL PI IND STRL 7.5 (GLOVE) ×1 IMPLANT
GLOVE BIOGEL PI IND STRL 8 (GLOVE) ×2 IMPLANT
GLOVE BIOGEL PI IND STRL 8.5 (GLOVE) ×1 IMPLANT
GLOVE SURG ORTHO LTX SZ7.5 (GLOVE) ×2 IMPLANT
GOWN STRL REUS W/ TWL LRG LVL3 (GOWN DISPOSABLE) ×1 IMPLANT
GOWN STRL REUS W/ TWL XL LVL3 (GOWN DISPOSABLE) ×2 IMPLANT
GOWN STRL REUS W/TWL 2XL LVL3 (GOWN DISPOSABLE) ×1 IMPLANT
HEAD MOD COCR 28MM HD -6MM NK (Orthopedic Implant) IMPLANT
HOOD PEEL AWAY T7 (MISCELLANEOUS) ×2 IMPLANT
KIT BASIN OR (CUSTOM PROCEDURE TRAY) ×2 IMPLANT
KIT TURNOVER KIT B (KITS) ×2 IMPLANT
MANIFOLD NEPTUNE II (INSTRUMENTS) ×2 IMPLANT
MARKER SKIN DUAL TIP RULER LAB (MISCELLANEOUS) ×2 IMPLANT
NDL SPNL 18GX3.5 QUINCKE PK (NEEDLE) ×1 IMPLANT
NEEDLE SPNL 18GX3.5 QUINCKE PK (NEEDLE) ×1 IMPLANT
NS IRRIG 1000ML POUR BTL (IV SOLUTION) ×2 IMPLANT
PACK ANTERIOR HIP CUSTOM (KITS) ×1 IMPLANT
PACK TOTAL JOINT (CUSTOM PROCEDURE TRAY) ×1 IMPLANT
PAD ARMBOARD POSITIONER FOAM (MISCELLANEOUS) ×4 IMPLANT
RETRIEVER SUT HEWSON (MISCELLANEOUS) ×1 IMPLANT
SAW OSC TIP CART 19.5X105X1.3 (SAW) ×1 IMPLANT
SEALER BIPOLAR AQUA 6.0 (INSTRUMENTS) IMPLANT
SET HNDPC FAN SPRY TIP SCT (DISPOSABLE) ×1 IMPLANT
SHELL RINGBLOC BI POLR 28X47MM (Orthopedic Implant) IMPLANT
SOLUTION PRONTOSAN WOUND 350ML (IRRIGATION / IRRIGATOR) ×1 IMPLANT
STAPLER SKIN PROX 35W (STAPLE) ×1 IMPLANT
STEM FEMORAL 17X154 (Stem) IMPLANT
SUT ETHILON 2 0 PSLX (SUTURE) ×2 IMPLANT
SUT MON AB 2-0 CT1 36 (SUTURE) ×1 IMPLANT
SUT VIC AB 1 CT1 18XCR BRD 8 (SUTURE) ×1 IMPLANT
SUT VIC AB 1 CT1 27XBRD ANBCTR (SUTURE) ×2 IMPLANT
SUT VIC AB 2-0 CT1 TAPERPNT 27 (SUTURE) ×3 IMPLANT
SUTURE FIBERWR #2 38 T-5 BLUE (SUTURE) ×2 IMPLANT
SUTURE STRATFX 0 PDS 27 VIOLET (SUTURE) ×1 IMPLANT
SYR 50ML LL SCALE MARK (SYRINGE) ×1 IMPLANT
TOWEL GREEN STERILE (TOWEL DISPOSABLE) ×2 IMPLANT
TOWEL GREEN STERILE FF (TOWEL DISPOSABLE) ×2 IMPLANT
TUBE SUCT ARGYLE STRL (TUBING) IMPLANT
TUBE SUCTION HIGH CAP CLEAR NV (SUCTIONS) ×1 IMPLANT
WATER STERILE IRR 1000ML POUR (IV SOLUTION) ×4 IMPLANT

## 2024-06-04 NOTE — TOC CAGE-AID Note (Signed)
 Transition of Care [facility]) - CAGE-AID Screening   Patient Details  Name: AMYRIE ILLINGWORTH MRN: [id number] Date of Birth: 1928-04-18  Transition of Care [facility]) CM/SW Contact:    Kathryn Cosby E Jolana Runkles, LCSW Phone Number: 06/04/2024, 9:41 AM   Clinical Narrative: No SA noted.   CAGE-AID Screening:    Have You Ever Felt You Ought to Cut Down on Your Drinking or Drug Use?: No Have People Annoyed You By Critizing Your Drinking Or Drug Use?: No Have You Felt Bad Or Guilty About Your Drinking Or Drug Use?: No Have You Ever Had a Drink or Used Drugs First Thing In The Morning to Steady Your Nerves or to Get Rid of a Hangover?: No CAGE-AID Score: 0  Substance Abuse Education Offered: No

## 2024-06-04 NOTE — Progress Notes (Signed)
  Progress Note   Patient: Sheila Jordan FMW:[id number] DOB: 1927-12-07 DOA: 06/02/2024     2 DOS: the patient was seen and examined on 06/04/2024   Brief hospital course: The patient is a 89 yr old woman who tripped and fell in her yard while spraying some week killer. She had immediate pain in her left hip and could not get up. In th eEd she was found to have a left hip fracture. Orthopedic surgery was consulted. The plan is for the patient to go for ORIF later today.  She carries a past medical history significant for arthritis, chronic diastolic heart failulre, CKD IIIb, History of CVA, hypertension, PAF.  Assessment and Plan: Left hip fracture  - Based on the available data, Mrs. Kulik presents an estimated 1.1% risk of perioperative MI or cardiac arrest; she appears optimized for surgery and no further preoperative cardiac evaluation is indicated   - Continue pain-control and supportive care, hold Eliquis  (last dose was AM of 8/27), keep NPO after midnight   - Plan is for ORIF later today.   2. PAF  - Rate is controlled on metoprolol  and sotalol . Both have been held. - Stroke prophylaxis on eliquis .This has also been held.    3. Chronic diastolic CHF  - Appears compensated  - Hold Lasix  while NPO, monitor volume status    4. Hypertension  - Continue hydralazine      5. CKD 3B  - Appears to be at baseline  - Renally-dose medications     6. Hx of CVA  - Hold Eliquis  in anticipation of surgery      DVT prophylaxis: SCDs, last took Eliquis  the morning of 8/27 Code Status: Full perioperatively  Level of Care: Level of care: Med-Surg Family Communication: Son at bedside  Disposition Plan:  Patient is from: home  Anticipated d/c is to: TBD Anticipated d/c date is: 06/06/24  Patient currently: pending orthopedic surgery consultation and likely operative hip repair  Consults called: Orthopedic surgery        Subjective: The patient is resting comfortably. No new  complaints.   Physical Exam: Vitals:   06/04/24 1745 06/04/24 1800 06/04/24 1805 06/04/24 1831  BP: (!) 147/58 132/61 132/61 (!) 148/53  Pulse: 60 (!) 52 60 (!) 58  Resp: 15 17 18 19   Temp:   98.1 F (36.7 C) 97.8 F (36.6 C)  TempSrc:    Oral  SpO2: 92% 90% 91% 92%   Exam:  Constitutional:  The patient is awake, alert, and oriented x 3. No acute distress. Respiratory:  No increased work of breathing. No wheezes, rales, or rhonchi No tactile fremitus Cardiovascular:  Regular rate and rhythm No murmurs, ectopy, or gallups. No lateral PMI. No thrills. Abdomen:  Abdomen is soft, non-tender, non-distended No hernias, masses, or organomegaly Normoactive bowel sounds.  Musculoskeletal:  No cyanosis, clubbing, or edema Skin:  No rashes, lesions, ulcers palpation of skin: no induration or nodules Psychiatric:  Mental status Mood, affect appropriate Orientation to person, place, time  judgment and insight appear intact  Data Reviewed:  CBC BMP X-ray hip  Family Communication: Family is at bedside.  Disposition: Status is: Inpatient Remains inpatient appropriate because: Pending ORIF of the hip.  Planned Discharge Destination: Rehab    Time spent: 32 minutes  Author: Kellis Topete, DO 06/04/2024 6:45 PM  For on call review [email or web address].

## 2024-06-04 NOTE — Anesthesia Preprocedure Evaluation (Addendum)
 Anesthesia Evaluation  Patient identified by MRN, date of birth, ID band Patient awake    Reviewed: Allergy & Precautions, NPO status , Patient's Chart, lab work & pertinent test results, reviewed documented beta blocker date and time   History of Anesthesia Complications Negative for: history of anesthetic complications  Airway Mallampati: II  TM Distance: >3 FB Neck ROM: Full    Dental no notable dental hx. (+) Teeth Intact   Pulmonary neg pulmonary ROS, neg sleep apnea, neg COPD, Patient abstained from smoking.Not current smoker   Pulmonary exam normal breath sounds clear to auscultation       Cardiovascular Exercise Tolerance: Good METShypertension (119/59 preop), Pt. on medications and Pt. on home beta blockers pulmonary hypertension (mildly elevated pHTN)+CHF (normal LVEF, mild diastolic dysfunction)  (-) CAD and (-) Past MI + dysrhythmias (eliquis  LD 8/27 AM) Atrial Fibrillation + Valvular Problems/Murmurs (Mild) MR  Rhythm:Irregular Rate:Normal - Systolic murmurs 7976 TTE 1. Left ventricular ejection fraction, by estimation, is 50 to 55%. The  left ventricle has low normal function. The left ventricle has no regional  wall motion abnormalities. Left ventricular diastolic parameters are  consistent with Grade II diastolic  dysfunction (pseudonormalization). Elevated left ventricular end-diastolic  pressure.   2. Right ventricular systolic function is normal. The right ventricular  size is normal. There is mildly elevated pulmonary artery systolic  pressure.   3. Left atrial size was severely dilated.   4. The mitral valve is normal in structure. Mild mitral valve  regurgitation. No evidence of mitral stenosis.   5. The aortic valve is tricuspid. Aortic valve regurgitation is not  visualized. No aortic stenosis is present.   6. Pulmonic valve regurgitation is moderate.   7. The inferior vena cava is normal in size with  greater than 50%  respiratory variability, suggesting right atrial pressure of 3 mmHg.     Neuro/Psych CVA, No Residual Symptoms  negative psych ROS   GI/Hepatic Neg liver ROS,GERD  Controlled and Medicated,,  Endo/Other  negative endocrine ROSneg diabetes    Renal/GU Renal InsufficiencyRenal diseaseLab Results      Component                Value               Date                      NA                       137                 06/04/2024                CL                       104                 06/04/2024                K                        4.3                 06/04/2024                CO2  21 (L)              06/04/2024                BUN                      19                  06/04/2024                CREATININE               1.33 (H)            06/04/2024                GFRNONAA                 37 (L)              06/04/2024                CALCIUM                  8.6 (L)             06/04/2024                ALBUMIN                  4.3                 05/04/2024                GLUCOSE                  110 (H)             06/04/2024             negative genitourinary   Musculoskeletal  (+) Arthritis , Osteoarthritis,  L femur fx   Abdominal   Peds  Hematology  (+) Blood dyscrasia (eliquis  LD Wed 06/02/24) Lab Results      Component                Value               Date                      WBC                      9.4                 06/04/2024                HGB                      12.5                06/04/2024                HCT                      37.7                06/04/2024                MCV                      90.8  06/04/2024                PLT                      206                 06/04/2024              Anesthesia Other Findings Past Medical History: No date: Arthritis No date: Atrial fibrillation (HCC) 08/30/2022: Chronic diastolic heart failure (HCC) 06/02/2024: CKD stage 3b, GFR 30-44 ml/min  (HCC) No date: Dysrhythmia 08/30/2022: History of stroke No date: Hypertension 08/30/2022: PAF (paroxysmal atrial fibrillation) (HCC) No date: Stroke (HCC)  Reproductive/Obstetrics negative OB ROS                              Anesthesia Physical Anesthesia Plan  ASA: 3  Anesthesia Plan: General   Post-op Pain Management: Tylenol  PO (pre-op)*   Induction: Intravenous  PONV Risk Score and Plan: 3 and Ondansetron , Treatment may vary due to age or medical condition and Dexamethasone   Airway Management Planned: Oral ETT  Additional Equipment: None  Intra-op Plan:   Post-operative Plan: Extubation in OR  Informed Consent: I have reviewed the patients History and Physical, chart, labs and discussed the procedure including the risks, benefits and alternatives for the proposed anesthesia with the patient or authorized representative who has indicated his/her understanding and acceptance.     Dental advisory given  Plan Discussed with: CRNA and Surgeon  Anesthesia Plan Comments: (On high dose eliquis  (5mg  BID)- has not been off for 72h, not a candidate for neuraxial   Discussed risks of anesthesia with patient and adult children at bedside, including PONV, sore throat, lip/dental/eye damage, post operative cognitive dysfunction. Rare risks discussed as well, such as cardiorespiratory and neurological sequelae, and allergic reactions. Discussed the role of CRNA in patient's perioperative care. Patient understands.)         Anesthesia Quick Evaluation

## 2024-06-04 NOTE — Op Note (Signed)
 OPERATIVE REPORT  SURGEON: Redell Shoals, MD   ASSISTANT: Valery Potters, PA-C  PREOPERATIVE DIAGNOSIS: Displaced Left femoral neck fracture.   POSTOPERATIVE DIAGNOSIS: Displaced Left femoral neck fracture.   PROCEDURE: Left hip hemiarthroplasty, anterior approach.   IMPLANTS: Biomet Taperloc Complete Reduced Distal stem, size 17 x 154 mm, high offset, with a 28 - 6 mm taper adapter and a 28x47 mm bipolar head ball.  ANESTHESIA:  General  ANTIBIOTICS: 2g ancef .  ESTIMATED BLOOD LOSS:-200 mL    DRAINS: None.  COMPLICATIONS: None   CONDITION: PACU - hemodynamically stable.   BRIEF CLINICAL NOTE: Sheila Jordan is a 89 y.o. female with a displaced Left femoral neck fracture. The patient was admitted to the hospitalist service and underwent perioperative risk stratification and medical optimization. The risks, benefits, and alternatives to hemiarthroplasty were explained, and the patient elected to proceed.  PROCEDURE IN DETAIL: The patient was taken to the operating room and general anesthesia was induced on the hospital bed.  The patient was then positioned on the Hana table.  All bony prominences were well padded.  The hip was prepped and draped in the normal sterile surgical fashion.  A time-out was called verifying side and site of surgery. Antibiotics were given within 60 minutes of beginning the procedure.   Bikini incision was made, and the direct anterior approach to the hip was performed through the Hueter interval.  Superficial dissection was carried out lateral to the ASIS. Lateral femoral circumflex vessels were treated with the Auqumantys. The anterior capsule was exposed and an inverted T capsulotomy was made. Fracture hematoma was encountered and evacuated. The patient was found to have a comminuted Left subcapital femoral neck fracture.  Inferior pubofemoral ligament was released subperiosteally to the lesser trochanter. I freshened the femoral neck cut with a saw.  I  removed the femoral neck fragment.  A corkscrew was placed into the head and the head was removed.  This was passed to the back table and was measured.   Acetabular exposure was achieved.  I examined the articular cartilage which was intact.  The labrum was intact. A 47 mm trial head was placed and found to have excellent fit.   I then gained femoral exposure taking care to protect the abductors and greater trochanter.  The superior capsule was incised longitudinally, staying lateral to the posterior border of the femoral neck. External rotation, extension, and adduction were applied.  A cookie cutter was used to enter the femoral canal, and then the femoral canal finder was used to confirm location.  I then sequentially broached up to a size high.  Calcar planer was used on the femoral neck remnant.  I placed a high offset neck and a bipolar trial head ball. The hip was reduced.  Leg lengths were checked fluoroscopically.  The hip was dislocated and trial components were removed.  I placed the real stem followed by the real bipolar articulation.  A single reduction maneuver was performed and the hip was reduced.  Fluoroscopy was used to confirm component position and leg lengths.  At 90 degrees of external rotation and extension, the hip was stable to an anterior directed force.   The wound was copiously irrigated with Irrisept solution and normal saline using pulse lavage.  Marcaine  solution was injected into the periarticular soft tissue.  The wound was closed in layers using #1 Stratafix for the fascia, 2-0 Vicryl for the subcutaneous fat, 2-0 Monocryl for the deep dermal layer, and staples + Dermabond for  the skin.  Once the glue was fully dried, an Aquacell Ag dressing was applied.  The patient was then awakened from anesthesia and transported to the recovery room in stable condition.  Sponge, needle, and instrument counts were correct at the end of the case x2.  The patient tolerated the procedure well  and there were no known complications.  Please note that a surgical assistant was a medical necessity for this procedure to perform it in a safe and expeditious manner. Assistant was necessary to provide appropriate retraction of vital neurovascular structures, to prevent femoral fracture, and to allow for anatomic placement of the prosthesis.

## 2024-06-04 NOTE — Transfer of Care (Signed)
 Immediate Anesthesia Transfer of Care Note  Patient: Sheila Jordan  Procedure(s) Performed: HEMIARTHROPLASTY (BIPOLAR) HIP, ANTERIOR  APPROACH FOR FRACTURE (Left)  Patient Location: PACU  Anesthesia Type:General  Level of Consciousness: awake  Airway & Oxygen Therapy: Patient Spontanous Breathing and Patient connected to nasal cannula oxygen  Post-op Assessment: Report given to RN and Post -op Vital signs reviewed and stable  Post vital signs: Reviewed and stable  Last Vitals:  Vitals Value Taken Time  BP 144/57 06/04/24 17:35  Temp    Pulse 55 06/04/24 17:37  Resp 10 06/04/24 17:37  SpO2 94 % 06/04/24 17:37  Vitals shown include unfiled device data.  Last Pain:  Vitals:   06/04/24 1047  TempSrc:   PainSc: 3       Patients Stated Pain Goal: 0 (06/03/24 2008)  Complications: No notable events documented.

## 2024-06-04 NOTE — Progress Notes (Signed)
 Orthopedic Tech Progress Note Patient Details:  Sheila Jordan Apr 15, 1928 [id number]  Patient ID: Sheila Jordan, female   DOB: November 12, 1927, 89 y.o.   MRN: [id number] No OHF. Age restricted. [name] 06/04/2024, 6:36 PM

## 2024-06-04 NOTE — Anesthesia Procedure Notes (Signed)
 Procedure Name: Intubation Date/Time: 06/04/2024 3:15 PM  Performed by: Nestor Wieneke A, CRNAPre-anesthesia Checklist: Patient identified, Emergency Drugs available, Suction available and Patient being monitored Patient Re-evaluated:Patient Re-evaluated prior to induction Oxygen Delivery Method: Circle System Utilized Preoxygenation: Pre-oxygenation with 100% oxygen Induction Type: IV induction Ventilation: Mask ventilation without difficulty Laryngoscope Size: Mac and 3 Grade View: Grade I Tube type: Oral Tube size: 7.0 mm Number of attempts: 1 Airway Equipment and Method: Stylet and Oral airway Placement Confirmation: ETT inserted through vocal cords under direct vision, positive ETCO2 and breath sounds checked- equal and bilateral Secured at: 21 cm Tube secured with: Tape Dental Injury: Teeth and Oropharynx as per pre-operative assessment

## 2024-06-04 NOTE — Progress Notes (Signed)
 Received patient from PACU. Patient alert and oriented (x) 4, on 2L oxygen nasal cannula. No complaints of pain, surgical dressing clean, dry and intact and VS's stable. Patient offered but declined food and drink.

## 2024-06-04 NOTE — Consult Note (Signed)
 ORTHOPAEDIC CONSULTATION  REQUESTING PHYSICIAN: Swayze, Ava, DO  PCP:  Sheryle Carwin, MD  Chief Complaint: left hip fracture  HPI: Sheila Jordan is a 89 y.o. female who complains of left hip pain, inability to weight bear.  She was out in her yard spraying some weed killer when she tripped and fell. She had immediate left hip pain and could not get up. She was brought to the ED where x-rays showed a left hip fx and orthopedic surgery was consulted. She lives at home alone and does not use any assistive devices to ambulate.   Past Medical History:  Diagnosis Date   Arthritis    Atrial fibrillation (HCC)    Chronic diastolic heart failure (HCC) 08/30/2022   CKD stage 3b, GFR 30-44 ml/min (HCC) 06/02/2024   Dysrhythmia    History of stroke 08/30/2022   Hypertension    PAF (paroxysmal atrial fibrillation) (HCC) 08/30/2022   Stroke [facility])    Past Surgical History:  Procedure Laterality Date   APPENDECTOMY     CHOLECYSTECTOMY     IR CT HEAD LTD  08/26/2022   IR PERCUTANEOUS ART THROMBECTOMY/INFUSION INTRACRANIAL INC DIAG ANGIO  08/26/2022   IR US  GUIDE VASC ACCESS RIGHT  08/26/2022   RADIOLOGY WITH ANESTHESIA N/A 08/26/2022   Procedure: RADIOLOGY WITH ANESTHESIA;  Surgeon: Radiologist, Medication, MD;  Location: [facility] OR;  Service: Radiology;  Laterality: N/A;   REVERSE SHOULDER ARTHROPLASTY Right 06/19/2020   Procedure: REVERSE TOTAL SHOULDER ARTHROPLASTY;  Surgeon: Cristy Bonner DASEN, MD;  Location: [facility] OR;  Service: Orthopedics;  Laterality: Right;   Social History   Socioeconomic History   Marital status: Widowed    Spouse name: Not on file   Number of children: Not on file   Years of education: Not on file   Highest education level: Not on file  Occupational History   Not on file  Tobacco Use   Smoking status: Never   Smokeless tobacco: Never  Vaping Use   Vaping status: Never Used  Substance and Sexual Activity   Alcohol use: No   Drug use: No   Sexual activity: Not  Currently    Birth control/protection: Post-menopausal  Other Topics Concern   Not on file  Social History Narrative   Retired Tourist information centre manager   Social Drivers of Health   Financial Resource Strain: Not on file  Food Insecurity: No Food Insecurity (08/27/2022)   Hunger Vital Sign    Worried About Running Out of Food in the Last Year: Never true    Ran Out of Food in the Last Year: Never true  Transportation Needs: No Transportation Needs (08/27/2022)   PRAPARE - Administrator, Civil Service (Medical): No    Lack of Transportation (Non-Medical): No  Physical Activity: Not on file  Stress: Not on file  Social Connections: Not on file   Family History  Problem Relation Age of Onset   Other Father        heart condition   Parkinson's disease Mother    Breast cancer Daughter    No Known Allergies Prior to Admission medications   Medication Sig Start Date End Date Taking? Authorizing Provider  acetaminophen  (TYLENOL ) 500 MG tablet Take 1,000 mg by mouth 2 (two) times daily as needed for moderate pain (pain score 4-6), fever or headache.   Yes [provider]  apixaban  (ELIQUIS ) 5 MG TABS tablet Take 1 tablet (5 mg total) by mouth 2 (two) times daily. 03/17/24  Yes Mallipeddi, Vishnu P, MD  furosemide  (LASIX ) 20 MG tablet Take 1 tablet (20 mg total) by mouth daily. May take an extra 20 mg for weight gain of 2 lbs overnight or 5 lbs in a week. 02/03/24  Yes Parthenia Olivia HERO, PA-C  hydrALAZINE  (APRESOLINE ) 25 MG tablet Take 1 tablet (25 mg total) by mouth in the morning and at bedtime. 12/25/23  Yes Mallipeddi, Vishnu P, MD  metoCLOPramide  (REGLAN ) 10 MG tablet Take 1 tablet (10 mg total) by mouth daily as needed for up to 6 doses for nausea. 05/04/24  Yes Cottie Donnice PARAS, MD  metoprolol  succinate (TOPROL  XL) 25 MG 24 hr tablet Take 1.5 tablets (37.5 mg total) by mouth daily. May take an extra 1/2 tablet for palpitations 02/03/24  Yes Parthenia Olivia HERO, PA-C   pantoprazole  (PROTONIX ) 40 MG tablet Take 1 tablet (40 mg total) by mouth daily at 12 noon. 03/22/24  Yes Mallipeddi, Vishnu P, MD  Potassium Chloride  ER 20 MEQ TBCR Take 1 tablet (20 mEq total) by mouth daily. 12/01/23  Yes Mallipeddi, Vishnu P, MD  sotalol  (BETAPACE ) 80 MG tablet Take 0.5 tablets (40 mg total) by mouth 2 (two) times daily. 05/12/24  Yes Mallipeddi, Diannah SQUIBB, MD   DG Knee Left Port Result Date: 06/03/2024 CLINICAL DATA:  Femoral neck fracture. EXAM: PORTABLE LEFT KNEE - 1-2 VIEW COMPARISON:  None Available. FINDINGS: No acute fracture of the left knee. The bones are subjectively under mineralized. The alignment is normal. Moderate osteoarthritis with tricompartmental joint space narrowing and spurring. Trace joint effusion. Peripheral vascular calcifications are seen. IMPRESSION: 1. No acute fracture of the left knee. 2. Moderate osteoarthritis. Electronically Signed   By: Andrea Gasman M.D.   On: 06/03/2024 09:47   CT Head Wo Contrast Result Date: 06/02/2024 CLINICAL DATA:  Head trauma EXAM: CT HEAD WITHOUT CONTRAST CT CERVICAL SPINE WITHOUT CONTRAST TECHNIQUE: Multidetector CT imaging of the head and cervical spine was performed following the standard protocol without intravenous contrast. Multiplanar CT image reconstructions of the cervical spine were also generated. RADIATION DOSE REDUCTION: This exam was performed according to the departmental dose-optimization program which includes automated exposure control, adjustment of the mA and/or kV according to patient size and/or use of iterative reconstruction technique. COMPARISON:  CT brain 05/04/2024, 04/26/2024, MRI 08/26/2022, CT thoracic spine 09/12/2022 FINDINGS: CT HEAD FINDINGS Brain: No acute territorial infarction, hemorrhage or intracranial mass. Mild atrophy. Mild to moderate chronic small vessel ischemic changes in the white matter. The ventricles are nonenlarged Vascular: No hyperdense vessels. Vertebral and carotid vascular  calcification Skull: Normal. Negative for fracture or focal lesion. Sinuses/Orbits: No acute finding. Other: Small left forehead scalp soft tissue swelling. CT CERVICAL SPINE FINDINGS Alignment: Facet alignment is within normal limits. 7 mm anterior displacement tip of dens with respect to the clivus, but chronic compared with brain MRI from 2023. trace anterolisthesis C6 on C7 and C7 on T1. Skull base and vertebrae: No fracture of the cervical vertebral bodies. Moderate compression fracture T1, age indeterminate, appears new compared with 2023 thoracic CT. Mild to moderate T3 compression fracture age indeterminate, also appears new compared with 2023 CT. Soft tissues and spinal canal: No prevertebral fluid or swelling. No visible canal hematoma. Disc levels: Advanced C1-C2 degenerative change. Masslike probable inflammatory pannus posterior to the dens, also chronic. No high-grade bony canal stenosis. Relatively patent disc spaces. Hypertrophic facet degenerative changes at multiple levels with bilateral foraminal narrowing. Upper chest: No acute finding Other: None IMPRESSION: 1. No CT  evidence for acute intracranial abnormality. Atrophy and chronic small vessel ischemic changes of the white matter. 2. No acute osseous abnormality of the cervical spine. 3. Moderate compression fracture of T1 and mild to moderate compression fracture of T3, age indeterminate, but appear new compared with 2023 CT. Electronically Signed   By: Luke Bun M.D.   On: 06/02/2024 21:23   CT Cervical Spine Wo Contrast Result Date: 06/02/2024 CLINICAL DATA:  Head trauma EXAM: CT HEAD WITHOUT CONTRAST CT CERVICAL SPINE WITHOUT CONTRAST TECHNIQUE: Multidetector CT imaging of the head and cervical spine was performed following the standard protocol without intravenous contrast. Multiplanar CT image reconstructions of the cervical spine were also generated. RADIATION DOSE REDUCTION: This exam was performed according to the departmental  dose-optimization program which includes automated exposure control, adjustment of the mA and/or kV according to patient size and/or use of iterative reconstruction technique. COMPARISON:  CT brain 05/04/2024, 04/26/2024, MRI 08/26/2022, CT thoracic spine 09/12/2022 FINDINGS: CT HEAD FINDINGS Brain: No acute territorial infarction, hemorrhage or intracranial mass. Mild atrophy. Mild to moderate chronic small vessel ischemic changes in the white matter. The ventricles are nonenlarged Vascular: No hyperdense vessels. Vertebral and carotid vascular calcification Skull: Normal. Negative for fracture or focal lesion. Sinuses/Orbits: No acute finding. Other: Small left forehead scalp soft tissue swelling. CT CERVICAL SPINE FINDINGS Alignment: Facet alignment is within normal limits. 7 mm anterior displacement tip of dens with respect to the clivus, but chronic compared with brain MRI from 2023. trace anterolisthesis C6 on C7 and C7 on T1. Skull base and vertebrae: No fracture of the cervical vertebral bodies. Moderate compression fracture T1, age indeterminate, appears new compared with 2023 thoracic CT. Mild to moderate T3 compression fracture age indeterminate, also appears new compared with 2023 CT. Soft tissues and spinal canal: No prevertebral fluid or swelling. No visible canal hematoma. Disc levels: Advanced C1-C2 degenerative change. Masslike probable inflammatory pannus posterior to the dens, also chronic. No high-grade bony canal stenosis. Relatively patent disc spaces. Hypertrophic facet degenerative changes at multiple levels with bilateral foraminal narrowing. Upper chest: No acute finding Other: None IMPRESSION: 1. No CT evidence for acute intracranial abnormality. Atrophy and chronic small vessel ischemic changes of the white matter. 2. No acute osseous abnormality of the cervical spine. 3. Moderate compression fracture of T1 and mild to moderate compression fracture of T3, age indeterminate, but appear new  compared with 2023 CT. Electronically Signed   By: Luke Bun M.D.   On: 06/02/2024 21:23   DG Hip Unilat W or Wo Pelvis 2-3 Views Left Result Date: 06/02/2024 CLINICAL DATA:  Fall and left-sided rib pain. EXAM: DG HIP (WITH OR WITHOUT PELVIS) 2-3V LEFT COMPARISON:  None Available. FINDINGS: There is a displaced fracture of the left femoral neck with slight proximal migration and impaction of the femoral shaft of the femoral head. No dislocation. Old fractures of the right pubic bone. The soft tissues are unremarkable. IMPRESSION: Displaced fracture of the left femoral neck. Electronically Signed   By: Vanetta Chou M.D.   On: 06/02/2024 20:27    Positive ROS: All other systems have been reviewed and were otherwise negative with the exception of those mentioned in the HPI and as above.  Physical Exam: General: Alert, no acute distress Cardiovascular: No pedal edema Respiratory: No cyanosis, no use of accessory musculature GI: No organomegaly, abdomen is soft and non-tender Skin: No lesions in the area of chief complaint Neurologic: Sensation intact distally Psychiatric: Patient is competent for consent with normal  mood and affect Lymphatic: No axillary or cervical lymphadenopathy  MUSCULOSKELETAL: L hip skin intact. Shortened and externally rotated.  (+) TA, GS, EHL. SILT.  2+ DP.  Assessment: Displaced left femoral neck fracture. CKD 3b. AFib on Eliquis . H/o CVA.  Plan: I discussed the findings with the patient and family. Recommend left hip hemiarthroplasty for pain control and immediate mobilization OOB. Plan for surgery today.   The risks, benefits, and alternatives were discussed with the patient. There are risks associated with the surgery including, but not limited to, problems with anesthesia (death), infection, instability (giving out of the joint), dislocation, differences in leg length/angulation/rotation, fracture of bones, loosening or failure of implants, hematoma  (blood accumulation) which may require surgical drainage, blood clots, pulmonary embolism, nerve injury (foot drop and lateral thigh numbness), and blood vessel injury. The patient understands these risks and elects to proceed.   Sheila JINNY Shoals, MD [phone]    06/04/2024 2:40 PM

## 2024-06-04 NOTE — Interval H&P Note (Signed)
 History and Physical Interval Note:  06/04/2024 6:54 AM  Sheila Jordan  has presented today for surgery, with the diagnosis of left femoral neck fracture.  The various methods of treatment have been discussed with the patient and family. After consideration of risks, benefits and other options for treatment, the patient has consented to  Procedure(s): HEMIARTHROPLASTY (BIPOLAR) HIP, POSTERIOR APPROACH FOR FRACTURE (Left) as a surgical intervention.  The patient's history has been reviewed, patient examined, no change in status, stable for surgery.  I have reviewed the patient's chart and labs.  Questions were answered to the patient's satisfaction.     Sheila Jordan

## 2024-06-05 ENCOUNTER — Other Ambulatory Visit: Payer: Self-pay

## 2024-06-05 DIAGNOSIS — S72002A Fracture of unspecified part of neck of left femur, initial encounter for closed fracture: Secondary | ICD-10-CM | POA: Diagnosis not present

## 2024-06-05 LAB — CBC
HCT: 33.3 % — ABNORMAL LOW (ref 36.0–46.0)
Hemoglobin: 11 g/dL — ABNORMAL LOW (ref 12.0–15.0)
MCH: 30.1 pg (ref 26.0–34.0)
MCHC: 33 g/dL (ref 30.0–36.0)
MCV: 91 fL (ref 80.0–100.0)
Platelets: 208 K/uL (ref 150–400)
RBC: 3.66 MIL/uL — ABNORMAL LOW (ref 3.87–5.11)
RDW: 15.3 % (ref 11.5–15.5)
WBC: 13.3 K/uL — ABNORMAL HIGH (ref 4.0–10.5)
nRBC: 0 % (ref 0.0–0.2)

## 2024-06-05 LAB — BASIC METABOLIC PANEL WITH GFR
Anion gap: 12 (ref 5–15)
BUN: 31 mg/dL — ABNORMAL HIGH (ref 8–23)
CO2: 19 mmol/L — ABNORMAL LOW (ref 22–32)
Calcium: 8.2 mg/dL — ABNORMAL LOW (ref 8.9–10.3)
Chloride: 108 mmol/L (ref 98–111)
Creatinine, Ser: 1.42 mg/dL — ABNORMAL HIGH (ref 0.44–1.00)
GFR, Estimated: 34 mL/min — ABNORMAL LOW (ref >60)
Glucose, Bld: 155 mg/dL — ABNORMAL HIGH (ref 70–99)
Potassium: 4 mmol/L (ref 3.5–5.1)
Sodium: 139 mmol/L (ref 135–145)

## 2024-06-05 MED ORDER — OXYCODONE HCL 5 MG PO TABS: 2.5000 mg | ORAL_TABLET | ORAL | 0 refills | Status: AC | PRN

## 2024-06-05 NOTE — Anesthesia Postprocedure Evaluation (Signed)
 Anesthesia Post Note  Patient: Sheila Jordan  Procedure(s) Performed: HEMIARTHROPLASTY (BIPOLAR) HIP, ANTERIOR  APPROACH FOR FRACTURE (Left)     Patient location during evaluation: PACU Anesthesia Type: General Level of consciousness: awake and alert Pain management: pain level controlled Vital Signs Assessment: post-procedure vital signs reviewed and stable Respiratory status: spontaneous breathing, nonlabored ventilation, respiratory function stable and patient connected to nasal cannula oxygen Cardiovascular status: blood pressure returned to baseline and stable Postop Assessment: no apparent nausea or vomiting Anesthetic complications: no   No notable events documented.  Last Vitals:  Vitals:   06/04/24 1936 06/05/24 0355  BP: (!) 127/52 (!) 114/55  Pulse: 60 63  Resp: 15 14  Temp: 36.5 C 36.6 C  SpO2: 92% 97%    Last Pain:  Vitals:   06/05/24 0355  TempSrc: Oral  PainSc:    Pain Goal: Patients Stated Pain Goal: 0 (06/04/24 2030)                 Garnette DELENA Gab

## 2024-06-05 NOTE — Evaluation (Signed)
 Physical Therapy Evaluation  Patient Details Name: Sheila Jordan MRN: [id number] DOB: 03-06-1928 Today's Date: 06/05/2024  History of Present Illness  Pt is a 89 y/o female who presents 06/02/2024 s/p mechanical fall in the yard at home. She sustained a displaced L femoral neck fracture and is now s/p L hemiarthroplasty (direct anterior approach) on 06/04/2024. PMH significant for a-fib, chronic dHF, CKD IIIb, CVA, HTN, R RSA 2021.   Clinical Impression  Pt admitted with above diagnosis. Pt currently with functional limitations due to the deficits listed below (see PT Problem List). At the time of PT eval pt was able to perform transfers and ambulation with gross min assist. +2 assist helpful for management of lines and chair follow on this first time up. Pt sat on the [facility] but was unable to void. RN notified. Pt complaining of more pain in the L knee than the L hip - ice pack applied at end of session. Anticipate pt will progress well with continued skilled physical therapy. Recommend an occupational therapy evaluation and participation with mobility specialists in conjunction with PT to maximize functional progress and facilitate return home at d/c. Son, Ron present in room and supportive throughout. He states that family will be able to provide 24/7 assist as needed. Pt will benefit from acute skilled PT to increase their independence and safety with mobility to allow discharge.           If plan is discharge home, recommend the following: A little help with walking and/or transfers;A little help with bathing/dressing/bathroom;Assistance with cooking/housework;Help with stairs or ramp for entrance;Assist for transportation   Can travel by private vehicle        Equipment Recommendations None recommended by PT  Recommendations for Other Services       Functional Status Assessment Patient has had a recent decline in their functional status and demonstrates the ability to make significant  improvements in function in a reasonable and predictable amount of time.     Precautions / Restrictions Precautions Precautions: Fall Recall of Precautions/Restrictions: Intact Restrictions Weight Bearing Restrictions Per Provider Order: Yes LLE Weight Bearing Per Provider Order: Weight bearing as tolerated      Mobility  Bed Mobility Overal bed mobility: Needs Assistance Bed Mobility: Supine to Sit     Supine to sit: Min assist     General bed mobility comments: Light assist for LLE advancement towards EOB. Pt was motivated to complete without assistance.    Transfers Overall transfer level: Needs assistance Equipment used: Rolling walker (2 wheels) Transfers: Bed to chair/wheelchair/BSC, Sit to/from Stand Sit to Stand: Min assist   Step pivot transfers: Min assist       General transfer comment: Light assist for walker management and balance. Pt was able to power up to full stand with increased time. Multimodal cues for hand placement on seated surface for safety prior to initiating stand>sit.    Ambulation/Gait Ambulation/Gait assistance: Min assist, +2 safety/equipment Gait Distance (Feet): 25 Feet Assistive device: Rolling walker (2 wheels) Gait Pattern/deviations: Step-through pattern, Decreased stride length, Trunk flexed Gait velocity: Decreased Gait velocity interpretation: <1.31 ft/sec, indicative of household ambulator   General Gait Details: VC's for improved posture and increased heel strike. Occasional assist for walker management. Appears to be holding the LLE in internal rotation.  Stairs            Wheelchair Mobility     Tilt Bed    Modified Rankin (Stroke Patients Only)  Balance Overall balance assessment: Needs assistance Sitting-balance support: Feet supported, No upper extremity supported Sitting balance-Leahy Scale: Fair     Standing balance support: Bilateral upper extremity supported, During functional activity,  Reliant on assistive device for balance Standing balance-Leahy Scale: Poor                               Pertinent Vitals/Pain Pain Assessment Pain Assessment: Faces Faces Pain Scale: Hurts little more Pain Location: L knee>L hip Pain Descriptors / Indicators: Operative site guarding, Sore, Aching Pain Intervention(s): Limited activity within patient's tolerance, Monitored during session, Repositioned    Home Living Family/patient expects to be discharged to:: Private residence Living Arrangements: Alone Available Help at Discharge: Family;Available 24 hours/day Type of Home: House Home Access: Ramped entrance       Home Layout: Two level;Able to live on main level with bedroom/bathroom Home Equipment: Grab bars - tub/shower;Tub bench;Rolling Walker (2 wheels);Cane - single point;Wheelchair - manual;Grab bars - toilet      Prior Function Prior Level of Function : Independent/Modified Independent                     Extremity/Trunk Assessment   Upper Extremity Assessment Upper Extremity Assessment: Defer to OT evaluation;Overall [facility] for tasks assessed    Lower Extremity Assessment Lower Extremity Assessment: LLE deficits/detail LLE Deficits / Details: Pt reports expeected post-op pain in hip but states it is minimal. Pt reports L knee is bothering her more and states it is swollen. On exam knee is not significantly edematous and pt was able to perform AROM supine in bed and at EOB. Pain limiting ability to achieve full extension. Ice pack provided to knee at end of session. Pt declined ice pack for hip.    Cervical / Trunk Assessment Cervical / Trunk Assessment: Kyphotic  Communication   Communication Communication: Impaired Factors Affecting Communication: Hearing impaired    Cognition Arousal: Alert Behavior During Therapy: WFL for tasks assessed/performed   PT - Cognitive impairments: No apparent impairments                          Following commands: Intact       Cueing       General Comments      Exercises General Exercises - Lower Extremity Ankle Circles/Pumps: 10 reps Long Arc Quad: 5 reps, Left Heel Slides: 5 reps, Left   Assessment/Plan    PT Assessment Patient needs continued PT services  PT Problem List Decreased strength;Decreased range of motion;Decreased activity tolerance;Decreased balance;Decreased mobility;Decreased knowledge of use of DME;Decreased safety awareness;Decreased knowledge of precautions;Pain       PT Treatment Interventions DME instruction;Gait training;Stair training;Functional mobility training;Therapeutic activities;Therapeutic exercise;Balance training;Patient/family education    PT Goals (Current goals can be found in the Care Plan section)  Acute Rehab PT Goals Patient Stated Goal: Home at d/c PT Goal Formulation: With patient/family Time For Goal Achievement: 06/19/24 Potential to Achieve Goals: Good    Frequency Min 3X/week     Co-evaluation               AM-PAC PT 6 Clicks Mobility  Outcome Measure Help needed turning from your back to your side while in a flat bed without using bedrails?: A Little Help needed moving from lying on your back to sitting on the side of a flat bed without using bedrails?: A Little Help needed moving to  and from a bed to a chair (including a wheelchair)?: A Little Help needed standing up from a chair using your arms (e.g., wheelchair or bedside chair)?: A Little Help needed to walk in hospital room?: A Little Help needed climbing 3-5 steps with a railing? : A Lot 6 Click Score: 17    End of Session Equipment Utilized During Treatment: Gait belt Activity Tolerance: Patient tolerated treatment well Patient left: in bed;with call bell/phone within reach;with family/visitor present Nurse Communication: Mobility status PT Visit Diagnosis: Unsteadiness on feet (R26.81);Pain Pain - Right/Left: Left Pain - part of body:  Hip;Knee    Time: 8967-8885 PT Time Calculation (min) (ACUTE ONLY): 42 min   Charges:   PT Evaluation $PT Eval Moderate Complexity: 1 Mod PT Treatments $Gait Training: 8-22 mins $Therapeutic Activity: 8-22 mins PT General Charges $$ ACUTE PT VISIT: 1 Visit         Leita Sable, PT, DPT Acute Rehabilitation Services Secure Chat Preferred Office: [phone]   Leita JONETTA Sable 06/05/2024, 1:05 PM

## 2024-06-05 NOTE — Progress Notes (Signed)
    Subjective:  Patient reports pain as mild.  Denies N/V/CP/SOB/Abd pain. She denies any tingling or numbness in LE bilaterally.  Family at bedside.  She is doing well today.   Objective:   VITALS:   Vitals:   06/04/24 1936 06/05/24 0355 06/05/24 0824 06/05/24 1434  BP: (!) 127/52 (!) 114/55 (!) 146/55 (!) 107/41  Pulse: 60 63 63 (!) 59  Resp: 15 14 17 18   Temp: 97.7 F (36.5 C) 97.8 F (36.6 C) 97.9 F (36.6 C) 98.5 F (36.9 C)  TempSrc: Oral Oral Oral Oral  SpO2: 92% 97% 96% 97%    NAD ABD soft Neurovascular intact Sensation intact distally Intact pulses distally Dorsiflexion/Plantar flexion intact Incision: dressing C/D/I No cellulitis present Compartment soft   Lab Results  Component Value Date   WBC 13.3 (H) 06/05/2024   HGB 11.0 (L) 06/05/2024   HCT 33.3 (L) 06/05/2024   MCV 91.0 06/05/2024   PLT 208 06/05/2024   BMET    Component Value Date/Time   NA 139 06/05/2024 1131   K 4.0 06/05/2024 1131   CL 108 06/05/2024 1131   CO2 19 (L) 06/05/2024 1131   GLUCOSE 155 (H) 06/05/2024 1131   BUN 31 (H) 06/05/2024 1131   CREATININE 1.42 (H) 06/05/2024 1131   CALCIUM 8.2 (L) 06/05/2024 1131   GFRNONAA 34 (L) 06/05/2024 1131     Assessment/Plan: 1 Day Post-Op   Principal Problem:   Closed left hip fracture, initial encounter (HCC) Active Problems:   HTN (hypertension)   Chronic diastolic heart failure (HCC)   PAF (paroxysmal atrial fibrillation) (HCC)   History of stroke   CKD stage 3b, GFR 30-44 ml/min (HCC)   Malnutrition of moderate degree   WBAT with walker DVT ppx: Eliquis  2.5mg  , SCDs, TEDS PO pain control PT/OT: She ambulated 25 feet with PT today.  Dispo: - Patient under care of the medical team disposition per their recommendation. Pain medication printed in chart. Can resume normal Eliquis  dose at discharge. She is very hopeful for d/c home with family assist.   Valery GORMAN Potters 06/05/2024, 4:43 PM   [facility]  [place]  [place] [address], [address], [place], [place] [address] Phone: [phone] [email or web address] Facebook  [employer]

## 2024-06-05 NOTE — Progress Notes (Signed)
 Progress Note   Patient: Sheila Jordan FMW:[id number] DOB: Nov 04, 1927 DOA: 06/02/2024     3 DOS: the patient was seen and examined on 06/05/2024   Brief hospital course: The patient is a 89 yr old woman who tripped and fell in her yard while spraying some week killer. She had immediate pain in her left hip and could not get up. In th eEd she was found to have a left hip fracture. Orthopedic surgery was consulted. The plan is for the patient to go for ORIF later today.  She carries a past medical history significant for arthritis, chronic diastolic heart failulre, CKD IIIb, History of CVA, hypertension, PAF.  The patient underwent ORIF of left hip on 06/04/2024. She has tolerated the procedure well. She is sitting up at bedside today with her daughter nearby. No new complaints.   Assessment and Plan: Left hip fracture  - Based on the available data, Mrs. Brundidge presents an estimated 1.1% risk of perioperative MI or cardiac arrest; she appears optimized for surgery and no further preoperative cardiac evaluation is indicated   - Continue pain-control and supportive care, hold Eliquis  (last dose was AM of 8/27), keep NPO after midnight   - The patient underwent ORIF of left hip on 06/04/2024. She has tolerated the procedure well. She is sitting up at bedside today with her daughter nearby. No new complaints.    2. PAF  - Rate is controlled on metoprolol  and sotalol . Both have been held. - Stroke prophylaxis on eliquis .This has been held due to surgery. Ortho has recommended restarting it upon discharge.   3. Chronic diastolic CHF  - Appears compensated  - Hold Lasix  while NPO, monitor volume status    4. Hypertension  - Continue hydralazine      5. CKD 3B  - Appears to be at baseline  - Renally-dose medications     6. Hx of CVA  - Eliquis  is held due to surgery. Ortho recommends restarting it at discharge.     DVT prophylaxis: SCDs, last took Eliquis  the morning of 8/27 Code Status:  Full perioperatively  Level of Care: Level of care: Med-Surg Family Communication: Son at bedside  Disposition Plan:  Patient is from: home  Anticipated d/c is to: TBD Anticipated d/c date is: 06/06/24  Patient currently: pending orthopedic surgery consultation and likely operative hip repair  Consults called: Orthopedic surgery        Subjective: The patient is resting comfortably. No new complaints.   Physical Exam: Vitals:   06/04/24 1936 06/05/24 0355 06/05/24 0824 06/05/24 1434  BP: (!) 127/52 (!) 114/55 (!) 146/55 (!) 107/41  Pulse: 60 63 63 (!) 59  Resp: 15 14 17 18   Temp: 97.7 F (36.5 C) 97.8 F (36.6 C) 97.9 F (36.6 C) 98.5 F (36.9 C)  TempSrc: Oral Oral Oral Oral  SpO2: 92% 97% 96% 97%   Exam:  Constitutional:  The patient is awake, alert, and oriented x 3. No acute distress. Respiratory:  No increased work of breathing. No wheezes, rales, or rhonchi No tactile fremitus Cardiovascular:  Regular rate and rhythm No murmurs, ectopy, or gallups. No lateral PMI. No thrills. Abdomen:  Abdomen is soft, non-tender, non-distended No hernias, masses, or organomegaly Normoactive bowel sounds.  Musculoskeletal:  No cyanosis, clubbing, or edema Skin:  No rashes, lesions, ulcers palpation of skin: no induration or nodules Psychiatric:  Mental status Mood, affect appropriate Orientation to person, place, time  judgment and insight appear intact  Data Reviewed:  CBC BMP  Family Communication: Family is at bedside.  Disposition: Status is: Inpatient Remains inpatient appropriate because: Pending ORIF of the hip.  Planned Discharge Destination: Rehab    Time spent: 32 minutes  Author: Marit Goodwill, DO 06/05/2024 5:41 PM  For on call review [email or web address].

## 2024-06-06 DIAGNOSIS — E440 Moderate protein-calorie malnutrition: Secondary | ICD-10-CM | POA: Diagnosis not present

## 2024-06-06 DIAGNOSIS — N1832 Chronic kidney disease, stage 3b: Secondary | ICD-10-CM | POA: Diagnosis not present

## 2024-06-06 DIAGNOSIS — S72002A Fracture of unspecified part of neck of left femur, initial encounter for closed fracture: Secondary | ICD-10-CM | POA: Diagnosis not present

## 2024-06-06 DIAGNOSIS — I480 Paroxysmal atrial fibrillation: Secondary | ICD-10-CM | POA: Diagnosis not present

## 2024-06-06 LAB — CBC
HCT: 28.9 % — ABNORMAL LOW (ref 36.0–46.0)
Hemoglobin: 9.6 g/dL — ABNORMAL LOW (ref 12.0–15.0)
MCH: 30.1 pg (ref 26.0–34.0)
MCHC: 33.2 g/dL (ref 30.0–36.0)
MCV: 90.6 fL (ref 80.0–100.0)
Platelets: 160 K/uL (ref 150–400)
RBC: 3.19 MIL/uL — ABNORMAL LOW (ref 3.87–5.11)
RDW: 15.2 % (ref 11.5–15.5)
WBC: 8.2 K/uL (ref 4.0–10.5)
nRBC: 0 % (ref 0.0–0.2)

## 2024-06-06 LAB — BASIC METABOLIC PANEL WITH GFR
Anion gap: 7 (ref 5–15)
BUN: 32 mg/dL — ABNORMAL HIGH (ref 8–23)
CO2: 21 mmol/L — ABNORMAL LOW (ref 22–32)
Calcium: 7.9 mg/dL — ABNORMAL LOW (ref 8.9–10.3)
Chloride: 108 mmol/L (ref 98–111)
Creatinine, Ser: 1.21 mg/dL — ABNORMAL HIGH (ref 0.44–1.00)
GFR, Estimated: 42 mL/min — ABNORMAL LOW (ref >60)
Glucose, Bld: 114 mg/dL — ABNORMAL HIGH (ref 70–99)
Potassium: 3.8 mmol/L (ref 3.5–5.1)
Sodium: 136 mmol/L (ref 135–145)

## 2024-06-06 LAB — GLUCOSE, CAPILLARY: Glucose-Capillary: 113 mg/dL — ABNORMAL HIGH (ref 70–99)

## 2024-06-06 MED ORDER — SENNA 8.6 MG PO TABS
2.0000 | ORAL_TABLET | Freq: Two times a day (BID) | ORAL | Status: DC
  Administered 2024-06-06 – 2024-06-07 (×2): 17.2 mg via ORAL
  Filled 2024-06-06 (×2): qty 2

## 2024-06-06 MED ORDER — POLYETHYLENE GLYCOL 3350 17 G PO PACK
17.0000 g | PACK | Freq: Two times a day (BID) | ORAL | Status: AC
  Administered 2024-06-06 (×2): 17 g via ORAL
  Filled 2024-06-06 (×2): qty 1

## 2024-06-06 NOTE — Progress Notes (Signed)
 Physical Therapy Treatment  Patient Details Name: Sheila Jordan MRN: [id number] DOB: 10-08-1927 Today's Date: 06/06/2024   History of Present Illness Pt is a 89 y/o female who presents 06/02/2024 s/p mechanical fall in the yard at home. She sustained a displaced L femoral neck fracture and is now s/p L hemiarthroplasty (direct anterior approach) on 06/04/2024. PMH significant for a-fib, chronic dHF, CKD IIIb, CVA, HTN, R RSA 2021.    PT Comments  Pt progressing well towards physical therapy goals. Continues to complain of pain in the LLE knee>hip, however does not appear to be physically limiting her today. Pt motivated for distance during gait training, and she was able to achieve ~150' with RW for support. Reviewed HEP and handout provided. Will continue to follow and progress as able per POC.     If plan is discharge home, recommend the following: A little help with walking and/or transfers;A little help with bathing/dressing/bathroom;Assistance with cooking/housework;Help with stairs or ramp for entrance;Assist for transportation   Can travel by private vehicle        Equipment Recommendations  None recommended by PT    Recommendations for Other Services       Precautions / Restrictions Precautions Precautions: Fall Recall of Precautions/Restrictions: Intact Restrictions Weight Bearing Restrictions Per Provider Order: Yes LLE Weight Bearing Per Provider Order: Weight bearing as tolerated     Mobility  Bed Mobility               General bed mobility comments: Pt was received sitting up in the recliner    Transfers Overall transfer level: Needs assistance Equipment used: Rolling walker (2 wheels) Transfers: Sit to/from Stand Sit to Stand: Min assist           General transfer comment: Light assist and increased time to power up to full stand.    Ambulation/Gait Ambulation/Gait assistance: Min assist, +2 safety/equipment Gait Distance (Feet): 150  Feet Assistive device: Rolling walker (2 wheels) Gait Pattern/deviations: Step-through pattern, Decreased stride length, Trunk flexed Gait velocity: Decreased Gait velocity interpretation: 1.31 - 2.62 ft/sec, indicative of limited community ambulator   General Gait Details: VC's for improved posture and increased heel strike. Occasional assist for walker management. Appears to be holding the LLE in internal rotation.CHair follow utilized for safety.   Stairs             Wheelchair Mobility     Tilt Bed    Modified Rankin (Stroke Patients Only)       Balance Overall balance assessment: Needs assistance Sitting-balance support: Feet supported, No upper extremity supported Sitting balance-Leahy Scale: Fair     Standing balance support: Bilateral upper extremity supported, During functional activity, Reliant on assistive device for balance Standing balance-Leahy Scale: Poor                              Communication Communication Communication: Impaired Factors Affecting Communication: Hearing impaired  Cognition Arousal: Alert Behavior During Therapy: WFL for tasks assessed/performed   PT - Cognitive impairments: No apparent impairments                         Following commands: Intact      Cueing Cueing Techniques: Verbal cues, Gestural cues  Exercises General Exercises - Lower Extremity Ankle Circles/Pumps: 10 reps Quad Sets: 10 reps Long Arc Quad: 10 reps Heel Slides: 10 reps Hip ABduction/ADduction: 10 reps    General Comments  Pertinent Vitals/Pain Pain Assessment Pain Assessment: Faces Faces Pain Scale: Hurts a little bit Pain Location: L knee>L hip Pain Descriptors / Indicators: Operative site guarding, Sore, Aching Pain Intervention(s): Limited activity within patient's tolerance, Monitored during session, Repositioned    Home Living                          Prior Function            PT Goals  (current goals can now be found in the care plan section) Acute Rehab PT Goals Patient Stated Goal: Home at d/c PT Goal Formulation: With patient/family Time For Goal Achievement: 06/19/24 Potential to Achieve Goals: Good Progress towards PT goals: Progressing toward goals    Frequency    Min 3X/week      PT Plan      Co-evaluation              AM-PAC PT 6 Clicks Mobility   Outcome Measure  Help needed turning from your back to your side while in a flat bed without using bedrails?: A Little Help needed moving from lying on your back to sitting on the side of a flat bed without using bedrails?: A Little Help needed moving to and from a bed to a chair (including a wheelchair)?: A Little Help needed standing up from a chair using your arms (e.g., wheelchair or bedside chair)?: A Little Help needed to walk in hospital room?: A Little Help needed climbing 3-5 steps with a railing? : A Lot 6 Click Score: 17    End of Session Equipment Utilized During Treatment: Gait belt Activity Tolerance: Patient tolerated treatment well Patient left: in bed;with call bell/phone within reach;with family/visitor present Nurse Communication: Mobility status PT Visit Diagnosis: Unsteadiness on feet (R26.81);Pain Pain - Right/Left: Left Pain - part of body: Hip;Knee     Time: 8841-8776 PT Time Calculation (min) (ACUTE ONLY): 25 min  Charges:    $Gait Training: 8-22 mins $Therapeutic Exercise: 8-22 mins PT General Charges $$ ACUTE PT VISIT: 1 Visit                     Leita Sable, PT, DPT Acute Rehabilitation Services Secure Chat Preferred Office: [phone]    Leita JONETTA Sable 06/06/2024, 2:59 PM

## 2024-06-06 NOTE — Evaluation (Signed)
 Occupational Therapy Evaluation Patient Details Name: Sheila Jordan MRN: [id number] DOB: [birth year] Today's Date: 06/06/2024   History of Present Illness   Pt is a 89 y/o female who presents 06/02/2024 s/p mechanical fall in the yard at home. She sustained a displaced L femoral neck fracture and is now s/p L hemiarthroplasty (direct anterior approach) on 06/04/2024. PMH significant for a-fib, chronic dHF, CKD IIIb, CVA, HTN, R RSA 2021.     Clinical Impressions At baseline, pt is Independent with ADLs, IADLs, functional mobility without an AD, and drives. Pt now presents with decreased balance, decreased activity tolerance, decreased knowledge of AE/DME, and decreased safety and independence with functional tasks. Pt currently demonstrates ability to complete ADLs Independent to Min assist, bed mobility with Contact guard assist, and functional transfers/mobility with a RW with Contact guard to Min assist. Pt participated well in session, is motivated to return to PLOF, and has good family support. Pt will benefit from acute skilled OT services to address deficits and increase safety and independence with functional tasks. Post acute discharge, pt will benefit from [facility] OT paired with assistance of family.      If plan is discharge home, recommend the following:   A little help with walking and/or transfers;A little help with bathing/dressing/bathroom;Assistance with cooking/housework;Assist for transportation;Help with stairs or ramp for entrance     Functional Status Assessment   Patient has had a recent decline in their functional status and demonstrates the ability to make significant improvements in function in a reasonable and predictable amount of time.     Equipment Recommendations   Other (comment) (AE for LB dressing/bathing)     Recommendations for Other Services         Precautions/Restrictions   Precautions Precautions: Fall Recall of Precautions/Restrictions:  Intact Restrictions Weight Bearing Restrictions Per Provider Order: Yes LLE Weight Bearing Per Provider Order: Weight bearing as tolerated     Mobility Bed Mobility Overal bed mobility: Needs Assistance Bed Mobility: Supine to Sit     Supine to sit: Contact guard     General bed mobility comments: Assist to manage L LE    Transfers Overall transfer level: Needs assistance Equipment used: Rolling walker (2 wheels) Transfers: Sit to/from Stand, Bed to chair/wheelchair/BSC Sit to Stand: Contact guard assist, Min assist     Step pivot transfers: Contact guard assist, Min assist     General transfer comment: Light assist and increased time to power up to full stand.      Balance Overall balance assessment: Needs assistance Sitting-balance support: No upper extremity supported, Feet supported Sitting balance-Leahy Scale: Good Sitting balance - Comments: able to reach outside of base of support sitting EOB with no LOB   Standing balance support: Single extremity supported, Bilateral upper extremity supported, During functional activity, Reliant on assistive device for balance Standing balance-Leahy Scale: Poor Standing balance comment: requires UE support                           ADL either performed or assessed with clinical judgement   ADL Overall ADL's : Needs assistance/impaired Eating/Feeding: Independent;Sitting   Grooming: Contact guard assist;Standing   Upper Body Bathing: Set up;Supervision/ safety;Sitting   Lower Body Bathing: Minimal assistance;Sit to/from stand;Cueing for compensatory techniques   Upper Body Dressing : Set up;Sitting   Lower Body Dressing: Minimal assistance;Sit to/from stand   Toilet Transfer: Contact guard assist;Minimal assistance;Ambulation;Regular Toilet;Rolling walker (2 wheels)   Toileting- Clothing Manipulation and  Hygiene: Contact guard assist;Minimal assistance;Sit to/from stand       Functional mobility during  ADLs: Contact guard assist;Minimal assistance;Rolling walker (2 wheels) General ADL Comments: Intiiated education in energy conservation strategies and options for AE for LB ADLs. Pt will benefit in additional training in use of AE for increased safety with LB ADLs.     Vision Baseline Vision/History: 1 Wears glasses Ability to See in Adequate Light: 0 Adequate (with glasses) Patient Visual Report: No change from baseline Vision Assessment?: No apparent visual deficits Additional Comments: Vision WFL with glasses donned, not formally screened or evaluated     Perception         Praxis         Pertinent Vitals/Pain Pain Assessment Pain Assessment: Faces Faces Pain Scale: Hurts a little bit Pain Location: L LE Pain Descriptors / Indicators: Operative site guarding, Sore, Aching Pain Intervention(s): Limited activity within patient's tolerance, Monitored during session, Repositioned     Extremity/Trunk Assessment Upper Extremity Assessment Upper Extremity Assessment: Right hand dominant;Overall [facility] for tasks assessed   Lower Extremity Assessment Lower Extremity Assessment: Defer to PT evaluation   Cervical / Trunk Assessment Cervical / Trunk Assessment: Kyphotic   Communication Communication Communication: Impaired Factors Affecting Communication: Hearing impaired   Cognition Arousal: Alert Behavior During Therapy: WFL for tasks assessed/performed Cognition: No apparent impairments             OT - Cognition Comments: AAOx4 and pleasant throughout session. Cognition WFL for tasks assessed.                 Following commands: Intact       Cueing  General Comments   Cueing Techniques: Verbal cues  Multiple family members present and supportive throughout session.   Exercises     Shoulder Instructions      Home Living Family/patient expects to be discharged to:: Private residence Living Arrangements: Alone Available Help at Discharge:  Family;Available 24 hours/day Type of Home: House Home Access: Ramped entrance     Home Layout: Two level;Able to live on main level with bedroom/bathroom     Bathroom Shower/Tub: Chief Strategy Officer: Handicapped height Bathroom Accessibility: Yes How Accessible: Accessible via walker Home Equipment: Grab bars - tub/shower;Tub bench;Rolling Walker (2 wheels);Cane - single point;Wheelchair - manual;Grab bars - toilet          Prior Functioning/Environment Prior Level of Function : Independent/Modified Independent;Driving             Mobility Comments: Ind without an AD; 1 fall while doing yard work leading to this admission ADLs Comments: Ind with ADLs and IADLs; drives; is a retired Runner, broadcasting/film/video who, 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, 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    OT Problem List: Impaired balance (sitting and/or standing);Decreased knowledge of use of DME or AE   OT Treatment/Interventions: Self-care/ADL training;Energy conservation;DME and/or AE instruction;Therapeutic activities;Patient/family education;Balance training      OT Goals(Current goals can be found in the care plan section)   Acute Rehab OT Goals Patient Stated Goal: to return home and remain independent OT Goal Formulation: With patient Time For Goal Achievement: 06/20/24 Potential to Achieve Goals: Good ADL Goals Pt Will Perform Grooming: with modified independence;standing Pt Will Perform Lower Body Bathing: with modified independence;with adaptive equipment;sit to/from stand Pt Will Perform Lower Body Dressing: with modified independence;with adaptive equipment;sit to/from stand Pt Will Transfer to Toilet: with modified independence;ambulating;regular height toilet (with least restrictive AD) Pt Will Perform  Tub/Shower Transfer: Tub transfer;with modified independence;ambulating;tub bench (with least restrictive AD)   OT Frequency:  Min 2X/week    Co-evaluation               AM-PAC OT 6 Clicks Daily Activity     Outcome Measure Help from another person eating meals?: None Help from another person taking care of personal grooming?: A Little Help from another person toileting, which includes using toliet, bedpan, or urinal?: A Little Help from another person bathing (including washing, rinsing, drying)?: A Little Help from another person to put on and taking off regular upper body clothing?: A Little Help from another person to put on and taking off regular lower body clothing?: A Little 6 Click Score: 19   End of Session Equipment Utilized During Treatment: Rolling walker (2 wheels);Gait belt Nurse Communication: Mobility status;Other (comment) (Pt sitting EOB with call bell in reach and multiple family members present. OT POC.)  Activity Tolerance: Patient tolerated treatment well Patient left: in bed;with call bell/phone within reach;with family/visitor present  OT Visit Diagnosis: Unsteadiness on feet (R26.81);Other abnormalities of gait and mobility (R26.89)                Time: 8460-8442 OT Time Calculation (min): 18 min Charges:  OT General Charges $OT Visit: 1 Visit OT Evaluation $OT Eval Low Complexity: 1 Low  Margarie Rockey HERO., OTR/L, MA Acute Rehab [phone]   Margarie FORBES Horns 06/06/2024, 6:13 PM

## 2024-06-06 NOTE — Progress Notes (Signed)
 PROGRESS NOTE  Sheila Jordan FMW:[id number] DOB: June 24, 1928   PCP: Sheryle Carwin, MD  Patient is from: Home.  Lives alone.  DOA: 06/02/2024 LOS: 4  Chief complaints Chief Complaint  Patient presents with   Fall     Brief Narrative / Interim history: 89 year old F with PMH of HFpEF, PAF on Eliquis , CVA, CKD-3B and HTN presented with left hip pain after she tripped and fell while spraying weed killer and found to have displaced fracture of left femoral neck.  CT head and cervical spine without acute finding.  Left knee x-ray without acute finding.  Patient underwent left hip hemiarthroplasty by Dr. Fidel on 8/29.  No postop complications.  Therapy recommended home health.  Likely discharge home on 9/1.  Subjective: Seen and examined earlier this morning.  No major events overnight or this morning.  Pain fairly controlled.  Has not had a bowel movement in 4 days.  Denies problem with urination.  Denies chest pain, dyspnea, nausea or vomiting.  Denies abdominal pain.  Objective: Vitals:   06/05/24 1434 06/05/24 2025 06/06/24 0557 06/06/24 0855  BP: (!) 107/41 (!) 123/44 (!) 130/50 (!) 113/52  Pulse: (!) 59 62 64 68  Resp: 18 16 15 16   Temp: 98.5 F (36.9 C) 97.8 F (36.6 C) 98.3 F (36.8 C) 98.1 F (36.7 C)  TempSrc: Oral Oral Oral Oral  SpO2: 97% 98% 97% 98%    Examination:  GENERAL: No apparent distress.  Nontoxic. HEENT: MMM.  Vision and hearing grossly intact.  NECK: Supple.  No apparent JVD.  RESP:  No IWOB.  Fair aeration bilaterally. CVS:  RRR. Heart sounds normal.  ABD/GI/GU: BS+. Abd soft, NTND.  MSK/EXT:  Moves extremities but limited on the left due to pain.  Dressing over left thigh DCI. SKIN: Dressing over left thigh DCI. NEURO: AA.  Oriented appropriately.  No apparent focal neuro deficit. PSYCH: Calm. Normal affect.   Consultants:  Orthopedic surgery  Procedures: 8/29-left hip hemiarthroplasty, anterior approach by Dr. Fidel  Microbiology  summarized: None  Assessment and plan: Mechanical fall at home Closed and displaced left femoral neck fracture due to fall at home -S/p left hip hemiarthroplasty by Dr. Fidel -Ortho recommends reduced dose Eliquis  in house and increasing to home dose on discharge -Pain control -PT/OT-recommended home health.  Patient and family to arrange 24/7 supervision.   Paroxysmal A-fib: Rate controlled. -Continue Toprol -XL and sotalol . -Anticoagulation as above. -Optimize electrolytes   Chronic diastolic CHF: Stable.  TTE 2023 with LVEF of 50 to 55%, G2 DD, mod PR and severe LAE.  On p.o. Lasix  20 mg daily as needed at home.  Appears euvolemic on exam. - Continue holding Lasix  -Stop IV fluid   Essential hypertension: Soft diastolic BP. -Toprol -XL and sotalol  as above. -Continue holding diuretics   CKD 3B: Relatively stable -Continue monitoring - Continue holding diuretics  Hx of CVA: No residual deficits. - Eliquis  is held due to surgery. Ortho recommends restarting it at discharge.  Postoperative acute blood loss anemia: Reported EBL about 200 cc.  Also dilutional from IV fluid. Recent Labs    02/03/24 1409 05/04/24 1205 06/02/24 2058 06/03/24 0437 06/04/24 0444 06/05/24 1131 06/06/24 0341  HGB 12.0 12.4 13.2 12.4 12.5 11.0* 9.6*  - Stop IV fluid - Recheck in the morning   Constipation - Bowel regimen.  Moderate malnutrition There is no height or weight on file to calculate BMI. Nutrition Problem: Moderate Malnutrition Etiology: chronic illness (CHF, aging related decline) Signs/Symptoms: moderate muscle depletion,  moderate fat depletion Interventions: Ensure Enlive (each supplement provides 350kcal and 20 grams of protein), Liberalize Diet, Magic cup   DVT prophylaxis:  apixaban  (ELIQUIS ) tablet 2.5 mg Start: 06/05/24 0800 SCDs Start: 06/04/24 1827 Place TED hose Start: 06/04/24 1827 SCDs Start: 06/02/24 2305 apixaban  (ELIQUIS ) tablet 2.5 mg  Code Status: Full  code Family Communication: Updated patient's son over the phone Level of care: Med-Surg Status is: Inpatient Remains inpatient appropriate because: Left hip fracture   Final disposition: Home   35 minutes with more than 50% spent in reviewing records, counseling patient/family and coordinating care.   Sch Meds:  Scheduled Meds:  apixaban   2.5 mg Oral Q12H   hydrALAZINE   25 mg Oral BID   metoprolol  succinate  37.5 mg Oral Daily   pantoprazole   40 mg Oral Q1200   polyethylene glycol  17 g Oral BID   senna  2 tablet Oral BID   sotalol   40 mg Oral Q12H   Continuous Infusions: PRN Meds:.menthol -cetylpyridinium **OR** phenol, methocarbamol , metoCLOPramide  **OR** metoCLOPramide  (REGLAN ) injection, oxyCODONE , prochlorperazine   Antimicrobials: Anti-infectives (From admission, onward)    Start     Dose/Rate Route Frequency Ordered Stop   06/04/24 2100  ceFAZolin  (ANCEF ) IVPB 2g/100 mL premix        2 g 200 mL/hr over 30 Minutes Intravenous Every 6 hours 06/04/24 1826 06/05/24 0506        I have personally reviewed the following labs and images: CBC: Recent Labs  Lab 06/02/24 2058 06/03/24 0437 06/04/24 0444 06/05/24 1131 06/06/24 0341  WBC 10.0 12.6* 9.4 13.3* 8.2  NEUTROABS 8.7*  --   --   --   --   HGB 13.2 12.4 12.5 11.0* 9.6*  HCT 39.7 37.1 37.7 33.3* 28.9*  MCV 92.1 89.8 90.8 91.0 90.6  PLT 219 207 206 208 160   BMP &GFR Recent Labs  Lab 06/02/24 2058 06/03/24 0437 06/04/24 0444 06/05/24 1131 06/06/24 0341  NA 140 138 137 139 136  K 3.7 4.4 4.3 4.0 3.8  CL 106 107 104 108 108  CO2 22 22 21* 19* 21*  GLUCOSE 134* 133* 110* 155* 114*  BUN 16 14 19  31* 32*  CREATININE 1.16* 1.14* 1.33* 1.42* 1.21*  CALCIUM 8.8* 8.8* 8.6* 8.2* 7.9*   CrCl cannot be calculated (Unknown ideal weight.). Liver & Pancreas: No results for input(s): AST, ALT, ALKPHOS, BILITOT, PROT, ALBUMIN in the last 168 hours. No results for input(s): LIPASE, AMYLASE in the  last 168 hours. No results for input(s): AMMONIA in the last 168 hours. Diabetic: No results for input(s): HGBA1C in the last 72 hours. No results for input(s): GLUCAP in the last 168 hours. Cardiac Enzymes: No results for input(s): CKTOTAL, CKMB, CKMBINDEX, TROPONINI in the last 168 hours. No results for input(s): PROBNP in the last 8760 hours. Coagulation Profile: No results for input(s): INR, PROTIME in the last 168 hours. Thyroid  Function Tests: No results for input(s): TSH, T4TOTAL, FREET4, T3FREE, THYROIDAB in the last 72 hours. Lipid Profile: No results for input(s): CHOL, HDL, LDLCALC, TRIG, CHOLHDL, LDLDIRECT in the last 72 hours. Anemia Panel: No results for input(s): VITAMINB12, FOLATE, FERRITIN, TIBC, IRON, RETICCTPCT in the last 72 hours. Urine analysis:    Component Value Date/Time   BILIRUBINUR small (A) 08/21/2022 1207   KETONESUR trace (5) (A) 08/21/2022 1207   PROTEINUR =30 (A) 08/21/2022 1207   UROBILINOGEN 0.2 08/21/2022 1207   NITRITE Negative 08/21/2022 1207   LEUKOCYTESUR Negative 08/21/2022 1207   Sepsis Labs: Invalid  input(s): PROCALCITONIN, LACTICIDVEN  Microbiology: Recent Results (from the past 240 hours)  Surgical pcr screen     Status: None   Collection Time: 06/03/24  7:32 AM   Specimen: Nasal Mucosa; Nasal Swab  Result Value Ref Range Status   MRSA, PCR NEGATIVE NEGATIVE Final   Staphylococcus aureus NEGATIVE NEGATIVE Final    Comment: (NOTE) The Xpert SA Assay (FDA approved for NASAL specimens in patients 64 years of age and older), is one component of a comprehensive surveillance program. It is not intended to diagnose infection nor to guide or monitor treatment. Performed at [facility], [address], [place], [place] [address]     Radiology Studies: No results found.    Shelisha Gautier T. Mayda Shippee [facility]  If 7PM-7AM, please contact  night-coverage [email or web address] 06/06/2024, 3:56 PM

## 2024-06-06 NOTE — Plan of Care (Signed)
  Problem: Education: Goal: Knowledge of General Education information will improve Description: Including pain rating scale, medication(s)/side effects and non-pharmacologic comfort measures Outcome: Progressing   Problem: Clinical Measurements: Goal: Ability to maintain clinical measurements within normal limits will improve Outcome: Progressing   Problem: Activity: Goal: Risk for activity intolerance will decrease Outcome: Progressing   Problem: Nutrition: Goal: Adequate nutrition will be maintained Outcome: Progressing   Problem: Elimination: Goal: Will not experience complications related to bowel motility Outcome: Progressing Goal: Will not experience complications related to urinary retention Outcome: Progressing   Problem: Pain Managment: Goal: General experience of comfort will improve and/or be controlled Outcome: Progressing   Problem: Safety: Goal: Ability to remain free from injury will improve Outcome: Progressing   Problem: Skin Integrity: Goal: Risk for impaired skin integrity will decrease Outcome: Progressing

## 2024-06-07 ENCOUNTER — Other Ambulatory Visit (HOSPITAL_COMMUNITY): Payer: Self-pay

## 2024-06-07 ENCOUNTER — Other Ambulatory Visit: Payer: Self-pay

## 2024-06-07 DIAGNOSIS — S72002A Fracture of unspecified part of neck of left femur, initial encounter for closed fracture: Secondary | ICD-10-CM | POA: Diagnosis not present

## 2024-06-07 DIAGNOSIS — I480 Paroxysmal atrial fibrillation: Secondary | ICD-10-CM | POA: Diagnosis not present

## 2024-06-07 DIAGNOSIS — E440 Moderate protein-calorie malnutrition: Secondary | ICD-10-CM | POA: Diagnosis not present

## 2024-06-07 DIAGNOSIS — N1832 Chronic kidney disease, stage 3b: Secondary | ICD-10-CM | POA: Diagnosis not present

## 2024-06-07 LAB — CBC
HCT: 28.2 % — ABNORMAL LOW (ref 36.0–46.0)
Hemoglobin: 9.4 g/dL — ABNORMAL LOW (ref 12.0–15.0)
MCH: 30.7 pg (ref 26.0–34.0)
MCHC: 33.3 g/dL (ref 30.0–36.0)
MCV: 92.2 fL (ref 80.0–100.0)
Platelets: 163 K/uL (ref 150–400)
RBC: 3.06 MIL/uL — ABNORMAL LOW (ref 3.87–5.11)
RDW: 15.3 % (ref 11.5–15.5)
WBC: 7.2 K/uL (ref 4.0–10.5)
nRBC: 0 % (ref 0.0–0.2)

## 2024-06-07 MED ORDER — PNEUMOCOCCAL 20-VAL CONJ VACC 0.5 ML IM SUSY
0.5000 mL | PREFILLED_SYRINGE | INTRAMUSCULAR | Status: DC
  Filled 2024-06-07: qty 0.5

## 2024-06-07 MED ORDER — SENNOSIDES-DOCUSATE SODIUM 8.6-50 MG PO TABS: 1.0000 | ORAL_TABLET | Freq: Two times a day (BID) | ORAL | Status: AC | PRN

## 2024-06-07 MED ORDER — POLYETHYLENE GLYCOL 3350 17 GM/SCOOP PO POWD
17.0000 g | Freq: Two times a day (BID) | ORAL | 2 refills | Status: AC | PRN
  Filled 2024-06-07: qty 238, 7d supply, fill #0

## 2024-06-07 MED ORDER — POLYETHYLENE GLYCOL 3350 17 G PO PACK
17.0000 g | PACK | Freq: Two times a day (BID) | ORAL | Status: DC
  Administered 2024-06-07: 17 g via ORAL
  Filled 2024-06-07: qty 1

## 2024-06-07 MED ORDER — MAGNESIUM CITRATE PO SOLN
1.0000 | Freq: Once | ORAL | Status: AC
  Administered 2024-06-07: 1 via ORAL
  Filled 2024-06-07 (×2): qty 296

## 2024-06-07 NOTE — Progress Notes (Signed)
 Occupational Therapy Treatment Patient Details Name: Sheila Jordan MRN: [id number] DOB: 11/15/1927 Today's Date: 06/07/2024   History of present illness Pt is a 89 y/o female who presents 06/02/2024 s/p mechanical fall in the yard at home. She sustained a displaced L femoral neck fracture and is now s/p L hemiarthroplasty (direct anterior approach) on 06/04/2024. PMH significant for a-fib, chronic dHF, CKD IIIb, CVA, HTN, R RSA 2021.   OT comments  Pt making excellent progress towards OT goals and eager to discharge home today. Emphasis on AE for LB ADLs, fall prevention techniques during standing ADLs and in-room mobility using RW. Pt able to manage all tasks with Supervision-CGA. Daughter present, supportive; educated both and provided a handout on where to obtain AE as needed for home use. Continue to recommend HHOT follow up.       If plan is discharge home, recommend the following:  A little help with walking and/or transfers;A little help with bathing/dressing/bathroom;Assistance with cooking/housework;Assist for transportation;Help with stairs or ramp for entrance   Equipment Recommendations  Other (comment) (sock aid; pt/family aware of where to purchase)    Recommendations for Other Services      Precautions / Restrictions Precautions Precautions: Fall Recall of Precautions/Restrictions: Intact Restrictions Weight Bearing Restrictions Per Provider Order: Yes LLE Weight Bearing Per Provider Order: Weight bearing as tolerated       Mobility Bed Mobility Overal bed mobility: Needs Assistance Bed Mobility: Supine to Sit     Supine to sit: Supervision, HOB elevated          Transfers Overall transfer level: Needs assistance Equipment used: Rolling walker (2 wheels) Transfers: Sit to/from Stand Sit to Stand: Contact guard assist                 Balance Overall balance assessment: Needs assistance Sitting-balance support: No upper extremity supported, Feet  supported Sitting balance-Leahy Scale: Good     Standing balance support: Single extremity supported, Bilateral upper extremity supported, During functional activity, Reliant on assistive device for balance Standing balance-Leahy Scale: Fair                             ADL either performed or assessed with clinical judgement   ADL Overall ADL's : Needs assistance/impaired     Grooming: Supervision/safety;Contact guard assist;Standing;Oral care               Lower Body Dressing: Contact guard assist;Sitting/lateral leans;Sit to/from stand;With adaptive equipment Lower Body Dressing Details (indicate cue type and reason): Pt able to more easily reach feet today, able to doff sock unable to don. Educated on sock aid use with pt able to don sock using AE. Educated on reacher use for pants, underwear and daughter present also familiar with AE. Provided handout and education on where to obtain AE. Discussed alternating UE support to don pants over waist             Functional mobility during ADLs: Contact guard assist;Rolling walker (2 wheels)      Extremity/Trunk Assessment Upper Extremity Assessment Upper Extremity Assessment: Overall WFL for tasks assessed;Right hand dominant   Lower Extremity Assessment Lower Extremity Assessment: Defer to PT evaluation        Vision   Vision Assessment?: No apparent visual deficits   Perception     Praxis     Communication Communication Communication: Impaired Factors Affecting Communication: Hearing impaired   Cognition Arousal: Alert Behavior During Therapy: [facility] for  tasks assessed/performed Cognition: No apparent impairments                               Following commands: Intact        Cueing   Cueing Techniques: Verbal cues  Exercises      Shoulder Instructions       General Comments Daughter present and supportive    Pertinent Vitals/ Pain       Pain Assessment Pain Assessment:  Faces Faces Pain Scale: Hurts a little bit Pain Location: L LE Pain Descriptors / Indicators: Guarding Pain Intervention(s): Monitored during session  Home Living                                          Prior Functioning/Environment              Frequency  Min 2X/week        Progress Toward Goals  OT Goals(current goals can now be found in the care plan section)  Progress towards OT goals: Progressing toward goals  Acute Rehab OT Goals Patient Stated Goal: home today OT Goal Formulation: With patient Time For Goal Achievement: 06/20/24 Potential to Achieve Goals: Good ADL Goals Pt Will Perform Grooming: with modified independence;standing Pt Will Perform Lower Body Bathing: with modified independence;with adaptive equipment;sit to/from stand Pt Will Perform Lower Body Dressing: with modified independence;with adaptive equipment;sit to/from stand Pt Will Transfer to Toilet: with modified independence;ambulating;regular height toilet Pt Will Perform Tub/Shower Transfer: Tub transfer;with modified independence;ambulating;tub bench  Plan      Co-evaluation                 AM-PAC OT 6 Clicks Daily Activity     Outcome Measure   Help from another person eating meals?: None Help from another person taking care of personal grooming?: A Little Help from another person toileting, which includes using toliet, bedpan, or urinal?: A Little Help from another person bathing (including washing, rinsing, drying)?: A Little Help from another person to put on and taking off regular upper body clothing?: A Little Help from another person to put on and taking off regular lower body clothing?: A Little 6 Click Score: 19    End of Session Equipment Utilized During Treatment: Gait belt;Rolling walker (2 wheels)  OT Visit Diagnosis: Unsteadiness on feet (R26.81);Other abnormalities of gait and mobility (R26.89)   Activity Tolerance Patient tolerated  treatment well   Patient Left Other (comment);with family/visitor present (to ambulate with PT)   Nurse Communication Mobility status        Time: 9093-9074 OT Time Calculation (min): 19 min  Charges: OT General Charges $OT Visit: 1 Visit OT Treatments $Self Care/Home Management : 8-22 mins  Mliss NOVAK, OTR/L Acute Rehab Services Office: [phone]   Mliss Fish 06/07/2024, 9:42 AM

## 2024-06-07 NOTE — TOC Transition Note (Signed)
 Transition of Care [facility]) - Discharge Note   Patient Details  Name: Sheila Jordan MRN: [id number] Date of Birth: 03-09-1928  Transition of [facility]) CM/SW Contact:  Rosalva Jon Bloch, RN Phone Number: 06/07/2024, 10:26 AM   Clinical Narrative:    Patient will DC to: home Anticipated DC date: 06/07/2024 Family notified: yes Transport by:car        - s/p L hemiarthroplasty on 06/04/2024  Per MD patient ready for DC today. RN, patient, patient's daughter [name] aware of DC. Pt is from home alone. Daughter states family will ensure pt has the assistance and supervision needed post d/c. Pt agreeable to home health services. Pt without provider preference. Referral made with [facility] and accepted. Pt without DME needs. States has DME @ home. Pt without RX med concerns or transportation issues. Daughter to provide transportation to home. Post hospital f/u noted on AVS.  RNCM will sign off for now as intervention is no longer needed. Please consult us  again if new needs arise.    Final next level of care: Home w Home Health Services Barriers to Discharge: No Barriers Identified   Patient Goals and CMS Choice            Discharge Placement                       Discharge Plan and Services Additional resources added to the After Visit Summary for                            HH Arranged: PT, OT HH Agency: [facility] Date HH Agency Contacted: 06/07/24 Time HH Agency Contacted: 1026 Representative spoke with at [facility] Agency: [facility]  Social Drivers of Health (SDOH) Interventions SDOH Screenings   Food Insecurity: No Food Insecurity (06/04/2024)  Housing: Low Risk  (06/04/2024)  Transportation Needs: No Transportation Needs (06/04/2024)  Utilities: Not At Risk (06/04/2024)  Social Connections: Moderately Isolated (06/04/2024)  Tobacco Use: Low Risk  (06/04/2024)     Readmission Risk Interventions     No data to display

## 2024-06-07 NOTE — Discharge Summary (Signed)
 Physician Discharge Summary  Sheila Jordan FMW:[id number] DOB: 1928/01/05 DOA: 06/02/2024  PCP: Sheryle Carwin, MD  Admit date: 06/02/2024 Discharge date: 06/07/24  Admitted From: Home Disposition: Home Recommendations for Outpatient Follow-up:  Outpatient follow-up with PCP and orthopedic surgery as below Check CMP and CBC at follow-up Please follow up on the following pending results: None  Home Health: [facility] PT/OT Equipment/Devices: No need identified  Discharge Condition: Stable CODE STATUS: Full code Diet Orders (From admission, onward)     Start     Ordered   06/07/24 0000  Diet - low sodium heart healthy        06/07/24 0804   06/04/24 1827  Diet Heart Room service appropriate? Yes; Fluid consistency: Thin  Diet effective now       Question Answer Comment  Room service appropriate? Yes   Fluid consistency: Thin      06/04/24 1826             Follow-up Information     Leigh Valery RAMAN, PA-C. Schedule an appointment as soon as possible for a visit in 2 week(s).   Specialty: Orthopedic Surgery Why: For suture removal, For wound re-check Contact information: [address] [place] [address] [phone]         Sheryle Carwin, MD. Schedule an appointment as soon as possible for a visit in 1 week(s).   Specialty: Internal Medicine Contact information: [address] [place] [address] [phone]         [facility] Follow up.   Why: home health services will be provided by [facility], start of care within 48 hours post discharge. Questionsplease call [phone]                Hospital course 89 year old F with PMH of HFpEF, PAF on Eliquis , CVA, CKD-3B and HTN presented with left hip pain after she tripped and fell while spraying weed killer and found to have displaced fracture of left femoral neck.  CT head and cervical spine without acute finding.  Left knee x-ray without acute finding.   Patient  underwent left hip hemiarthroplasty by Dr. Fidel on 8/29.  No major postop complications other than constipation that has resolved with bowel regimen.  Patient discharged with home health PT/OT as recommended by therapy.  She has 24/7 supervision.   See individual problem list below for more.   Problems addressed during this hospitalization Mechanical fall at home Closed and displaced left femoral neck fracture due to fall at home -S/p left hip hemiarthroplasty by Dr. Fidel -Already on Eliquis  which service VTE prophylaxis - Pain meds per Ortho. - HH PT/OT   Paroxysmal A-fib: Rate controlled. -Continue Toprol -XL, sotalol  and Eliquis    Chronic diastolic CHF: Stable.  TTE 2023 with LVEF of 50 to 55%, G2 DD, mod PR and severe LAE.  On p.o. Lasix  20 mg daily as needed at home.  Appears euvolemic on exam. -Continue home Lasix  on discharge   Essential hypertension: SBP in 140s. - Continue home Toprol -XL, sotalol  and Lasix    CKD 3B: Relatively stable -Recheck renal function at follow-up   Hx of CVA: No apparent residual deficits. - Eliquis  is held due to surgery. Ortho recommends restarting it at discharge.   Postoperative ABLA: Reported EBL about 200 cc.  Also dilutional from IV fluid.  Now stable. - Recheck CBC at follow-up   Constipation: passing gas. abdomen soft and nontender -Continue miralax  and senokot-s   Moderate malnutrition Body mass  index is 25.16 kg/m. Nutrition Problem: Moderate Malnutrition Etiology: chronic illness (CHF, aging related decline) Signs/Symptoms: moderate muscle depletion, moderate fat depletion Interventions: Ensure Enlive (each supplement provides 350kcal and 20 grams of protein), Liberalize Diet, Magic cup     Consultations: Orthopedic surgery  Time spent 35  minutes  Vital signs Vitals:   06/06/24 2000 06/06/24 2145 06/07/24 0351 06/07/24 0758  BP:  (!) 139/55 (!) 144/63 (!) 140/77  Pulse:  73 67 65  Temp:   97.9 F (36.6 C)  97.8 F (36.6 C)  Resp: 16  15 19   Height:  5' 4 (1.626 m)    Weight:  66.5 kg    SpO2:   95% 97%  TempSrc: Oral  Oral Oral  BMI (Calculated):  25.15       Discharge exam  GENERAL: No apparent distress.  Nontoxic. HEENT: MMM.  Vision and hearing grossly intact.  NECK: Supple.  No apparent JVD.  RESP:  No IWOB.  Fair aeration bilaterally. CVS:  RRR. Heart sounds normal.  ABD/GI/GU: BS+. Abd soft, NTND.  MSK/EXT:  Moves extremities but limited on the left due to pain.  Dressing over left thigh DCI. SKIN: Dressing over left thigh DCI. NEURO: AA.  Oriented appropriately.  No apparent focal neuro deficit. PSYCH: Calm. Normal affect.   Discharge Instructions Discharge Instructions     Diet - low sodium heart healthy   Complete by: As directed    Discharge instructions   Complete by: As directed    It has been a pleasure taking care of you!  You were hospitalized due to hip fracture for which you have been treated surgically.  Follow-up with your orthopedic surgeon per their recommendation.  Take your medications as prescribed.   Take care,   Increase activity slowly   Complete by: As directed    No wound care   Complete by: As directed       Allergies as of 06/07/2024   No Known Allergies      Medication List     STOP taking these medications    Potassium Chloride  ER 20 MEQ Tbcr       TAKE these medications    acetaminophen  500 MG tablet Commonly known as: TYLENOL  Take 1,000 mg by mouth 2 (two) times daily as needed for moderate pain (pain score 4-6), fever or headache.   Eliquis  5 MG Tabs tablet Generic drug: apixaban  Take 1 tablet (5 mg total) by mouth 2 (two) times daily.   furosemide  20 MG tablet Commonly known as: Lasix  Take 1 tablet (20 mg total) by mouth daily. May take an extra 20 mg for weight gain of 2 lbs overnight or 5 lbs in a week.   hydrALAZINE  25 MG tablet Commonly known as: APRESOLINE  Take 1 tablet (25 mg total) by mouth in the  morning and at bedtime.   metoCLOPramide  10 MG tablet Commonly known as: REGLAN  Take 1 tablet (10 mg total) by mouth daily as needed for up to 6 doses for nausea.   metoprolol  succinate 25 MG 24 hr tablet Commonly known as: Toprol  XL Take 1.5 tablets (37.5 mg total) by mouth daily. May take an extra 1/2 tablet for palpitations   oxyCODONE  5 MG immediate release tablet Commonly known as: Roxicodone  Take 0.5-1 tablets (2.5-5 mg total) by mouth every 4 (four) hours as needed for severe pain (pain score 7-10).   pantoprazole  40 MG tablet Commonly known as: PROTONIX  Take 1 tablet (40 mg total) by mouth daily at  12 noon.   polyethylene glycol powder 17 GM/SCOOP powder Commonly known as: MiraLax  Take 17 g by mouth 2 (two) times daily as needed for moderate constipation or mild constipation.   senna-docusate 8.6-50 MG tablet Commonly known as: Senokot-S Take 1-2 tablets by mouth 2 (two) times daily between meals as needed for mild constipation or moderate constipation.   sotalol  80 MG tablet Commonly known as: BETAPACE  Take 0.5 tablets (40 mg total) by mouth 2 (two) times daily.         Procedures/Studies: 8/29-left hip hemiarthroplasty, anterior approach by Dr. Fidel    DG HIP UNILAT W OR W/O PELVIS 2-3 VIEWS LEFT Result Date: 06/04/2024 CLINICAL DATA:  Closed displaced fracture of left femoral neck. EXAM: DG HIP (WITH OR WITHOUT PELVIS) 2-3V LEFT COMPARISON:  June 02, 2024. FINDINGS: Status post left hip hemiarthroplasty for treatment of left femoral neck fracture. Expected postoperative changes seen in the surrounding soft tissues. IMPRESSION: Status post left hip hemiarthroplasty. Electronically Signed   By: Lynwood Landy Raddle M.D.   On: 06/04/2024 18:20   DG HIP UNILAT WITH PELVIS 1V LEFT Result Date: 06/04/2024 CLINICAL DATA:  Left hip hemiarthroplasty. EXAM: DG HIP (WITH OR WITHOUT PELVIS) 1V*L* COMPARISON:  None Available. FINDINGS: Intraoperative imaging with a C-arm  demonstrates placement of a bipolar left hip hemiarthroplasty demonstrating normal alignment. IMPRESSION: Left hip hemiarthroplasty placement. Electronically Signed   By: Marcey Moan M.D.   On: 06/04/2024 17:09   DG C-Arm 1-60 Min-No Report Result Date: 06/04/2024 Fluoroscopy was utilized by the requesting physician.  No radiographic interpretation.   DG C-Arm 1-60 Min-No Report Result Date: 06/04/2024 Fluoroscopy was utilized by the requesting physician.  No radiographic interpretation.   DG Knee Left Port Result Date: 06/03/2024 CLINICAL DATA:  Femoral neck fracture. EXAM: PORTABLE LEFT KNEE - 1-2 VIEW COMPARISON:  None Available. FINDINGS: No acute fracture of the left knee. The bones are subjectively under mineralized. The alignment is normal. Moderate osteoarthritis with tricompartmental joint space narrowing and spurring. Trace joint effusion. Peripheral vascular calcifications are seen. IMPRESSION: 1. No acute fracture of the left knee. 2. Moderate osteoarthritis. Electronically Signed   By: Andrea Gasman M.D.   On: 06/03/2024 09:47   CT Head Wo Contrast Result Date: 06/02/2024 CLINICAL DATA:  Head trauma EXAM: CT HEAD WITHOUT CONTRAST CT CERVICAL SPINE WITHOUT CONTRAST TECHNIQUE: Multidetector CT imaging of the head and cervical spine was performed following the standard protocol without intravenous contrast. Multiplanar CT image reconstructions of the cervical spine were also generated. RADIATION DOSE REDUCTION: This exam was performed according to the departmental dose-optimization program which includes automated exposure control, adjustment of the mA and/or kV according to patient size and/or use of iterative reconstruction technique. COMPARISON:  CT brain 05/04/2024, 04/26/2024, MRI 08/26/2022, CT thoracic spine 09/12/2022 FINDINGS: CT HEAD FINDINGS Brain: No acute territorial infarction, hemorrhage or intracranial mass. Mild atrophy. Mild to moderate chronic small vessel ischemic  changes in the white matter. The ventricles are nonenlarged Vascular: No hyperdense vessels. Vertebral and carotid vascular calcification Skull: Normal. Negative for fracture or focal lesion. Sinuses/Orbits: No acute finding. Other: Small left forehead scalp soft tissue swelling. CT CERVICAL SPINE FINDINGS Alignment: Facet alignment is within normal limits. 7 mm anterior displacement tip of dens with respect to the clivus, but chronic compared with brain MRI from 2023. trace anterolisthesis C6 on C7 and C7 on T1. Skull base and vertebrae: No fracture of the cervical vertebral bodies. Moderate compression fracture T1, age indeterminate, appears new compared with  2023 thoracic CT. Mild to moderate T3 compression fracture age indeterminate, also appears new compared with 2023 CT. Soft tissues and spinal canal: No prevertebral fluid or swelling. No visible canal hematoma. Disc levels: Advanced C1-C2 degenerative change. Masslike probable inflammatory pannus posterior to the dens, also chronic. No high-grade bony canal stenosis. Relatively patent disc spaces. Hypertrophic facet degenerative changes at multiple levels with bilateral foraminal narrowing. Upper chest: No acute finding Other: None IMPRESSION: 1. No CT evidence for acute intracranial abnormality. Atrophy and chronic small vessel ischemic changes of the white matter. 2. No acute osseous abnormality of the cervical spine. 3. Moderate compression fracture of T1 and mild to moderate compression fracture of T3, age indeterminate, but appear new compared with 2023 CT. Electronically Signed   By: Luke Bun M.D.   On: 06/02/2024 21:23   CT Cervical Spine Wo Contrast Result Date: 06/02/2024 CLINICAL DATA:  Head trauma EXAM: CT HEAD WITHOUT CONTRAST CT CERVICAL SPINE WITHOUT CONTRAST TECHNIQUE: Multidetector CT imaging of the head and cervical spine was performed following the standard protocol without intravenous contrast. Multiplanar CT image reconstructions  of the cervical spine were also generated. RADIATION DOSE REDUCTION: This exam was performed according to the departmental dose-optimization program which includes automated exposure control, adjustment of the mA and/or kV according to patient size and/or use of iterative reconstruction technique. COMPARISON:  CT brain 05/04/2024, 04/26/2024, MRI 08/26/2022, CT thoracic spine 09/12/2022 FINDINGS: CT HEAD FINDINGS Brain: No acute territorial infarction, hemorrhage or intracranial mass. Mild atrophy. Mild to moderate chronic small vessel ischemic changes in the white matter. The ventricles are nonenlarged Vascular: No hyperdense vessels. Vertebral and carotid vascular calcification Skull: Normal. Negative for fracture or focal lesion. Sinuses/Orbits: No acute finding. Other: Small left forehead scalp soft tissue swelling. CT CERVICAL SPINE FINDINGS Alignment: Facet alignment is within normal limits. 7 mm anterior displacement tip of dens with respect to the clivus, but chronic compared with brain MRI from 2023. trace anterolisthesis C6 on C7 and C7 on T1. Skull base and vertebrae: No fracture of the cervical vertebral bodies. Moderate compression fracture T1, age indeterminate, appears new compared with 2023 thoracic CT. Mild to moderate T3 compression fracture age indeterminate, also appears new compared with 2023 CT. Soft tissues and spinal canal: No prevertebral fluid or swelling. No visible canal hematoma. Disc levels: Advanced C1-C2 degenerative change. Masslike probable inflammatory pannus posterior to the dens, also chronic. No high-grade bony canal stenosis. Relatively patent disc spaces. Hypertrophic facet degenerative changes at multiple levels with bilateral foraminal narrowing. Upper chest: No acute finding Other: None IMPRESSION: 1. No CT evidence for acute intracranial abnormality. Atrophy and chronic small vessel ischemic changes of the white matter. 2. No acute osseous abnormality of the cervical  spine. 3. Moderate compression fracture of T1 and mild to moderate compression fracture of T3, age indeterminate, but appear new compared with 2023 CT. Electronically Signed   By: Luke Bun M.D.   On: 06/02/2024 21:23   DG Hip Unilat W or Wo Pelvis 2-3 Views Left Result Date: 06/02/2024 CLINICAL DATA:  Fall and left-sided rib pain. EXAM: DG HIP (WITH OR WITHOUT PELVIS) 2-3V LEFT COMPARISON:  None Available. FINDINGS: There is a displaced fracture of the left femoral neck with slight proximal migration and impaction of the femoral shaft of the femoral head. No dislocation. Old fractures of the right pubic bone. The soft tissues are unremarkable. IMPRESSION: Displaced fracture of the left femoral neck. Electronically Signed   By: Vanetta Shelia HERO.D.  On: 06/02/2024 20:27       The results of significant diagnostics from this hospitalization (including imaging, microbiology, ancillary and laboratory) are listed below for reference.     Microbiology: Recent Results (from the past 240 hours)  Surgical pcr screen     Status: None   Collection Time: 06/03/24  7:32 AM   Specimen: Nasal Mucosa; Nasal Swab  Result Value Ref Range Status   MRSA, PCR NEGATIVE NEGATIVE Final   Staphylococcus aureus NEGATIVE NEGATIVE Final    Comment: (NOTE) The Xpert SA Assay (FDA approved for NASAL specimens in patients 88 years of age and older), is one component of a comprehensive surveillance program. It is not intended to diagnose infection nor to guide or monitor treatment. Performed at [facility], [address], [place], [place] [address]      Labs:  CBC: Recent Labs  Lab 06/02/24 [phone] 06/03/24 0437 06/04/24 0444 06/05/24 1131 06/06/24 0341 06/07/24 0644  WBC 10.0 12.6* 9.4 13.3* 8.2 7.2  NEUTROABS 8.7*  --   --   --   --   --   HGB 13.2 12.4 12.5 11.0* 9.6* 9.4*  HCT 39.7 37.1 37.7 33.3* 28.9* 28.2*  MCV 92.1 89.8 90.8 91.0 90.6 92.2  PLT 219 207 206 208 160 163   BMP  &GFR Recent Labs  Lab 06/02/24 2058 06/03/24 0437 06/04/24 0444 06/05/24 1131 06/06/24 0341  NA 140 138 137 139 136  K 3.7 4.4 4.3 4.0 3.8  CL 106 107 104 108 108  CO2 22 22 21* 19* 21*  GLUCOSE 134* 133* 110* 155* 114*  BUN 16 14 19  31* 32*  CREATININE 1.16* 1.14* 1.33* 1.42* 1.21*  CALCIUM 8.8* 8.8* 8.6* 8.2* 7.9*   Estimated Creatinine Clearance: 26.7 mL/min (A) (by C-G formula based on SCr of 1.21 mg/dL (H)). Liver & Pancreas: No results for input(s): AST, ALT, ALKPHOS, BILITOT, PROT, ALBUMIN in the last 168 hours. No results for input(s): LIPASE, AMYLASE in the last 168 hours. No results for input(s): AMMONIA in the last 168 hours. Diabetic: No results for input(s): HGBA1C in the last 72 hours. Recent Labs  Lab 06/06/24 1630  GLUCAP 113*   Cardiac Enzymes: No results for input(s): CKTOTAL, CKMB, CKMBINDEX, TROPONINI in the last 168 hours. No results for input(s): PROBNP in the last 8760 hours. Coagulation Profile: No results for input(s): INR, PROTIME in the last 168 hours. Thyroid  Function Tests: No results for input(s): TSH, T4TOTAL, FREET4, T3FREE, THYROIDAB in the last 72 hours. Lipid Profile: No results for input(s): CHOL, HDL, LDLCALC, TRIG, CHOLHDL, LDLDIRECT in the last 72 hours. Anemia Panel: No results for input(s): VITAMINB12, FOLATE, FERRITIN, TIBC, IRON, RETICCTPCT in the last 72 hours. Urine analysis:    Component Value Date/Time   BILIRUBINUR small (A) 08/21/2022 1207   KETONESUR trace (5) (A) 08/21/2022 1207   PROTEINUR =30 (A) 08/21/2022 1207   UROBILINOGEN 0.2 08/21/2022 1207   NITRITE Negative 08/21/2022 1207   LEUKOCYTESUR Negative 08/21/2022 1207   Sepsis Labs: Invalid input(s): PROCALCITONIN, LACTICIDVEN   SIGNED:  Danaly Bari T Mistey Hoffert, MD  [facility] 06/07/2024, 2:33 PM

## 2024-06-07 NOTE — Progress Notes (Signed)
 Reviewed AVS, patient and daughter expressed understanding of medications, MD follow up reviewed. Removed both IVs, site clean, dry, and intact. Gave patient printed prescription for Oxy and instructed procedure for pick up.   Discharge delayed due to provider requiring BM before discharge.  Medication, Mag Citrate given to help process and patient understands importance of this request.   Dtr. Will provide transportation home once pt. Has had BM.

## 2024-06-07 NOTE — Progress Notes (Signed)
 Physical Therapy Treatment  Patient Details Name: Sheila Jordan MRN: [id number] DOB: 1928/06/19 Today's Date: 06/07/2024   History of Present Illness Pt is a 89 y/o female who presents 06/02/2024 s/p mechanical fall in the yard at home. She sustained a displaced L femoral neck fracture and is now s/p L hemiarthroplasty (direct anterior approach) on 06/04/2024. PMH significant for a-fib, chronic dHF, CKD IIIb, CVA, HTN, R RSA 2021.    PT Comments  Pt progressing towards physical therapy goals. Pt is motivated for independence, and attempting to complete all bed mobility without assistance (bed height elevated and rails lowered to simulate home environment). However, requires min assist for LLE management. Reviewed general safety/home management with pt and daughter. Pt anticipates d/c home today with family support. Will continue to follow.    If plan is discharge home, recommend the following: A little help with walking and/or transfers;A little help with bathing/dressing/bathroom;Assistance with cooking/housework;Help with stairs or ramp for entrance;Assist for transportation   Can travel by private vehicle        Equipment Recommendations  None recommended by PT    Recommendations for Other Services       Precautions / Restrictions Precautions Precautions: Fall Recall of Precautions/Restrictions: Intact Restrictions Weight Bearing Restrictions Per Provider Order: Yes LLE Weight Bearing Per Provider Order: Weight bearing as tolerated     Mobility  Bed Mobility Overal bed mobility: Needs Assistance Bed Mobility: Supine to Sit, Sit to Supine     Supine to sit: Min assist Sit to supine: Min assist   General bed mobility comments: Pt motivated to attempt on her own without assist. Height of bed elevated and rails lowered to simulate home environment. Pt was able to complete most of each transfer with light min assist for LLE management.    Transfers Overall transfer level:  Needs assistance Equipment used: Rolling walker (2 wheels) Transfers: Sit to/from Stand Sit to Stand: Supervision           General transfer comment: VC's for hand placement on seated surface for safety.    Ambulation/Gait Ambulation/Gait assistance: Supervision, Contact guard assist Gait Distance (Feet): 150 Feet Assistive device: Rolling walker (2 wheels) Gait Pattern/deviations: Step-through pattern, Decreased stride length, Trunk flexed Gait velocity: Decreased Gait velocity interpretation: 1.31 - 2.62 ft/sec, indicative of limited community ambulator   General Gait Details: VC's for improved posture and increased heel strike. Occasional assist for walker management. Appears to be holding the LLE in internal rotation.CHair follow utilized for safety.   Stairs             Wheelchair Mobility     Tilt Bed    Modified Rankin (Stroke Patients Only)       Balance Overall balance assessment: Needs assistance Sitting-balance support: No upper extremity supported, Feet supported Sitting balance-Leahy Scale: Good Sitting balance - Comments: able to reach outside of base of support sitting EOB with no LOB   Standing balance support: Single extremity supported, Bilateral upper extremity supported, During functional activity, Reliant on assistive device for balance Standing balance-Leahy Scale: Fair Standing balance comment: requires UE support                            Communication Communication Communication: Impaired Factors Affecting Communication: Hearing impaired  Cognition Arousal: Alert Behavior During Therapy: WFL for tasks assessed/performed   PT - Cognitive impairments: No apparent impairments  Following commands: Intact      Cueing Cueing Techniques: Verbal cues  Exercises      General Comments General comments (skin integrity, edema, etc.): Daughter present and supportive      Pertinent  Vitals/Pain Pain Assessment Pain Assessment: Faces Faces Pain Scale: Hurts a little bit Pain Location: L knee and hip Pain Descriptors / Indicators: Operative site guarding, Grimacing, Sore Pain Intervention(s): Limited activity within patient's tolerance, Monitored during session, Repositioned    Home Living                          Prior Function            PT Goals (current goals can now be found in the care plan section) Acute Rehab PT Goals Patient Stated Goal: Home today, have a BM PT Goal Formulation: With patient/family Time For Goal Achievement: 06/19/24 Potential to Achieve Goals: Good Progress towards PT goals: Progressing toward goals    Frequency    Min 3X/week      PT Plan      Co-evaluation              AM-PAC PT 6 Clicks Mobility   Outcome Measure  Help needed turning from your back to your side while in a flat bed without using bedrails?: A Little Help needed moving from lying on your back to sitting on the side of a flat bed without using bedrails?: A Little Help needed moving to and from a bed to a chair (including a wheelchair)?: A Little Help needed standing up from a chair using your arms (e.g., wheelchair or bedside chair)?: A Little Help needed to walk in hospital room?: A Little Help needed climbing 3-5 steps with a railing? : A Lot 6 Click Score: 17    End of Session Equipment Utilized During Treatment: Gait belt Activity Tolerance: Patient tolerated treatment well Patient left: in bed;with call bell/phone within reach;with family/visitor present Nurse Communication: Mobility status PT Visit Diagnosis: Unsteadiness on feet (R26.81);Pain Pain - Right/Left: Left Pain - part of body: Hip;Knee     Time: 9075-9044 PT Time Calculation (min) (ACUTE ONLY): 31 min  Charges:    $Gait Training: 23-37 mins PT General Charges $$ ACUTE PT VISIT: 1 Visit                     Leita Sable, PT, DPT Acute Rehabilitation  Services Secure Chat Preferred Office: [phone]    Leita JONETTA Sable 06/07/2024, 10:05 AM

## 2024-06-07 NOTE — Plan of Care (Signed)
 Problem: Education: Goal: Knowledge of General Education information will improve Description: Including pain rating scale, medication(s)/side effects and non-pharmacologic comfort measures 06/07/2024 0118 by Billee Marybelle Ragena SHAUNNA, RN Outcome: Progressing 06/07/2024 0117 by Billee Marybelle Ragena SHAUNNA, RN Outcome: Progressing   Problem: Health Behavior/Discharge Planning: Goal: Ability to manage health-related needs will improve 06/07/2024 0118 by Billee Marybelle Ragena SHAUNNA, RN Outcome: Progressing 06/07/2024 0117 by Billee Marybelle Ragena SHAUNNA, RN Outcome: Progressing   Problem: Clinical Measurements: Goal: Ability to maintain clinical measurements within normal limits will improve 06/07/2024 0118 by Billee Marybelle Ragena SHAUNNA, RN Outcome: Progressing 06/07/2024 0117 by Billee Marybelle Ragena SHAUNNA, RN Outcome: Progressing Goal: Will remain free from infection 06/07/2024 0118 by Billee Marybelle Ragena SHAUNNA, RN Outcome: Progressing 06/07/2024 0117 by Billee Marybelle Ragena SHAUNNA, RN Outcome: Progressing Goal: Diagnostic test results will improve 06/07/2024 0118 by Billee Marybelle Ragena SHAUNNA, RN Outcome: Progressing 06/07/2024 0117 by Billee Marybelle Ragena SHAUNNA, RN Outcome: Progressing Goal: Respiratory complications will improve 06/07/2024 0118 by Billee Marybelle Ragena SHAUNNA, RN Outcome: Progressing 06/07/2024 0117 by Billee Marybelle Ragena SHAUNNA, RN Outcome: Progressing Goal: Cardiovascular complication will be avoided 06/07/2024 0118 by Billee Marybelle Ragena SHAUNNA, RN Outcome: Progressing 06/07/2024 0117 by Billee Marybelle Ragena SHAUNNA, RN Outcome: Progressing   Problem: Activity: Goal: Risk for activity intolerance will decrease 06/07/2024 0118 by Billee Marybelle Ragena SHAUNNA, RN Outcome: Progressing 06/07/2024 0117 by Billee Marybelle Ragena SHAUNNA, RN Outcome: Progressing   Problem: Nutrition: Goal: Adequate nutrition will be maintained 06/07/2024 0118 by Billee Marybelle Ragena SHAUNNA, RN Outcome: Progressing 06/07/2024 0117  by Billee Marybelle Ragena SHAUNNA, RN Outcome: Progressing   Problem: Coping: Goal: Level of anxiety will decrease 06/07/2024 0118 by Billee Marybelle Ragena SHAUNNA, RN Outcome: Progressing 06/07/2024 0117 by Billee Marybelle Ragena SHAUNNA, RN Outcome: Progressing   Problem: Elimination: Goal: Will not experience complications related to bowel motility 06/07/2024 0118 by Billee Marybelle Ragena SHAUNNA, RN Outcome: Progressing 06/07/2024 0117 by Billee Marybelle Ragena SHAUNNA, RN Outcome: Progressing Goal: Will not experience complications related to urinary retention 06/07/2024 0118 by Billee Marybelle Ragena SHAUNNA, RN Outcome: Progressing 06/07/2024 0117 by Billee Marybelle Ragena SHAUNNA, RN Outcome: Progressing   Problem: Pain Managment: Goal: General experience of comfort will improve and/or be controlled 06/07/2024 0118 by Billee Marybelle Ragena SHAUNNA, RN Outcome: Progressing 06/07/2024 0117 by Billee Marybelle Ragena SHAUNNA, RN Outcome: Progressing   Problem: Safety: Goal: Ability to remain free from injury will improve 06/07/2024 0118 by Billee Marybelle Ragena SHAUNNA, RN Outcome: Progressing 06/07/2024 0117 by Billee Marybelle Ragena SHAUNNA, RN Outcome: Progressing   Problem: Skin Integrity: Goal: Risk for impaired skin integrity will decrease 06/07/2024 0118 by Billee Marybelle Ragena SHAUNNA, RN Outcome: Progressing 06/07/2024 0117 by Billee Marybelle Ragena SHAUNNA, RN Outcome: Progressing   Problem: Education: Goal: Knowledge of the prescribed therapeutic regimen will improve 06/07/2024 0118 by Billee Marybelle Ragena SHAUNNA, RN Outcome: Progressing 06/07/2024 0117 by Billee Marybelle Ragena SHAUNNA, RN Outcome: Progressing Goal: Understanding of discharge needs will improve 06/07/2024 0118 by Billee Marybelle Ragena SHAUNNA, RN Outcome: Progressing 06/07/2024 0117 by Billee Marybelle Ragena SHAUNNA, RN Outcome: Progressing Goal: Individualized Educational Video(s) 06/07/2024 0118 by Billee Marybelle Ragena SHAUNNA, RN Outcome: Progressing 06/07/2024 0117 by Billee Marybelle Ragena SHAUNNA, RN Outcome: Progressing   Problem: Activity: Goal: Ability to avoid complications of mobility impairment will improve 06/07/2024 0118 by Billee Marybelle Ragena SHAUNNA, RN Outcome: Progressing 06/07/2024 0117 by Billee Marybelle Ragena SHAUNNA, RN Outcome: Progressing Goal: Ability to tolerate increased activity will improve 06/07/2024 0118 by Billee,  Marybelle Ragena SQUIBB, RN Outcome: Progressing 06/07/2024 0117 by Billee Marybelle Ragena SQUIBB, RN Outcome: Progressing   Problem: Clinical Measurements: Goal: Postoperative complications will be avoided or minimized 06/07/2024 0118 by Billee Marybelle Ragena SQUIBB, RN Outcome: Progressing 06/07/2024 0117 by Billee Marybelle Ragena SQUIBB, RN Outcome: Progressing   Problem: Pain Management: Goal: Pain level will decrease with appropriate interventions 06/07/2024 0118 by Billee Marybelle Ragena SQUIBB, RN Outcome: Progressing 06/07/2024 0117 by Billee Marybelle Ragena SQUIBB, RN Outcome: Progressing   Problem: Skin Integrity: Goal: Will show signs of wound healing 06/07/2024 0118 by Billee Marybelle Ragena SQUIBB, RN Outcome: Progressing 06/07/2024 0117 by Billee Marybelle Ragena SQUIBB, RN Outcome: Progressing

## 2024-06-08 ENCOUNTER — Other Ambulatory Visit: Payer: Self-pay

## 2024-06-08 ENCOUNTER — Telehealth: Payer: Self-pay

## 2024-06-08 ENCOUNTER — Encounter (HOSPITAL_COMMUNITY): Payer: Self-pay | Admitting: Orthopedic Surgery

## 2024-06-08 DIAGNOSIS — I130 Hypertensive heart and chronic kidney disease with heart failure and stage 1 through stage 4 chronic kidney disease, or unspecified chronic kidney disease: Secondary | ICD-10-CM | POA: Diagnosis not present

## 2024-06-08 DIAGNOSIS — D62 Acute posthemorrhagic anemia: Secondary | ICD-10-CM | POA: Diagnosis not present

## 2024-06-08 DIAGNOSIS — Z9181 History of falling: Secondary | ICD-10-CM | POA: Diagnosis not present

## 2024-06-08 DIAGNOSIS — E440 Moderate protein-calorie malnutrition: Secondary | ICD-10-CM | POA: Diagnosis not present

## 2024-06-08 DIAGNOSIS — K5900 Constipation, unspecified: Secondary | ICD-10-CM | POA: Diagnosis not present

## 2024-06-08 DIAGNOSIS — I480 Paroxysmal atrial fibrillation: Secondary | ICD-10-CM | POA: Diagnosis not present

## 2024-06-08 DIAGNOSIS — I5032 Chronic diastolic (congestive) heart failure: Secondary | ICD-10-CM | POA: Diagnosis not present

## 2024-06-08 DIAGNOSIS — N1832 Chronic kidney disease, stage 3b: Secondary | ICD-10-CM | POA: Diagnosis not present

## 2024-06-08 DIAGNOSIS — S72002D Fracture of unspecified part of neck of left femur, subsequent encounter for closed fracture with routine healing: Secondary | ICD-10-CM | POA: Diagnosis not present

## 2024-06-08 DIAGNOSIS — Z8673 Personal history of transient ischemic attack (TIA), and cerebral infarction without residual deficits: Secondary | ICD-10-CM | POA: Diagnosis not present

## 2024-06-08 DIAGNOSIS — Z556 Problems related to health literacy: Secondary | ICD-10-CM | POA: Diagnosis not present

## 2024-06-08 DIAGNOSIS — M1990 Unspecified osteoarthritis, unspecified site: Secondary | ICD-10-CM | POA: Diagnosis not present

## 2024-06-08 DIAGNOSIS — S42291D Other displaced fracture of upper end of right humerus, subsequent encounter for fracture with routine healing: Secondary | ICD-10-CM | POA: Diagnosis not present

## 2024-06-08 DIAGNOSIS — Z7901 Long term (current) use of anticoagulants: Secondary | ICD-10-CM | POA: Diagnosis not present

## 2024-06-08 DIAGNOSIS — Z96652 Presence of left artificial knee joint: Secondary | ICD-10-CM | POA: Diagnosis not present

## 2024-06-08 NOTE — Transitions of Care (Post Inpatient/ED Visit) (Signed)
   06/08/2024  Name: Sheila Jordan MRN: [id number] DOB: 1928-08-04  Today's TOC FU Call Status: Today's TOC FU Call Status:: Successful TOC FU Call Completed TOC FU Call Complete Date: 06/08/24 Patient's Name and Date of Birth confirmed.  Transition Care Management Follow-up Telephone Call Date of Discharge: 06/07/24 Discharge Facility: [facility]) Type of Discharge: Inpatient Admission Primary Inpatient Discharge Diagnosis:: Closed left hip fracture How have you been since you were released from the hospital?: Better Any questions or concerns?: No  Items Reviewed: Did you receive and understand the discharge instructions provided?: Yes Medications obtained,verified, and reconciled?: No Medications Not Reviewed Reasons::  (son does not have medications to review) Any new allergies since your discharge?: No Dietary orders reviewed?: Yes Type of Diet Ordered:: Heart Healthy Do you have support at home?: Yes People in Home [RPT]: child(ren), adult, other relative(s) Name of Support/Comfort Primary Source: Ron and [name], Patient also has 24/7 help in the home  Medications Reviewed Today: son does not have medications with him to review Medications Reviewed Today   Medications were not reviewed in this encounter     Home Care and Equipment/Supplies: Were Home Health Services Ordered?: Yes Name of Home Health Agency:: [facility] Has Agency set up a time to come to your home?: Yes First Home Health Visit Date: 06/08/24 Any new equipment or medical supplies ordered?: No  Functional Questionnaire: Do you need assistance with bathing/showering or dressing?: Yes (Has caregiver assistance) Do you need assistance with meal preparation?: Yes (Has caregiver assistance) Do you need assistance with eating?: No Do you have difficulty maintaining continence: No Do you need assistance with getting out of bed/getting out of a chair/moving?: Yes (family and caregiver assistance) Do you have  difficulty managing or taking your medications?: Yes (son and daughter set up medications)  Follow up appointments reviewed: PCP Follow-up appointment confirmed?: Yes Date of PCP follow-up appointment?: 06/25/24 Follow-up Provider: Dr. Sheryle Specialist [facility] Follow-up appointment confirmed?: Yes Date of Specialist follow-up appointment?: 06/22/24 Follow-Up Specialty Provider:: Valery Potters Do you need transportation to your follow-up appointment?: No Do you understand care options if your condition(s) worsen?: Yes-patient verbalized understanding  SDOH Interventions Today    Flowsheet Row Most Recent Value  SDOH Interventions   Food Insecurity Interventions Intervention Not Indicated  Housing Interventions Intervention Not Indicated  Transportation Interventions Intervention Not Indicated  Utilities Interventions Intervention Not Indicated    Meghin Thivierge J. Terelle Dobler RN, MSN [facility]  [facility], [facility] RN Care Manager Direct Dial: [phone]  Fax: [phone] Website: [email or web address]

## 2024-06-08 NOTE — Patient Instructions (Signed)
 Visit Information  Thank you for taking time to visit with me today. Please don't hesitate to contact me if I can be of assistance to you   Weightbearing as tolerated.   Keep left hip dressing on until follow up with surgeon. Monitor for swelling, redness, pain, pus, and fever Keep wound clean and dry.   Notify physician immediately for any changes    Patient verbalizes understanding of instructions and care plan provided today and agrees to view in MyChart. Active MyChart status and patient understanding of how to access instructions and care plan via MyChart confirmed with patient.     The patient has been provided with contact information for the care management team and has been advised to call with any health related questions or concerns.   Please call the care guide team at [phone] if you need to cancel or reschedule your appointment.   Please call the Suicide and Crisis Lifeline: 988 if you are experiencing a Mental Health or Behavioral Health Crisis or need someone to talk to.  Kanin Lia J. Wai Litt RN, MSN [facility], [facility] RN Care Manager Direct Dial: [phone]  Fax: [phone] Website: [email or web address]

## 2024-06-09 ENCOUNTER — Other Ambulatory Visit: Payer: Self-pay | Admitting: Internal Medicine

## 2024-06-09 DIAGNOSIS — S42291D Other displaced fracture of upper end of right humerus, subsequent encounter for fracture with routine healing: Secondary | ICD-10-CM | POA: Diagnosis not present

## 2024-06-09 DIAGNOSIS — I480 Paroxysmal atrial fibrillation: Secondary | ICD-10-CM | POA: Diagnosis not present

## 2024-06-09 DIAGNOSIS — Z9181 History of falling: Secondary | ICD-10-CM | POA: Diagnosis not present

## 2024-06-09 DIAGNOSIS — Z96652 Presence of left artificial knee joint: Secondary | ICD-10-CM | POA: Diagnosis not present

## 2024-06-09 DIAGNOSIS — N1832 Chronic kidney disease, stage 3b: Secondary | ICD-10-CM | POA: Diagnosis not present

## 2024-06-09 DIAGNOSIS — M1990 Unspecified osteoarthritis, unspecified site: Secondary | ICD-10-CM | POA: Diagnosis not present

## 2024-06-09 DIAGNOSIS — Z7901 Long term (current) use of anticoagulants: Secondary | ICD-10-CM | POA: Diagnosis not present

## 2024-06-09 DIAGNOSIS — I130 Hypertensive heart and chronic kidney disease with heart failure and stage 1 through stage 4 chronic kidney disease, or unspecified chronic kidney disease: Secondary | ICD-10-CM | POA: Diagnosis not present

## 2024-06-09 DIAGNOSIS — Z556 Problems related to health literacy: Secondary | ICD-10-CM | POA: Diagnosis not present

## 2024-06-09 DIAGNOSIS — D62 Acute posthemorrhagic anemia: Secondary | ICD-10-CM | POA: Diagnosis not present

## 2024-06-09 DIAGNOSIS — Z8673 Personal history of transient ischemic attack (TIA), and cerebral infarction without residual deficits: Secondary | ICD-10-CM | POA: Diagnosis not present

## 2024-06-09 DIAGNOSIS — E440 Moderate protein-calorie malnutrition: Secondary | ICD-10-CM | POA: Diagnosis not present

## 2024-06-09 DIAGNOSIS — K5900 Constipation, unspecified: Secondary | ICD-10-CM | POA: Diagnosis not present

## 2024-06-09 DIAGNOSIS — I5032 Chronic diastolic (congestive) heart failure: Secondary | ICD-10-CM | POA: Diagnosis not present

## 2024-06-09 DIAGNOSIS — S72002D Fracture of unspecified part of neck of left femur, subsequent encounter for closed fracture with routine healing: Secondary | ICD-10-CM | POA: Diagnosis not present

## 2024-06-10 ENCOUNTER — Other Ambulatory Visit (HOSPITAL_COMMUNITY): Payer: Self-pay

## 2024-06-10 MED ORDER — SOTALOL HCL 80 MG PO TABS
40.0000 mg | ORAL_TABLET | Freq: Two times a day (BID) | ORAL | 3 refills | Status: AC
  Filled 2024-06-10: qty 90, 90d supply, fill #0
  Filled 2024-08-15 – 2024-08-18 (×3): qty 90, 90d supply, fill #1

## 2024-06-11 DIAGNOSIS — Z7901 Long term (current) use of anticoagulants: Secondary | ICD-10-CM | POA: Diagnosis not present

## 2024-06-11 DIAGNOSIS — S42291D Other displaced fracture of upper end of right humerus, subsequent encounter for fracture with routine healing: Secondary | ICD-10-CM | POA: Diagnosis not present

## 2024-06-11 DIAGNOSIS — Z8673 Personal history of transient ischemic attack (TIA), and cerebral infarction without residual deficits: Secondary | ICD-10-CM | POA: Diagnosis not present

## 2024-06-11 DIAGNOSIS — K5900 Constipation, unspecified: Secondary | ICD-10-CM | POA: Diagnosis not present

## 2024-06-11 DIAGNOSIS — E440 Moderate protein-calorie malnutrition: Secondary | ICD-10-CM | POA: Diagnosis not present

## 2024-06-11 DIAGNOSIS — Z556 Problems related to health literacy: Secondary | ICD-10-CM | POA: Diagnosis not present

## 2024-06-11 DIAGNOSIS — N1832 Chronic kidney disease, stage 3b: Secondary | ICD-10-CM | POA: Diagnosis not present

## 2024-06-11 DIAGNOSIS — Z96652 Presence of left artificial knee joint: Secondary | ICD-10-CM | POA: Diagnosis not present

## 2024-06-11 DIAGNOSIS — M1990 Unspecified osteoarthritis, unspecified site: Secondary | ICD-10-CM | POA: Diagnosis not present

## 2024-06-11 DIAGNOSIS — I130 Hypertensive heart and chronic kidney disease with heart failure and stage 1 through stage 4 chronic kidney disease, or unspecified chronic kidney disease: Secondary | ICD-10-CM | POA: Diagnosis not present

## 2024-06-11 DIAGNOSIS — D62 Acute posthemorrhagic anemia: Secondary | ICD-10-CM | POA: Diagnosis not present

## 2024-06-11 DIAGNOSIS — I480 Paroxysmal atrial fibrillation: Secondary | ICD-10-CM | POA: Diagnosis not present

## 2024-06-11 DIAGNOSIS — S72002D Fracture of unspecified part of neck of left femur, subsequent encounter for closed fracture with routine healing: Secondary | ICD-10-CM | POA: Diagnosis not present

## 2024-06-11 DIAGNOSIS — Z9181 History of falling: Secondary | ICD-10-CM | POA: Diagnosis not present

## 2024-06-11 DIAGNOSIS — I5032 Chronic diastolic (congestive) heart failure: Secondary | ICD-10-CM | POA: Diagnosis not present

## 2024-06-14 DIAGNOSIS — K5900 Constipation, unspecified: Secondary | ICD-10-CM | POA: Diagnosis not present

## 2024-06-14 DIAGNOSIS — Z9181 History of falling: Secondary | ICD-10-CM | POA: Diagnosis not present

## 2024-06-14 DIAGNOSIS — I5032 Chronic diastolic (congestive) heart failure: Secondary | ICD-10-CM | POA: Diagnosis not present

## 2024-06-14 DIAGNOSIS — D62 Acute posthemorrhagic anemia: Secondary | ICD-10-CM | POA: Diagnosis not present

## 2024-06-14 DIAGNOSIS — S72002D Fracture of unspecified part of neck of left femur, subsequent encounter for closed fracture with routine healing: Secondary | ICD-10-CM | POA: Diagnosis not present

## 2024-06-14 DIAGNOSIS — I130 Hypertensive heart and chronic kidney disease with heart failure and stage 1 through stage 4 chronic kidney disease, or unspecified chronic kidney disease: Secondary | ICD-10-CM | POA: Diagnosis not present

## 2024-06-14 DIAGNOSIS — Z7901 Long term (current) use of anticoagulants: Secondary | ICD-10-CM | POA: Diagnosis not present

## 2024-06-14 DIAGNOSIS — S42291D Other displaced fracture of upper end of right humerus, subsequent encounter for fracture with routine healing: Secondary | ICD-10-CM | POA: Diagnosis not present

## 2024-06-14 DIAGNOSIS — Z8673 Personal history of transient ischemic attack (TIA), and cerebral infarction without residual deficits: Secondary | ICD-10-CM | POA: Diagnosis not present

## 2024-06-14 DIAGNOSIS — Z96652 Presence of left artificial knee joint: Secondary | ICD-10-CM | POA: Diagnosis not present

## 2024-06-14 DIAGNOSIS — E440 Moderate protein-calorie malnutrition: Secondary | ICD-10-CM | POA: Diagnosis not present

## 2024-06-14 DIAGNOSIS — I480 Paroxysmal atrial fibrillation: Secondary | ICD-10-CM | POA: Diagnosis not present

## 2024-06-14 DIAGNOSIS — Z556 Problems related to health literacy: Secondary | ICD-10-CM | POA: Diagnosis not present

## 2024-06-14 DIAGNOSIS — N1832 Chronic kidney disease, stage 3b: Secondary | ICD-10-CM | POA: Diagnosis not present

## 2024-06-14 DIAGNOSIS — M1990 Unspecified osteoarthritis, unspecified site: Secondary | ICD-10-CM | POA: Diagnosis not present

## 2024-06-15 ENCOUNTER — Other Ambulatory Visit (HOSPITAL_COMMUNITY): Payer: Self-pay

## 2024-06-16 DIAGNOSIS — E440 Moderate protein-calorie malnutrition: Secondary | ICD-10-CM | POA: Diagnosis not present

## 2024-06-16 DIAGNOSIS — S42291D Other displaced fracture of upper end of right humerus, subsequent encounter for fracture with routine healing: Secondary | ICD-10-CM | POA: Diagnosis not present

## 2024-06-16 DIAGNOSIS — K5900 Constipation, unspecified: Secondary | ICD-10-CM | POA: Diagnosis not present

## 2024-06-16 DIAGNOSIS — Z8673 Personal history of transient ischemic attack (TIA), and cerebral infarction without residual deficits: Secondary | ICD-10-CM | POA: Diagnosis not present

## 2024-06-16 DIAGNOSIS — I480 Paroxysmal atrial fibrillation: Secondary | ICD-10-CM | POA: Diagnosis not present

## 2024-06-16 DIAGNOSIS — Z7901 Long term (current) use of anticoagulants: Secondary | ICD-10-CM | POA: Diagnosis not present

## 2024-06-16 DIAGNOSIS — Z556 Problems related to health literacy: Secondary | ICD-10-CM | POA: Diagnosis not present

## 2024-06-16 DIAGNOSIS — Z96652 Presence of left artificial knee joint: Secondary | ICD-10-CM | POA: Diagnosis not present

## 2024-06-16 DIAGNOSIS — N1832 Chronic kidney disease, stage 3b: Secondary | ICD-10-CM | POA: Diagnosis not present

## 2024-06-16 DIAGNOSIS — D62 Acute posthemorrhagic anemia: Secondary | ICD-10-CM | POA: Diagnosis not present

## 2024-06-16 DIAGNOSIS — I5032 Chronic diastolic (congestive) heart failure: Secondary | ICD-10-CM | POA: Diagnosis not present

## 2024-06-16 DIAGNOSIS — M1990 Unspecified osteoarthritis, unspecified site: Secondary | ICD-10-CM | POA: Diagnosis not present

## 2024-06-16 DIAGNOSIS — Z9181 History of falling: Secondary | ICD-10-CM | POA: Diagnosis not present

## 2024-06-16 DIAGNOSIS — S72002D Fracture of unspecified part of neck of left femur, subsequent encounter for closed fracture with routine healing: Secondary | ICD-10-CM | POA: Diagnosis not present

## 2024-06-16 DIAGNOSIS — I130 Hypertensive heart and chronic kidney disease with heart failure and stage 1 through stage 4 chronic kidney disease, or unspecified chronic kidney disease: Secondary | ICD-10-CM | POA: Diagnosis not present

## 2024-06-17 DIAGNOSIS — N1832 Chronic kidney disease, stage 3b: Secondary | ICD-10-CM | POA: Diagnosis not present

## 2024-06-17 DIAGNOSIS — S42291D Other displaced fracture of upper end of right humerus, subsequent encounter for fracture with routine healing: Secondary | ICD-10-CM | POA: Diagnosis not present

## 2024-06-17 DIAGNOSIS — Z8673 Personal history of transient ischemic attack (TIA), and cerebral infarction without residual deficits: Secondary | ICD-10-CM | POA: Diagnosis not present

## 2024-06-17 DIAGNOSIS — Z556 Problems related to health literacy: Secondary | ICD-10-CM | POA: Diagnosis not present

## 2024-06-17 DIAGNOSIS — S72002D Fracture of unspecified part of neck of left femur, subsequent encounter for closed fracture with routine healing: Secondary | ICD-10-CM | POA: Diagnosis not present

## 2024-06-17 DIAGNOSIS — M1990 Unspecified osteoarthritis, unspecified site: Secondary | ICD-10-CM | POA: Diagnosis not present

## 2024-06-17 DIAGNOSIS — I130 Hypertensive heart and chronic kidney disease with heart failure and stage 1 through stage 4 chronic kidney disease, or unspecified chronic kidney disease: Secondary | ICD-10-CM | POA: Diagnosis not present

## 2024-06-17 DIAGNOSIS — E440 Moderate protein-calorie malnutrition: Secondary | ICD-10-CM | POA: Diagnosis not present

## 2024-06-17 DIAGNOSIS — Z96652 Presence of left artificial knee joint: Secondary | ICD-10-CM | POA: Diagnosis not present

## 2024-06-17 DIAGNOSIS — K5900 Constipation, unspecified: Secondary | ICD-10-CM | POA: Diagnosis not present

## 2024-06-17 DIAGNOSIS — Z7901 Long term (current) use of anticoagulants: Secondary | ICD-10-CM | POA: Diagnosis not present

## 2024-06-17 DIAGNOSIS — I480 Paroxysmal atrial fibrillation: Secondary | ICD-10-CM | POA: Diagnosis not present

## 2024-06-17 DIAGNOSIS — D62 Acute posthemorrhagic anemia: Secondary | ICD-10-CM | POA: Diagnosis not present

## 2024-06-17 DIAGNOSIS — I5032 Chronic diastolic (congestive) heart failure: Secondary | ICD-10-CM | POA: Diagnosis not present

## 2024-06-17 DIAGNOSIS — Z9181 History of falling: Secondary | ICD-10-CM | POA: Diagnosis not present

## 2024-06-22 DIAGNOSIS — Z96642 Presence of left artificial hip joint: Secondary | ICD-10-CM | POA: Diagnosis not present

## 2024-06-23 DIAGNOSIS — I130 Hypertensive heart and chronic kidney disease with heart failure and stage 1 through stage 4 chronic kidney disease, or unspecified chronic kidney disease: Secondary | ICD-10-CM | POA: Diagnosis not present

## 2024-06-23 DIAGNOSIS — S42291D Other displaced fracture of upper end of right humerus, subsequent encounter for fracture with routine healing: Secondary | ICD-10-CM | POA: Diagnosis not present

## 2024-06-23 DIAGNOSIS — Z556 Problems related to health literacy: Secondary | ICD-10-CM | POA: Diagnosis not present

## 2024-06-23 DIAGNOSIS — N1832 Chronic kidney disease, stage 3b: Secondary | ICD-10-CM | POA: Diagnosis not present

## 2024-06-23 DIAGNOSIS — I480 Paroxysmal atrial fibrillation: Secondary | ICD-10-CM | POA: Diagnosis not present

## 2024-06-23 DIAGNOSIS — M1990 Unspecified osteoarthritis, unspecified site: Secondary | ICD-10-CM | POA: Diagnosis not present

## 2024-06-23 DIAGNOSIS — Z7901 Long term (current) use of anticoagulants: Secondary | ICD-10-CM | POA: Diagnosis not present

## 2024-06-23 DIAGNOSIS — K5900 Constipation, unspecified: Secondary | ICD-10-CM | POA: Diagnosis not present

## 2024-06-23 DIAGNOSIS — D62 Acute posthemorrhagic anemia: Secondary | ICD-10-CM | POA: Diagnosis not present

## 2024-06-23 DIAGNOSIS — Z8673 Personal history of transient ischemic attack (TIA), and cerebral infarction without residual deficits: Secondary | ICD-10-CM | POA: Diagnosis not present

## 2024-06-23 DIAGNOSIS — Z96652 Presence of left artificial knee joint: Secondary | ICD-10-CM | POA: Diagnosis not present

## 2024-06-23 DIAGNOSIS — Z9181 History of falling: Secondary | ICD-10-CM | POA: Diagnosis not present

## 2024-06-23 DIAGNOSIS — E440 Moderate protein-calorie malnutrition: Secondary | ICD-10-CM | POA: Diagnosis not present

## 2024-06-23 DIAGNOSIS — I5032 Chronic diastolic (congestive) heart failure: Secondary | ICD-10-CM | POA: Diagnosis not present

## 2024-06-23 DIAGNOSIS — S72002D Fracture of unspecified part of neck of left femur, subsequent encounter for closed fracture with routine healing: Secondary | ICD-10-CM | POA: Diagnosis not present

## 2024-06-25 DIAGNOSIS — N183 Chronic kidney disease, stage 3 unspecified: Secondary | ICD-10-CM | POA: Diagnosis not present

## 2024-06-25 DIAGNOSIS — Z23 Encounter for immunization: Secondary | ICD-10-CM | POA: Diagnosis not present

## 2024-06-25 DIAGNOSIS — S72102A Unspecified trochanteric fracture of left femur, initial encounter for closed fracture: Secondary | ICD-10-CM | POA: Diagnosis not present

## 2024-06-28 ENCOUNTER — Other Ambulatory Visit (HOSPITAL_COMMUNITY): Payer: Self-pay

## 2024-06-30 DIAGNOSIS — Z556 Problems related to health literacy: Secondary | ICD-10-CM | POA: Diagnosis not present

## 2024-06-30 DIAGNOSIS — Z9181 History of falling: Secondary | ICD-10-CM | POA: Diagnosis not present

## 2024-06-30 DIAGNOSIS — Z96652 Presence of left artificial knee joint: Secondary | ICD-10-CM | POA: Diagnosis not present

## 2024-06-30 DIAGNOSIS — N1832 Chronic kidney disease, stage 3b: Secondary | ICD-10-CM | POA: Diagnosis not present

## 2024-06-30 DIAGNOSIS — I130 Hypertensive heart and chronic kidney disease with heart failure and stage 1 through stage 4 chronic kidney disease, or unspecified chronic kidney disease: Secondary | ICD-10-CM | POA: Diagnosis not present

## 2024-06-30 DIAGNOSIS — K5900 Constipation, unspecified: Secondary | ICD-10-CM | POA: Diagnosis not present

## 2024-06-30 DIAGNOSIS — Z7901 Long term (current) use of anticoagulants: Secondary | ICD-10-CM | POA: Diagnosis not present

## 2024-06-30 DIAGNOSIS — S42291D Other displaced fracture of upper end of right humerus, subsequent encounter for fracture with routine healing: Secondary | ICD-10-CM | POA: Diagnosis not present

## 2024-06-30 DIAGNOSIS — M1990 Unspecified osteoarthritis, unspecified site: Secondary | ICD-10-CM | POA: Diagnosis not present

## 2024-06-30 DIAGNOSIS — I5032 Chronic diastolic (congestive) heart failure: Secondary | ICD-10-CM | POA: Diagnosis not present

## 2024-06-30 DIAGNOSIS — D62 Acute posthemorrhagic anemia: Secondary | ICD-10-CM | POA: Diagnosis not present

## 2024-06-30 DIAGNOSIS — S72002D Fracture of unspecified part of neck of left femur, subsequent encounter for closed fracture with routine healing: Secondary | ICD-10-CM | POA: Diagnosis not present

## 2024-06-30 DIAGNOSIS — E440 Moderate protein-calorie malnutrition: Secondary | ICD-10-CM | POA: Diagnosis not present

## 2024-06-30 DIAGNOSIS — Z8673 Personal history of transient ischemic attack (TIA), and cerebral infarction without residual deficits: Secondary | ICD-10-CM | POA: Diagnosis not present

## 2024-06-30 DIAGNOSIS — I480 Paroxysmal atrial fibrillation: Secondary | ICD-10-CM | POA: Diagnosis not present

## 2024-07-08 DIAGNOSIS — M79674 Pain in right toe(s): Secondary | ICD-10-CM | POA: Diagnosis not present

## 2024-07-08 DIAGNOSIS — B351 Tinea unguium: Secondary | ICD-10-CM | POA: Diagnosis not present

## 2024-07-09 DIAGNOSIS — K5900 Constipation, unspecified: Secondary | ICD-10-CM | POA: Diagnosis not present

## 2024-07-09 DIAGNOSIS — I130 Hypertensive heart and chronic kidney disease with heart failure and stage 1 through stage 4 chronic kidney disease, or unspecified chronic kidney disease: Secondary | ICD-10-CM | POA: Diagnosis not present

## 2024-07-09 DIAGNOSIS — E440 Moderate protein-calorie malnutrition: Secondary | ICD-10-CM | POA: Diagnosis not present

## 2024-07-09 DIAGNOSIS — Z556 Problems related to health literacy: Secondary | ICD-10-CM | POA: Diagnosis not present

## 2024-07-09 DIAGNOSIS — Z7901 Long term (current) use of anticoagulants: Secondary | ICD-10-CM | POA: Diagnosis not present

## 2024-07-09 DIAGNOSIS — Z96652 Presence of left artificial knee joint: Secondary | ICD-10-CM | POA: Diagnosis not present

## 2024-07-09 DIAGNOSIS — N1832 Chronic kidney disease, stage 3b: Secondary | ICD-10-CM | POA: Diagnosis not present

## 2024-07-09 DIAGNOSIS — D62 Acute posthemorrhagic anemia: Secondary | ICD-10-CM | POA: Diagnosis not present

## 2024-07-09 DIAGNOSIS — S72002D Fracture of unspecified part of neck of left femur, subsequent encounter for closed fracture with routine healing: Secondary | ICD-10-CM | POA: Diagnosis not present

## 2024-07-09 DIAGNOSIS — M1990 Unspecified osteoarthritis, unspecified site: Secondary | ICD-10-CM | POA: Diagnosis not present

## 2024-07-09 DIAGNOSIS — I480 Paroxysmal atrial fibrillation: Secondary | ICD-10-CM | POA: Diagnosis not present

## 2024-07-09 DIAGNOSIS — Z9181 History of falling: Secondary | ICD-10-CM | POA: Diagnosis not present

## 2024-07-09 DIAGNOSIS — Z8673 Personal history of transient ischemic attack (TIA), and cerebral infarction without residual deficits: Secondary | ICD-10-CM | POA: Diagnosis not present

## 2024-07-09 DIAGNOSIS — S42291D Other displaced fracture of upper end of right humerus, subsequent encounter for fracture with routine healing: Secondary | ICD-10-CM | POA: Diagnosis not present

## 2024-07-09 DIAGNOSIS — I5032 Chronic diastolic (congestive) heart failure: Secondary | ICD-10-CM | POA: Diagnosis not present

## 2024-07-12 DIAGNOSIS — Z5181 Encounter for therapeutic drug level monitoring: Secondary | ICD-10-CM | POA: Diagnosis not present

## 2024-07-12 DIAGNOSIS — Z79899 Other long term (current) drug therapy: Secondary | ICD-10-CM | POA: Diagnosis not present

## 2024-07-13 DIAGNOSIS — I480 Paroxysmal atrial fibrillation: Secondary | ICD-10-CM | POA: Diagnosis not present

## 2024-07-13 DIAGNOSIS — Z556 Problems related to health literacy: Secondary | ICD-10-CM | POA: Diagnosis not present

## 2024-07-13 DIAGNOSIS — N1832 Chronic kidney disease, stage 3b: Secondary | ICD-10-CM | POA: Diagnosis not present

## 2024-07-13 DIAGNOSIS — E440 Moderate protein-calorie malnutrition: Secondary | ICD-10-CM | POA: Diagnosis not present

## 2024-07-13 DIAGNOSIS — I5032 Chronic diastolic (congestive) heart failure: Secondary | ICD-10-CM | POA: Diagnosis not present

## 2024-07-13 DIAGNOSIS — D62 Acute posthemorrhagic anemia: Secondary | ICD-10-CM | POA: Diagnosis not present

## 2024-07-13 DIAGNOSIS — Z9181 History of falling: Secondary | ICD-10-CM | POA: Diagnosis not present

## 2024-07-13 DIAGNOSIS — Z7901 Long term (current) use of anticoagulants: Secondary | ICD-10-CM | POA: Diagnosis not present

## 2024-07-13 DIAGNOSIS — S72002D Fracture of unspecified part of neck of left femur, subsequent encounter for closed fracture with routine healing: Secondary | ICD-10-CM | POA: Diagnosis not present

## 2024-07-13 DIAGNOSIS — Z8673 Personal history of transient ischemic attack (TIA), and cerebral infarction without residual deficits: Secondary | ICD-10-CM | POA: Diagnosis not present

## 2024-07-13 DIAGNOSIS — K5900 Constipation, unspecified: Secondary | ICD-10-CM | POA: Diagnosis not present

## 2024-07-13 DIAGNOSIS — M1990 Unspecified osteoarthritis, unspecified site: Secondary | ICD-10-CM | POA: Diagnosis not present

## 2024-07-13 DIAGNOSIS — Z96652 Presence of left artificial knee joint: Secondary | ICD-10-CM | POA: Diagnosis not present

## 2024-07-13 DIAGNOSIS — S42291D Other displaced fracture of upper end of right humerus, subsequent encounter for fracture with routine healing: Secondary | ICD-10-CM | POA: Diagnosis not present

## 2024-07-13 DIAGNOSIS — I130 Hypertensive heart and chronic kidney disease with heart failure and stage 1 through stage 4 chronic kidney disease, or unspecified chronic kidney disease: Secondary | ICD-10-CM | POA: Diagnosis not present

## 2024-07-13 LAB — BASIC METABOLIC PANEL WITH GFR
BUN/Creatinine Ratio: 10 — ABNORMAL LOW (ref 12–28)
BUN: 10 mg/dL (ref 10–36)
CO2: 22 mmol/L (ref 20–29)
Calcium: 8.9 mg/dL (ref 8.7–10.3)
Chloride: 106 mmol/L (ref 96–106)
Creatinine, Ser: 1.04 mg/dL — ABNORMAL HIGH (ref 0.57–1.00)
Glucose: 95 mg/dL (ref 70–99)
Potassium: 4 mmol/L (ref 3.5–5.2)
Sodium: 143 mmol/L (ref 134–144)
eGFR: 50 mL/min/1.73 — ABNORMAL LOW (ref >59)

## 2024-07-13 LAB — TSH: TSH: 3.14 u[IU]/mL (ref 0.450–4.500)

## 2024-07-14 ENCOUNTER — Other Ambulatory Visit (HOSPITAL_COMMUNITY): Payer: Self-pay

## 2024-07-19 ENCOUNTER — Encounter (HOSPITAL_COMMUNITY): Payer: Self-pay | Admitting: Internal Medicine

## 2024-07-19 DIAGNOSIS — Z79899 Other long term (current) drug therapy: Secondary | ICD-10-CM | POA: Diagnosis not present

## 2024-07-19 DIAGNOSIS — I480 Paroxysmal atrial fibrillation: Secondary | ICD-10-CM | POA: Diagnosis not present

## 2024-07-19 DIAGNOSIS — S72002A Fracture of unspecified part of neck of left femur, initial encounter for closed fracture: Secondary | ICD-10-CM | POA: Diagnosis not present

## 2024-07-19 DIAGNOSIS — I10 Essential (primary) hypertension: Secondary | ICD-10-CM | POA: Diagnosis not present

## 2024-07-19 DIAGNOSIS — D500 Iron deficiency anemia secondary to blood loss (chronic): Secondary | ICD-10-CM | POA: Diagnosis not present

## 2024-07-19 DIAGNOSIS — S72002S Fracture of unspecified part of neck of left femur, sequela: Secondary | ICD-10-CM

## 2024-07-19 DIAGNOSIS — N1832 Chronic kidney disease, stage 3b: Secondary | ICD-10-CM | POA: Diagnosis not present

## 2024-07-22 ENCOUNTER — Other Ambulatory Visit (HOSPITAL_COMMUNITY): Payer: Self-pay | Admitting: Internal Medicine

## 2024-07-22 DIAGNOSIS — N1832 Chronic kidney disease, stage 3b: Secondary | ICD-10-CM | POA: Diagnosis not present

## 2024-07-22 DIAGNOSIS — I130 Hypertensive heart and chronic kidney disease with heart failure and stage 1 through stage 4 chronic kidney disease, or unspecified chronic kidney disease: Secondary | ICD-10-CM | POA: Diagnosis not present

## 2024-07-22 DIAGNOSIS — Z9181 History of falling: Secondary | ICD-10-CM | POA: Diagnosis not present

## 2024-07-22 DIAGNOSIS — Z7901 Long term (current) use of anticoagulants: Secondary | ICD-10-CM | POA: Diagnosis not present

## 2024-07-22 DIAGNOSIS — D62 Acute posthemorrhagic anemia: Secondary | ICD-10-CM | POA: Diagnosis not present

## 2024-07-22 DIAGNOSIS — E440 Moderate protein-calorie malnutrition: Secondary | ICD-10-CM | POA: Diagnosis not present

## 2024-07-22 DIAGNOSIS — Z96652 Presence of left artificial knee joint: Secondary | ICD-10-CM | POA: Diagnosis not present

## 2024-07-22 DIAGNOSIS — K5900 Constipation, unspecified: Secondary | ICD-10-CM | POA: Diagnosis not present

## 2024-07-22 DIAGNOSIS — Z556 Problems related to health literacy: Secondary | ICD-10-CM | POA: Diagnosis not present

## 2024-07-22 DIAGNOSIS — I5032 Chronic diastolic (congestive) heart failure: Secondary | ICD-10-CM | POA: Diagnosis not present

## 2024-07-22 DIAGNOSIS — M1990 Unspecified osteoarthritis, unspecified site: Secondary | ICD-10-CM | POA: Diagnosis not present

## 2024-07-22 DIAGNOSIS — Z8489 Family history of other specified conditions: Secondary | ICD-10-CM

## 2024-07-22 DIAGNOSIS — S72002D Fracture of unspecified part of neck of left femur, subsequent encounter for closed fracture with routine healing: Secondary | ICD-10-CM | POA: Diagnosis not present

## 2024-07-22 DIAGNOSIS — Z8673 Personal history of transient ischemic attack (TIA), and cerebral infarction without residual deficits: Secondary | ICD-10-CM | POA: Diagnosis not present

## 2024-07-22 DIAGNOSIS — I480 Paroxysmal atrial fibrillation: Secondary | ICD-10-CM | POA: Diagnosis not present

## 2024-07-22 DIAGNOSIS — S42291D Other displaced fracture of upper end of right humerus, subsequent encounter for fracture with routine healing: Secondary | ICD-10-CM | POA: Diagnosis not present

## 2024-07-27 DIAGNOSIS — Z96642 Presence of left artificial hip joint: Secondary | ICD-10-CM | POA: Diagnosis not present

## 2024-07-27 DIAGNOSIS — Z471 Aftercare following joint replacement surgery: Secondary | ICD-10-CM | POA: Diagnosis not present

## 2024-07-28 ENCOUNTER — Other Ambulatory Visit: Payer: Self-pay

## 2024-08-03 ENCOUNTER — Ambulatory Visit (HOSPITAL_COMMUNITY)
Admission: RE | Admit: 2024-08-03 | Discharge: 2024-08-03 | Disposition: A | Discharge status: Home / Self Care | Source: Ambulatory Visit | Attending: Internal Medicine | Admitting: Internal Medicine

## 2024-08-03 DIAGNOSIS — M810 Age-related osteoporosis without current pathological fracture: Secondary | ICD-10-CM | POA: Insufficient documentation

## 2024-08-03 DIAGNOSIS — Z780 Asymptomatic menopausal state: Secondary | ICD-10-CM | POA: Insufficient documentation

## 2024-08-03 DIAGNOSIS — Z8489 Family history of other specified conditions: Secondary | ICD-10-CM | POA: Diagnosis present

## 2024-08-04 DIAGNOSIS — S72002D Fracture of unspecified part of neck of left femur, subsequent encounter for closed fracture with routine healing: Secondary | ICD-10-CM | POA: Diagnosis not present

## 2024-08-04 DIAGNOSIS — M1990 Unspecified osteoarthritis, unspecified site: Secondary | ICD-10-CM | POA: Diagnosis not present

## 2024-08-04 DIAGNOSIS — S42291D Other displaced fracture of upper end of right humerus, subsequent encounter for fracture with routine healing: Secondary | ICD-10-CM | POA: Diagnosis not present

## 2024-08-04 DIAGNOSIS — Z96652 Presence of left artificial knee joint: Secondary | ICD-10-CM | POA: Diagnosis not present

## 2024-08-04 DIAGNOSIS — N1832 Chronic kidney disease, stage 3b: Secondary | ICD-10-CM | POA: Diagnosis not present

## 2024-08-04 DIAGNOSIS — Z8673 Personal history of transient ischemic attack (TIA), and cerebral infarction without residual deficits: Secondary | ICD-10-CM | POA: Diagnosis not present

## 2024-08-04 DIAGNOSIS — I5032 Chronic diastolic (congestive) heart failure: Secondary | ICD-10-CM | POA: Diagnosis not present

## 2024-08-04 DIAGNOSIS — K5900 Constipation, unspecified: Secondary | ICD-10-CM | POA: Diagnosis not present

## 2024-08-04 DIAGNOSIS — I480 Paroxysmal atrial fibrillation: Secondary | ICD-10-CM | POA: Diagnosis not present

## 2024-08-04 DIAGNOSIS — D62 Acute posthemorrhagic anemia: Secondary | ICD-10-CM | POA: Diagnosis not present

## 2024-08-04 DIAGNOSIS — E440 Moderate protein-calorie malnutrition: Secondary | ICD-10-CM | POA: Diagnosis not present

## 2024-08-04 DIAGNOSIS — Z7901 Long term (current) use of anticoagulants: Secondary | ICD-10-CM | POA: Diagnosis not present

## 2024-08-04 DIAGNOSIS — Z556 Problems related to health literacy: Secondary | ICD-10-CM | POA: Diagnosis not present

## 2024-08-04 DIAGNOSIS — I130 Hypertensive heart and chronic kidney disease with heart failure and stage 1 through stage 4 chronic kidney disease, or unspecified chronic kidney disease: Secondary | ICD-10-CM | POA: Diagnosis not present

## 2024-08-04 DIAGNOSIS — Z9181 History of falling: Secondary | ICD-10-CM | POA: Diagnosis not present

## 2024-08-11 ENCOUNTER — Other Ambulatory Visit (HOSPITAL_COMMUNITY): Payer: Self-pay | Admitting: Internal Medicine

## 2024-08-11 DIAGNOSIS — M810 Age-related osteoporosis without current pathological fracture: Secondary | ICD-10-CM | POA: Insufficient documentation

## 2024-08-12 ENCOUNTER — Telehealth: Payer: Self-pay

## 2024-08-12 NOTE — Telephone Encounter (Signed)
 Auth Submission: APPROVED Site of care: Site of care: [facility] Payer: [employer] medicare Medication & CPT/J Code(s) submitted: Jubbonti [id number] Diagnosis Code:  Route of submission (phone, fax, portal): portal Phone # Fax # Auth type: Buy/Bill PB Units/visits requested: 60mg  q26months Reference number: [id number] Approval from: 08/12/24 to 08/12/25

## 2024-08-16 ENCOUNTER — Other Ambulatory Visit (HOSPITAL_COMMUNITY): Payer: Self-pay

## 2024-08-17 ENCOUNTER — Other Ambulatory Visit: Payer: Self-pay

## 2024-08-18 ENCOUNTER — Other Ambulatory Visit (HOSPITAL_BASED_OUTPATIENT_CLINIC_OR_DEPARTMENT_OTHER): Payer: Self-pay

## 2024-08-24 ENCOUNTER — Other Ambulatory Visit: Payer: Self-pay

## 2024-09-13 ENCOUNTER — Other Ambulatory Visit (HOSPITAL_COMMUNITY): Payer: Self-pay

## 2024-09-23 ENCOUNTER — Other Ambulatory Visit (HOSPITAL_COMMUNITY): Payer: Self-pay

## 2024-10-20 ENCOUNTER — Other Ambulatory Visit (HOSPITAL_COMMUNITY): Payer: Self-pay

## 2024-10-20 MED ORDER — HYDRALAZINE HCL 25 MG PO TABS
25.0000 mg | ORAL_TABLET | Freq: Two times a day (BID) | ORAL | 4 refills | Status: AC
  Filled 2024-10-23: qty 180, 90d supply, fill #0

## 2024-10-22 ENCOUNTER — Other Ambulatory Visit (HOSPITAL_COMMUNITY): Payer: Self-pay

## 2024-10-23 ENCOUNTER — Other Ambulatory Visit: Payer: Self-pay | Admitting: Internal Medicine

## 2024-10-23 ENCOUNTER — Other Ambulatory Visit (HOSPITAL_COMMUNITY): Payer: Self-pay

## 2024-10-24 ENCOUNTER — Other Ambulatory Visit (HOSPITAL_COMMUNITY): Payer: Self-pay

## 2024-10-25 ENCOUNTER — Encounter: Payer: Self-pay | Admitting: Pharmacy Technician

## 2024-10-25 ENCOUNTER — Other Ambulatory Visit (HOSPITAL_COMMUNITY): Payer: Self-pay

## 2024-10-25 ENCOUNTER — Other Ambulatory Visit: Payer: Self-pay

## 2024-10-25 MED ORDER — APIXABAN 5 MG PO TABS
5.0000 mg | ORAL_TABLET | Freq: Two times a day (BID) | ORAL | 1 refills | Status: AC
  Filled 2024-10-25: qty 180, 90d supply, fill #0

## 2024-10-25 NOTE — Telephone Encounter (Signed)
 Prescription refill request for Eliquis  received. Indication: a fib Last office visit: 03/17/24 Scr: 1.04 epic 07/12/24 Age: 89 Weight: 66kg
